# Patient Record
Sex: Female | Born: 1985
Health system: Southern US, Community
[De-identification: ages and names within clinical notes are randomized; demographics above are authoritative.]

## PROBLEM LIST (undated history)

## (undated) ENCOUNTER — Inpatient Hospital Stay (HOSPITAL_COMMUNITY): Payer: Self-pay

## (undated) DIAGNOSIS — Z319 Encounter for procreative management, unspecified: Secondary | ICD-10-CM

## (undated) DIAGNOSIS — T7840XA Allergy, unspecified, initial encounter: Secondary | ICD-10-CM

## (undated) DIAGNOSIS — R51 Headache: Secondary | ICD-10-CM

## (undated) DIAGNOSIS — R519 Headache, unspecified: Secondary | ICD-10-CM

## (undated) DIAGNOSIS — J019 Acute sinusitis, unspecified: Secondary | ICD-10-CM

## (undated) DIAGNOSIS — E282 Polycystic ovarian syndrome: Secondary | ICD-10-CM

## (undated) DIAGNOSIS — E785 Hyperlipidemia, unspecified: Secondary | ICD-10-CM

## (undated) DIAGNOSIS — M722 Plantar fascial fibromatosis: Secondary | ICD-10-CM

## (undated) DIAGNOSIS — J3089 Other allergic rhinitis: Secondary | ICD-10-CM

## (undated) HISTORY — DX: Hyperlipidemia, unspecified: E78.5

## (undated) HISTORY — DX: Allergy, unspecified, initial encounter: T78.40XA

## (undated) HISTORY — PX: WISDOM TOOTH EXTRACTION: SHX21

## (undated) HISTORY — DX: Polycystic ovarian syndrome: E28.2

## (undated) HISTORY — DX: Encounter for procreative management, unspecified: Z31.9

---
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
## 2014-01-28 ENCOUNTER — Ambulatory Visit (INDEPENDENT_AMBULATORY_CARE_PROVIDER_SITE_OTHER): Payer: Self-pay | Admitting: Sports Medicine

## 2014-01-28 ENCOUNTER — Encounter: Payer: Self-pay | Admitting: Sports Medicine

## 2014-01-28 VITALS — BP 127/73 | HR 105 | Ht 62.0 in | Wt 157.0 lb

## 2014-01-28 DIAGNOSIS — M722 Plantar fascial fibromatosis: Secondary | ICD-10-CM

## 2014-01-28 HISTORY — DX: Plantar fascial fibromatosis: M72.2

## 2014-01-28 NOTE — Progress Notes (Signed)
  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 She has had plantar fascia injections in the past that have been minimally effective, she has really never had any rehabilitation, or cost orthotics. Pain is moderate, persistent.  Past medical history, Surgical history, Family history not pertinant except as noted below, Social history, Allergies, and medications have been entered into the medical record, reviewed, and no changes needed.   Review of Systems: No headache, visual changes, nausea, vomiting, diarrhea, constipation, dizziness, abdominal pain, skin rash, fevers, chills, night sweats, swollen lymph nodes, weight loss, chest pain, body aches, joint swelling, muscle aches, shortness of breath, mood changes, visual or auditory hallucinations.  Objective:    General: Well Developed, well nourished, and in no acute distress.  Neuro: Alert and oriented x3, extra-ocular muscles intact, sensation grossly intact.  HEENT: Normocephalic, atraumatic, pupils equal round reactive to light, neck supple, no masses, no lymphadenopathy, thyroid nonpalpable.  Skin: Warm and dry, no rashes noted.  Cardiac: Regular rate and rhythm, no murmurs rubs or gallops.  Respiratory: Clear to auscultation bilaterally. Not using accessory muscles, speaking in full sentences.  Abdominal: Soft, nontender, nondistended, positive bowel sounds, no masses, no organomegaly.  Bilateral Foot: No visible erythema or swelling. Range of motion is full in all directions. Strength is 5/5 in all directions. No hallux valgus. No pes cavus or pes planus. No abnormal callus noted. No pain over the navicular prominence, or base of fifth metatarsal. Tender to palpation of the calcaneal insertion of plantar  fascia. No pain at the Achilles insertion. No pain over the calcaneal bursa. No pain of the retrocalcaneal bursa. No tenderness to palpation over the tarsals, metatarsals, or phalanges. No hallux rigidus or limitus. No tenderness palpation over interphalangeal joints. No pain with compression of the metatarsal heads. Neurovascularly intact distally.  Patient was fitted for a : standard, cushioned, semi-rigid orthotic. The orthotic was heated and afterward the patient stood on the orthotic blank positioned on the orthotic stand. The patient was positioned in subtalar neutral position and 10 degrees of ankle dorsiflexion in a weight bearing stance. After completion of molding, a stable base was applied to the orthotic blank. The blank was ground to a stable position for weight bearing. Size: 6 Base: Blue EVA Additional Posting and Padding: None The patient ambulated these, and they were very comfortable.  Impression and Recommendations:    The patient was counselled, risk factors were discussed, anticipatory guidance given.

## 2014-01-28 NOTE — Assessment & Plan Note (Signed)
Orthotics as above, over-the-counter NSAIDs as needed, or rehabilitation given. Return as needed.

## 2014-04-07 DIAGNOSIS — E669 Obesity, unspecified: Secondary | ICD-10-CM | POA: Insufficient documentation

## 2014-04-07 DIAGNOSIS — L509 Urticaria, unspecified: Secondary | ICD-10-CM | POA: Insufficient documentation

## 2014-04-07 DIAGNOSIS — E66811 Obesity, class 1: Secondary | ICD-10-CM | POA: Insufficient documentation

## 2014-09-08 ENCOUNTER — Ambulatory Visit (INDEPENDENT_AMBULATORY_CARE_PROVIDER_SITE_OTHER): Payer: 59 | Admitting: Podiatry

## 2014-09-08 ENCOUNTER — Ambulatory Visit (INDEPENDENT_AMBULATORY_CARE_PROVIDER_SITE_OTHER): Payer: 59

## 2014-09-08 ENCOUNTER — Encounter: Payer: Self-pay | Admitting: Podiatry

## 2014-09-08 VITALS — BP 124/80 | HR 74 | Resp 12

## 2014-09-08 DIAGNOSIS — M722 Plantar fascial fibromatosis: Secondary | ICD-10-CM

## 2014-09-08 MED ORDER — DICLOFENAC SODIUM 75 MG PO TBEC: 75.0000 mg | DELAYED_RELEASE_TABLET | Freq: Two times a day (BID) | ORAL | Status: DC

## 2014-09-08 MED ORDER — TRIAMCINOLONE ACETONIDE 10 MG/ML IJ SUSP
10.0000 mg | Freq: Once | INTRAMUSCULAR | Status: AC
  Administered 2014-09-08: 10 mg

## 2014-09-08 NOTE — Progress Notes (Signed)
Subjective:     Patient ID: Dawn Wu, female   DOB: 02-Feb-1986, 28 y.o.   MRN: [id number]  HPIpatient states that she's had a lot of pain in both of her heels for several years with the right being worse and now the left one is really starting to bother her. She states she's unable to do activities once she leaves work and that this has been a big impact on her life style   Review of Systems  All other systems reviewed and are negative.      Objective:   Physical Exam  Constitutional: She is oriented to person, place, and time.  Cardiovascular: Intact distal pulses.   Musculoskeletal: Normal range of motion.  Neurological: She is oriented to person, place, and time.  Skin: Skin is warm.  Nursing note and vitals reviewed. neurovascular status found to be intact with muscle strength adequate and range of motion subtalar and midtarsal joint within normal limits. Patient is noted to have significant discomfort in the medial fascial band of both heels at the insertion of the tendon into the calcaneus with fluid buildup noted and moderate depression of the arch upon weightbearing. Digits are well-perfused and no equinus condition is noted     Assessment:     Significant plantar fasciitis-like symptoms in a young girl who has had 1 previous injection which helped for a period of time    Plan:     H&P and x-rays reviewed and today I injected the plantar fascia at each heel 3 mg Kenalog 5 mg Xylocaine and applied fascially brace right and placed on diclofenac 75 mg twice a day. Scanned for custom orthotics to reduce the chronic nature of her condition due to the long-term nature of problem

## 2014-09-08 NOTE — Progress Notes (Signed)
   Subjective:    Patient ID: Dawn Wu, female    DOB: 08/15/1986, 28 y.o.   MRN: [id number]  HPI Comments: Pt states her heels have hurt for 2 years, and 15 months she received an injection in the right heel.  Pt states she now has painfulness in both heels.     Review of Systems  Genitourinary:       Irregular menses.  All other systems reviewed and are negative.      Objective:   Physical Exam        Assessment & Plan:

## 2014-09-08 NOTE — Patient Instructions (Signed)

## 2014-09-12 ENCOUNTER — Ambulatory Visit: Payer: Self-pay | Admitting: Podiatry

## 2014-10-07 ENCOUNTER — Other Ambulatory Visit: Payer: 59

## 2014-10-14 ENCOUNTER — Ambulatory Visit: Payer: 59

## 2014-10-14 DIAGNOSIS — M722 Plantar fascial fibromatosis: Secondary | ICD-10-CM

## 2014-10-14 NOTE — Patient Instructions (Signed)

## 2014-10-14 NOTE — Progress Notes (Signed)
Pt is here to PUO 

## 2015-04-07 ENCOUNTER — Ambulatory Visit (INDEPENDENT_AMBULATORY_CARE_PROVIDER_SITE_OTHER): Payer: 59 | Admitting: Podiatry

## 2015-04-07 ENCOUNTER — Encounter: Payer: Self-pay | Admitting: Podiatry

## 2015-04-07 VITALS — BP 116/69 | HR 76 | Resp 15

## 2015-04-07 DIAGNOSIS — M722 Plantar fascial fibromatosis: Secondary | ICD-10-CM

## 2015-04-07 MED ORDER — TRIAMCINOLONE ACETONIDE 10 MG/ML IJ SUSP
10.0000 mg | Freq: Once | INTRAMUSCULAR | Status: AC
  Administered 2015-04-07: 10 mg

## 2015-04-07 NOTE — Progress Notes (Signed)
Subjective:     Patient ID: Dawn Wu, female   DOB: Jul 01, 1986, 29 y.o.   MRN: [id number]  HPI patient states I was doing well for around 4 months and then started to develop reoccurrence of my pain in my left heel. Patient states that she started running and it started to hurt and she is only been wearing orthotics at times due to making her feet achy at different times   Review of Systems     Objective:   Physical Exam Neurovascular status intact muscle strength is adequate with discomfort in the plantar heel medial band that's present especially after periods of sitting and when getting up in the morning    Assessment:     Reoccurrence plantar fasciitis left with inflammation and fluid buildup around the medial band    Plan:     Instructed on continued physical therapy and the importance of orthotics and reinjected the plantar fascial left 3 mg Kenalog 5 mg Xylocaine and went ahead and applied night splint with all instructions on usage

## 2015-04-19 ENCOUNTER — Encounter: Payer: Self-pay | Admitting: Dietician

## 2015-04-19 ENCOUNTER — Encounter: Payer: 59 | Attending: *Deleted | Admitting: Dietician

## 2015-04-19 VITALS — Ht 62.0 in | Wt 158.9 lb

## 2015-04-19 DIAGNOSIS — Z713 Dietary counseling and surveillance: Secondary | ICD-10-CM | POA: Insufficient documentation

## 2015-04-19 DIAGNOSIS — E663 Overweight: Secondary | ICD-10-CM | POA: Diagnosis present

## 2015-04-19 NOTE — Patient Instructions (Signed)
Aim to eat 3 meals per day and have 1-2 snacks if you are hungry. Have protein with a carbohydrate for each meal/snack. Eat meals at the table. Try to take 20 minutes to eat, chew about 20-30 x per bite, put your fork down, and consider switching hand to slow down.  If you are still hungry after 20 minutes, have seconds. Aim to fill half of your plate with vegetables at lunch and dinner meals. Have protein the size of the palm of your hand and starch on a quarter of your plate or what you can fit in your hand. At restaurants, choose one starch. Box up half of your portion in a to go box.

## 2015-04-19 NOTE — Progress Notes (Signed)
  Medical Nutrition Therapy:  Appt start time: 1655 end time:  1730.   Assessment:  Primary concerns today: Dawn Wu is here today since she has been trying to get healthier and would like to get her [facility] Weight Badge. Has started exercising, cut back on calories and having some supplemental shakes 5 x week. Was eating 3-4 meals per day that were heavy and had a lot of tortillas.   Has lost 11 lbs in the past 3 weeks. Would like to get weight down to 150 lbs. Ob/gyn would like her to lose weight since she had PCOS and would like to have a baby in the next year.  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 Lives with her parents. Her and her mom do the meal preparation at home. Not missing any meals. Eating out 1-2 meals per week.   Preferred Learning Style:   No preference indicated   Learning Readiness:   Ready  MEDICATIONS: see list    DIETARY INTAKE:  Usual eating pattern includes 2-3 meals and 0-1 snacks per day.  Avoided foods include: shellfish, raw onions/tomatoes   24-hr recall:  B ( AM): Shake (Usana) with water   Snk ( AM): might have apple or orange or almonds  L ( PM): grilled chicken or burger or chicken stew with tortillas Snk ( PM): none D ( PM): Shake (Usana) with water or chicken, meat with beans or a salad or skips Snk ( PM): none Beverages: shakes, water, less than 1 x week sweet tea or soda  Usual physical activity: Zumba 3-4 x week  Estimated energy needs: 2000 calories 225 g carbohydrates 150 g protein 56 g fat  Progress Towards Goal(s):  In progress.   Nutritional Diagnosis:  [place]-3.3 Overweight/obesity As related to hx of large portion sizes and excess carbohydrates.  As evidenced by BMI of 29.1.    Intervention:  Nutrition counseling provided. Plan: Aim to eat 3 meals per day and have 1-2 snacks if you are hungry. Have protein with a carbohydrate for each meal/snack. Eat meals at the table. Try to take 20 minutes to eat, chew about 20-30 x per  bite, put your fork down, and consider switching hand to slow down.  If you are still hungry after 20 minutes, have seconds. Aim to fill half of your plate with vegetables at lunch and dinner meals. Have protein the size of the palm of your hand and starch on a quarter of your plate or what you can fit in your hand. At restaurants, choose one starch. Box up half of your portion in a to go box.  Teaching Method Utilized:  Visual Auditory Hands on  Handouts given during visit include:  MyPlate Handout  15 g CHO Snacks  Barriers to learning/adherence to lifestyle change: none  Demonstrated degree of understanding via:  Teach Back   Monitoring/Evaluation:  Dietary intake, exercise, and body weight prn.

## 2015-07-15 DIAGNOSIS — J309 Allergic rhinitis, unspecified: Principal | ICD-10-CM

## 2015-07-15 DIAGNOSIS — H1010 Acute atopic conjunctivitis, unspecified eye: Secondary | ICD-10-CM | POA: Insufficient documentation

## 2015-08-09 ENCOUNTER — Ambulatory Visit (INDEPENDENT_AMBULATORY_CARE_PROVIDER_SITE_OTHER): Payer: 59

## 2015-08-09 DIAGNOSIS — J301 Allergic rhinitis due to pollen: Secondary | ICD-10-CM

## 2015-10-25 ENCOUNTER — Encounter: Payer: Self-pay | Admitting: Podiatry

## 2015-10-25 ENCOUNTER — Encounter (INDEPENDENT_AMBULATORY_CARE_PROVIDER_SITE_OTHER): Payer: Self-pay

## 2015-10-25 ENCOUNTER — Encounter: Payer: Self-pay | Admitting: Allergy and Immunology

## 2015-11-05 NOTE — L&D Delivery Note (Signed)
Delivery Note [age] y.o. G1P0 at 7449w5d who presented in labor. Augmented with AROM and pitocin. No intrapartum complications.  At 4:12 PM a viable female was delivered via Vaginal, Spontaneous Delivery (Presentation:cephalic;ROA  ).  APGAR: 8, 9; weight 7 lb 6.2 oz (3350 g).  Had loose nuchal cord x 1. Placenta status: delivered spontaneously, intact.   Anesthesia:  Epidural Episiotomy:  None Lacerations: 2nd degree perineal and R periurethral repaired in usual fashion with 2-0 Vircyl and 3-0 Vicryl respectively.  Est. Blood Loss (mL): 200  Mom to postpartum.  Baby to Couplet care / Skin to Skin.   Jaynie CollinsUGONNA  Rufino Staup, MD, FACOG Attending Obstetrician & Gynecologist, [facility], [facility]

## 2015-11-13 DIAGNOSIS — N926 Irregular menstruation, unspecified: Secondary | ICD-10-CM | POA: Diagnosis not present

## 2015-11-16 DIAGNOSIS — Z3201 Encounter for pregnancy test, result positive: Secondary | ICD-10-CM | POA: Diagnosis not present

## 2015-12-01 DIAGNOSIS — Z36 Encounter for antenatal screening of mother: Secondary | ICD-10-CM | POA: Diagnosis not present

## 2015-12-13 DIAGNOSIS — Z3401 Encounter for supervision of normal first pregnancy, first trimester: Secondary | ICD-10-CM | POA: Diagnosis not present

## 2015-12-15 LAB — OB RESULTS CONSOLE RUBELLA ANTIBODY, IGM: Rubella: IMMUNE

## 2015-12-15 LAB — OB RESULTS CONSOLE ANTIBODY SCREEN: ANTIBODY SCREEN: NEGATIVE

## 2015-12-15 LAB — OB RESULTS CONSOLE PLATELET COUNT: PLATELETS: 280 10*3/uL

## 2015-12-15 LAB — OB RESULTS CONSOLE ABO/RH: RH Type: POSITIVE

## 2015-12-15 LAB — OB RESULTS CONSOLE HGB/HCT, BLOOD
HCT: 40 %
Hemoglobin: 13.3 g/dL

## 2015-12-15 LAB — OB RESULTS CONSOLE HEPATITIS B SURFACE ANTIGEN: Hepatitis B Surface Ag: NEGATIVE

## 2015-12-15 LAB — OB RESULTS CONSOLE RPR: RPR: NONREACTIVE

## 2015-12-15 LAB — OB RESULTS CONSOLE VARICELLA ZOSTER ANTIBODY, IGG: VARICELLA IGG: IMMUNE

## 2015-12-15 LAB — OB RESULTS CONSOLE HIV ANTIBODY (ROUTINE TESTING): HIV: NONREACTIVE

## 2015-12-27 DIAGNOSIS — Z3490 Encounter for supervision of normal pregnancy, unspecified, unspecified trimester: Secondary | ICD-10-CM | POA: Diagnosis not present

## 2015-12-27 DIAGNOSIS — Z01419 Encounter for gynecological examination (general) (routine) without abnormal findings: Secondary | ICD-10-CM | POA: Diagnosis not present

## 2015-12-28 DIAGNOSIS — R87610 Atypical squamous cells of undetermined significance on cytologic smear of cervix (ASC-US): Secondary | ICD-10-CM | POA: Diagnosis not present

## 2015-12-28 DIAGNOSIS — Z01419 Encounter for gynecological examination (general) (routine) without abnormal findings: Secondary | ICD-10-CM | POA: Diagnosis not present

## 2015-12-28 LAB — IGP,CTNGTV,RFX APTIMA HPV ASCU
Chlamydia, Nuc. Acid Amp: NEGATIVE
GONOCOCCUS, NUC. ACID AMP: NEGATIVE
HPV Aptima: NEGATIVE

## 2016-01-08 DIAGNOSIS — Z36 Encounter for antenatal screening of mother: Secondary | ICD-10-CM | POA: Diagnosis not present

## 2016-01-22 ENCOUNTER — Encounter: Payer: Self-pay | Admitting: Advanced Practice Midwife

## 2016-01-22 ENCOUNTER — Encounter: Payer: Self-pay | Admitting: *Deleted

## 2016-01-22 ENCOUNTER — Ambulatory Visit (INDEPENDENT_AMBULATORY_CARE_PROVIDER_SITE_OTHER): Payer: 59 | Admitting: Advanced Practice Midwife

## 2016-01-22 VITALS — BP 131/71 | HR 97 | Wt 168.0 lb

## 2016-01-22 DIAGNOSIS — R87610 Atypical squamous cells of undetermined significance on cytologic smear of cervix (ASC-US): Secondary | ICD-10-CM | POA: Insufficient documentation

## 2016-01-22 DIAGNOSIS — Z3482 Encounter for supervision of other normal pregnancy, second trimester: Secondary | ICD-10-CM

## 2016-01-22 DIAGNOSIS — Z36 Encounter for antenatal screening of mother: Secondary | ICD-10-CM

## 2016-01-22 DIAGNOSIS — Z363 Encounter for antenatal screening for malformations: Secondary | ICD-10-CM

## 2016-01-22 NOTE — Progress Notes (Signed)
Last pap 2/17 Ascus.

## 2016-01-22 NOTE — Patient Instructions (Signed)

## 2016-01-22 NOTE — Progress Notes (Signed)
Subjective:  Dawn Wu is a 30 y.o. G1P0 at 7140w0d being seen today for NOB transfer from [place].  Pt is a Producer, [employer] employee.  She is currently monitored for the following issues for this low-risk pregnancy and has Plantar fasciitis; Allergic conjunctivitis and rhinitis; Supervision of normal pregnancy, antepartum; Class 1 obesity; and Atypical squamous cells of undetermined significance (ASCUS) on Papanicolaou smear of cervix on her problem list.  Patient reports Daily allergy Sx and associated morning HA's.  Used to use Nasocort w/ good relief, but stopped sue to pregnancy.   . Vag. Bleeding: None.   . Denies leaking of fluid.   The following portions of the patient's history were reviewed and updated as appropriate: allergies, current medications, past family history, past medical history, past social history, past surgical history and problem list. Problem list updated.  FH diabetes  Objective:   Filed Vitals:   01/22/16 0843  BP: 131/71  Pulse: 97  Weight: 168 lb (76.204 kg)    Fetal Status: Fetal Heart Rate (bpm): 145         General:  Alert, oriented and cooperative. Patient is in no acute distress.  Skin: Skin is warm and dry. No rash noted.   Cardiovascular: Normal heart rate noted  Respiratory: Normal respiratory effort, no problems with respiration noted  Abdomen: Soft, gravid, appropriate for gestational age.       Pelvic: Vag. Bleeding: None Vag D/C Character: Thin   Cervical exam deferred        Extremities: Normal range of motion.     Mental Status: Normal mood and affect. Normal behavior. Normal judgment and thought content.   Urinalysis: Urine Protein: Negative Urine Glucose: 1+  Assessment and Plan:  Pregnancy: G1P0 at 6840w0d  1. Supervision of normal pregnancy, antepartum, second trimester  - US MFM OB COMP + 14 WK; Future  2. Atypical squamous cells of undetermined significance (ASCUS) on Papanicolaou smear of cervix - Routine screening  3. Antenatal  screening for malformation using ultrasonics  - US MFM OB COMP + 14 WK; Future  4. Glycosuria -Early 1 hour GTT  Preterm labor symptoms and general obstetric precautions including but not limited to vaginal bleeding, contractions, leaking of fluid and fetal movement were reviewed in detail with the patient. Please refer to After Visit Summary for other counseling recommendations.  Return in about 4 weeks (around 02/19/2016).   Dorathy KinsmanVirginia Welton Bord, CNM

## 2016-02-07 DIAGNOSIS — L723 Sebaceous cyst: Secondary | ICD-10-CM | POA: Diagnosis not present

## 2016-02-07 MED FILL — CLINDAMYCIN-BENZOYL PEROX 1: 1-5 | 30 days supply | Qty: 25 | Fill #0

## 2016-02-23 ENCOUNTER — Encounter: Payer: 59 | Admitting: Family

## 2016-02-23 ENCOUNTER — Ambulatory Visit (INDEPENDENT_AMBULATORY_CARE_PROVIDER_SITE_OTHER): Payer: 59 | Admitting: Family

## 2016-02-23 VITALS — BP 114/74 | HR 88 | Wt 168.0 lb

## 2016-02-23 DIAGNOSIS — Z36 Encounter for antenatal screening of mother: Secondary | ICD-10-CM

## 2016-02-23 DIAGNOSIS — Z3490 Encounter for supervision of normal pregnancy, unspecified, unspecified trimester: Secondary | ICD-10-CM | POA: Diagnosis not present

## 2016-02-23 DIAGNOSIS — Z3480 Encounter for supervision of other normal pregnancy, unspecified trimester: Secondary | ICD-10-CM | POA: Diagnosis not present

## 2016-02-23 DIAGNOSIS — Z3402 Encounter for supervision of normal first pregnancy, second trimester: Secondary | ICD-10-CM

## 2016-02-23 DIAGNOSIS — Z3482 Encounter for supervision of other normal pregnancy, second trimester: Secondary | ICD-10-CM

## 2016-02-23 NOTE — Progress Notes (Signed)
Subjective:  Dawn Wu is a 30 y.o. G1P0 at 465w4d being seen today for ongoing prenatal care.  She is currently monitored for the following issues for this low-risk pregnancy and has Plantar fasciitis; Allergic conjunctivitis and rhinitis; Supervision of normal pregnancy, antepartum; Class 1 obesity; and Atypical squamous cells of undetermined significance (ASCUS) on Papanicolaou smear of cervix on her problem list.  Patient reports intermittent lower back pain while standing at work.  no report of UTI symptoms.  .   Lockie Pares. Vag. Bleeding: None.  Movement: Absent. Denies leaking of fluid.   The following portions of the patient's history were reviewed and updated as appropriate: allergies, current medications, past family history, past medical history, past social history, past surgical history and problem list. Problem list updated.  Objective:   Filed Vitals:   02/23/16 0820  BP: 114/74  Pulse: 88  Weight: 168 lb (76.204 kg)    Fetal Status: Fetal Heart Rate (bpm): 143   Movement: Absent   Fundal height 19  General:  Alert, oriented and cooperative. Patient is in no acute distress.  Skin: Skin is warm and dry. No rash noted.   Cardiovascular: Normal heart rate noted  Respiratory: Normal respiratory effort, no problems with respiration noted  Abdomen: Soft, gravid, appropriate for gestational age. Pain/Pressure: Absent     Pelvic: Vag. Bleeding: None Vag D/C Character: Thin   Cervical exam deferred        Extremities: Normal range of motion.     Mental Status: Normal mood and affect. Normal behavior. Normal judgment and thought content.   Urinalysis: Urine Protein: Negative Urine Glucose: Trace  Assessment and Plan:  Pregnancy: G1P0 at 5565w4d  1. Prenatal care, unspecified trimester - Glucose Tolerance, 1 HR (50g) - Alpha fetoprotein, maternal - Anatomy ultrasound scheduled for Monday  2.  Back Pain in Pregnancy - Discussed use of pregnancy band - OTC Tylenol as  needed  General obstetric precautions including but not limited to vaginal bleeding and pelvic pain reviewed in detail with the patient. Please refer to After Visit Summary for other counseling recommendations.  Return in about 4 weeks (around 03/22/2016).   Eino FarberWalidah Kennith GainN Karim, CNM

## 2016-02-23 NOTE — Progress Notes (Signed)
Patient doing early glucola today. Armandina StammerJennifer Lacreshia Bondarenko RN BSN

## 2016-02-24 LAB — GLUCOSE TOLERANCE, 1 HOUR (50G) W/O FASTING: GLUCOSE, 1 HR, GESTATIONAL: 133 mg/dL (ref <140)

## 2016-02-26 ENCOUNTER — Encounter: Payer: 59 | Admitting: Advanced Practice Midwife

## 2016-02-26 ENCOUNTER — Other Ambulatory Visit: Payer: Self-pay | Admitting: Advanced Practice Midwife

## 2016-02-26 ENCOUNTER — Ambulatory Visit (HOSPITAL_COMMUNITY)
Admission: RE | Admit: 2016-02-26 | Discharge: 2016-02-26 | Disposition: A | Discharge status: Home / Self Care | Payer: 59 | Source: Ambulatory Visit | Attending: Advanced Practice Midwife | Admitting: Advanced Practice Midwife

## 2016-02-26 ENCOUNTER — Telehealth: Payer: Self-pay | Admitting: *Deleted

## 2016-02-26 DIAGNOSIS — Z363 Encounter for antenatal screening for malformations: Secondary | ICD-10-CM

## 2016-02-26 DIAGNOSIS — Z3482 Encounter for supervision of other normal pregnancy, second trimester: Secondary | ICD-10-CM

## 2016-02-26 DIAGNOSIS — Z3A19 19 weeks gestation of pregnancy: Secondary | ICD-10-CM | POA: Insufficient documentation

## 2016-02-26 DIAGNOSIS — Z36 Encounter for antenatal screening of mother: Secondary | ICD-10-CM | POA: Diagnosis not present

## 2016-02-26 NOTE — Telephone Encounter (Signed)
LM on voicemail of normal early 1 hr GTT of 133

## 2016-02-27 ENCOUNTER — Telehealth: Payer: Self-pay | Admitting: *Deleted

## 2016-02-27 DIAGNOSIS — Z3482 Encounter for supervision of other normal pregnancy, second trimester: Secondary | ICD-10-CM

## 2016-02-27 LAB — ALPHA FETOPROTEIN, MATERNAL
AFP: 31.5 ng/mL
Curr Gest Age: 18.6 weeks
MoM for AFP: 0.76
OPEN SPINA BIFIDA: NEGATIVE

## 2016-02-27 NOTE — Telephone Encounter (Signed)
Pt notified of normal AFP. 

## 2016-03-04 MED FILL — EPINEPHRINE 0.3 MG AUTO-INJ: 0.3 | 1 days supply | Qty: 2 | Fill #1

## 2016-03-20 ENCOUNTER — Ambulatory Visit (INDEPENDENT_AMBULATORY_CARE_PROVIDER_SITE_OTHER): Payer: 59 | Admitting: Obstetrics & Gynecology

## 2016-03-20 VITALS — BP 109/72 | HR 94 | Wt 175.0 lb

## 2016-03-20 DIAGNOSIS — R87610 Atypical squamous cells of undetermined significance on cytologic smear of cervix (ASC-US): Secondary | ICD-10-CM

## 2016-03-20 DIAGNOSIS — E669 Obesity, unspecified: Secondary | ICD-10-CM

## 2016-03-20 DIAGNOSIS — Z3482 Encounter for supervision of other normal pregnancy, second trimester: Secondary | ICD-10-CM

## 2016-03-20 NOTE — Progress Notes (Signed)
Patient complaining of back and abdominal pain that started yesterday. Armandina StammerJennifer Vianey Caniglia RN BSN

## 2016-03-20 NOTE — Progress Notes (Signed)
Subjective:  Dawn Wu is a 30 y.o. G1P0 at 3031w2d being seen today for ongoing prenatal care.  She is currently monitored for the following issues for this low-risk pregnancy and has Plantar fasciitis; Allergic conjunctivitis and rhinitis; Supervision of normal pregnancy, antepartum; Class 1 obesity; and Atypical squamous cells of undetermined significance (ASCUS) on Papanicolaou smear of cervix on her problem list.  Patient reports round ligament pain.   . Vag. Bleeding: None.  Movement: Present. Denies leaking of fluid.   The following portions of the patient's history were reviewed and updated as appropriate: allergies, current medications, past family history, past medical history, past social history, past surgical history and problem list. Problem list updated.  Objective:   Filed Vitals:   03/20/16 1043  BP: 109/72  Pulse: 94  Weight: 175 lb (79.379 kg)    Fetal Status: Fetal Heart Rate (bpm): 135   Movement: Present     General:  Alert, oriented and cooperative. Patient is in no acute distress.  Skin: Skin is warm and dry. No rash noted.   Cardiovascular: Normal heart rate noted  Respiratory: Normal respiratory effort, no problems with respiration noted  Abdomen: Soft, gravid, appropriate for gestational age. Pain/Pressure: Present     Pelvic: Vag. Bleeding: None Vag D/C Character: Thin   Cervical exam deferred        Extremities: Normal range of motion.  Edema: None  Mental Status: Normal mood and affect. Normal behavior. Normal judgment and thought content.   Urinalysis: Urine Protein: Trace Urine Glucose: Negative  Assessment and Plan:  Pregnancy: G1P0 at 5931w2d  1. Atypical squamous cells of undetermined significance (ASCUS) on Papanicolaou smear of cervix   2. Supervision of normal pregnancy, antepartum, second trimester  - US MFM OB FOLLOW UP; Future  3. Class 1 obesity - repeat glucola at next visit  Preterm labor symptoms and general obstetric  precautions including but not limited to vaginal bleeding, contractions, leaking of fluid and fetal movement were reviewed in detail with the patient. Please refer to After Visit Summary for other counseling recommendations.   Return in about 4 weeks (around 04/17/2016) for glucola.   Allie BossierMyra C Jozlin Bently, MD

## 2016-03-22 ENCOUNTER — Encounter: Payer: 59 | Admitting: Family

## 2016-04-15 ENCOUNTER — Other Ambulatory Visit: Payer: Self-pay | Admitting: Obstetrics & Gynecology

## 2016-04-15 ENCOUNTER — Ambulatory Visit (HOSPITAL_COMMUNITY)
Admission: RE | Admit: 2016-04-15 | Discharge: 2016-04-15 | Disposition: A | Discharge status: Home / Self Care | Payer: 59 | Source: Ambulatory Visit | Attending: Obstetrics & Gynecology | Admitting: Obstetrics & Gynecology

## 2016-04-15 DIAGNOSIS — Z3A26 26 weeks gestation of pregnancy: Secondary | ICD-10-CM

## 2016-04-15 DIAGNOSIS — Z0489 Encounter for examination and observation for other specified reasons: Secondary | ICD-10-CM

## 2016-04-15 DIAGNOSIS — IMO0002 Reserved for concepts with insufficient information to code with codable children: Secondary | ICD-10-CM

## 2016-04-15 DIAGNOSIS — Z36 Encounter for antenatal screening of mother: Secondary | ICD-10-CM | POA: Insufficient documentation

## 2016-04-15 DIAGNOSIS — Z3482 Encounter for supervision of other normal pregnancy, second trimester: Secondary | ICD-10-CM

## 2016-04-16 ENCOUNTER — Ambulatory Visit (HOSPITAL_COMMUNITY): Payer: 59

## 2016-04-17 ENCOUNTER — Ambulatory Visit (INDEPENDENT_AMBULATORY_CARE_PROVIDER_SITE_OTHER): Payer: 59 | Admitting: Obstetrics and Gynecology

## 2016-04-17 ENCOUNTER — Encounter: Payer: Self-pay | Admitting: Obstetrics and Gynecology

## 2016-04-17 VITALS — BP 124/70 | HR 103 | Wt 178.0 lb

## 2016-04-17 DIAGNOSIS — Z23 Encounter for immunization: Secondary | ICD-10-CM

## 2016-04-17 DIAGNOSIS — Z36 Encounter for antenatal screening of mother: Secondary | ICD-10-CM

## 2016-04-17 DIAGNOSIS — Z3482 Encounter for supervision of other normal pregnancy, second trimester: Secondary | ICD-10-CM

## 2016-04-17 DIAGNOSIS — Z3492 Encounter for supervision of normal pregnancy, unspecified, second trimester: Secondary | ICD-10-CM

## 2016-04-17 DIAGNOSIS — Z3480 Encounter for supervision of other normal pregnancy, unspecified trimester: Secondary | ICD-10-CM | POA: Diagnosis not present

## 2016-04-17 LAB — CBC
HCT: 37.8 % (ref 35.0–45.0)
HEMOGLOBIN: 12.2 g/dL (ref 11.7–15.5)
MCH: 26.6 pg — AB (ref 27.0–33.0)
MCHC: 32.3 g/dL (ref 32.0–36.0)
MCV: 82.5 fL (ref 80.0–100.0)
MPV: 11.1 fL (ref 7.5–12.5)
Platelets: 203 10*3/uL (ref 140–400)
RBC: 4.58 MIL/uL (ref 3.80–5.10)
RDW: 14.2 % (ref 11.0–15.0)
WBC: 10.1 10*3/uL (ref 3.8–10.8)

## 2016-04-17 NOTE — Progress Notes (Signed)
Subjective:  Dawn Wu is a 30 y.o. G1P0 at 2223w2d being seen today for ongoing prenatal care.  She is currently monitored for the following issues for this low-risk pregnancy and has Plantar fasciitis; Allergic conjunctivitis and rhinitis; Supervision of normal pregnancy, antepartum; Class 1 obesity; and Atypical squamous cells of undetermined significance (ASCUS) on Papanicolaou smear of cervix on her problem list.  Patient reports no complaints.  Contractions: Not present. Vag. Bleeding: None.  Movement: Present. Denies leaking of fluid.   The following portions of the patient's history were reviewed and updated as appropriate: allergies, current medications, past family history, past medical history, past social history, past surgical history and problem list. Problem list updated.  Objective:   Filed Vitals:   04/17/16 0811  BP: 124/70  Pulse: 103  Weight: 178 lb (80.74 kg)    Fetal Status: Fetal Heart Rate (bpm): 141 Fundal Height: 27 cm Movement: Present     General:  Alert, oriented and cooperative. Patient is in no acute distress.  Skin: Skin is warm and dry. No rash noted.   Cardiovascular: Normal heart rate noted  Respiratory: Normal respiratory effort, no problems with respiration noted  Abdomen: Soft, gravid, appropriate for gestational age. Pain/Pressure: Absent     Pelvic: Cervical exam deferred        Extremities: Normal range of motion.  Edema: Trace  Mental Status: Normal mood and affect. Normal behavior. Normal judgment and thought content.   Urinalysis: Urine Protein: Trace Urine Glucose: Negative  Assessment and Plan:  Pregnancy: G1P0 at 3823w2d  1. Supervision of normal pregnancy, antepartum, second trimester Patient is doing well without complaints. Reviewed 04/15/16 ultrasound results  Third trimester labs today - Glucose Tolerance, 1 HR (50g) - RPR - HIV antibody (with reflex) - CBC - Tdap vaccine greater than or equal to 7yo IM  2. Normal  pregnancy, second trimester  - Glucose Tolerance, 1 HR (50g) - RPR - HIV antibody (with reflex) - CBC - Tdap vaccine greater than or equal to 7yo IM  Preterm labor symptoms and general obstetric precautions including but not limited to vaginal bleeding, contractions, leaking of fluid and fetal movement were reviewed in detail with the patient. Please refer to After Visit Summary for other counseling recommendations.  Return in about 2 weeks (around 05/01/2016).   Catalina AntiguaPeggy Chazlyn Cude, MD

## 2016-04-18 LAB — RPR

## 2016-04-18 LAB — HIV ANTIBODY (ROUTINE TESTING W REFLEX): HIV: NONREACTIVE

## 2016-04-20 LAB — GLUCOSE TOLERANCE, 1 HOUR (50G) W/O FASTING: Glucose, 1 Hr, gestational: 156 mg/dL — ABNORMAL HIGH (ref <140)

## 2016-04-22 ENCOUNTER — Telehealth: Payer: Self-pay | Admitting: *Deleted

## 2016-04-22 DIAGNOSIS — Z3482 Encounter for supervision of other normal pregnancy, second trimester: Secondary | ICD-10-CM

## 2016-04-22 NOTE — Telephone Encounter (Signed)
Pt notified of abnormal 1 hr GTt and she is going to return this week for a fasting 3 hr GTT

## 2016-04-26 ENCOUNTER — Other Ambulatory Visit: Payer: 59

## 2016-04-26 DIAGNOSIS — R7309 Other abnormal glucose: Secondary | ICD-10-CM

## 2016-04-26 DIAGNOSIS — R7302 Impaired glucose tolerance (oral): Secondary | ICD-10-CM | POA: Diagnosis not present

## 2016-04-27 LAB — GLUCOSE TOLERANCE, 3 HOURS
GLUCOSE, 1 HOUR-GESTATIONAL: 174 mg/dL (ref <190)
GLUCOSE, 2 HOUR-GESTATIONAL: 144 mg/dL (ref <165)
GLUCOSE, FASTING-GESTATIONAL: 82 mg/dL (ref 65–104)
Glucose, GTT - 3 Hour: 100 mg/dL (ref <145)

## 2016-04-29 ENCOUNTER — Telehealth: Payer: Self-pay | Admitting: *Deleted

## 2016-04-29 DIAGNOSIS — Z3482 Encounter for supervision of other normal pregnancy, second trimester: Secondary | ICD-10-CM

## 2016-04-29 NOTE — Telephone Encounter (Signed)
Pt notified of normal 3 hr GTT. 

## 2016-05-06 ENCOUNTER — Ambulatory Visit (INDEPENDENT_AMBULATORY_CARE_PROVIDER_SITE_OTHER): Payer: 59 | Admitting: Obstetrics & Gynecology

## 2016-05-06 ENCOUNTER — Encounter: Payer: Self-pay | Admitting: Obstetrics & Gynecology

## 2016-05-06 VITALS — BP 121/80 | HR 100 | Wt 180.0 lb

## 2016-05-06 DIAGNOSIS — Z3483 Encounter for supervision of other normal pregnancy, third trimester: Secondary | ICD-10-CM

## 2016-05-06 NOTE — Patient Instructions (Signed)
Return to clinic for any scheduled appointments or obstetric concerns, or go to [facility] for evaluation  

## 2016-05-06 NOTE — Progress Notes (Signed)
Subjective:  Dawn Wu is a 30 y.o. G1P0 at 3175w0d being seen today for ongoing prenatal care.  She is currently monitored for the following issues for this low-risk pregnancy and has Plantar fasciitis; Allergic conjunctivitis and rhinitis; Supervision of normal pregnancy, antepartum; Class 1 obesity; and Atypical squamous cells of undetermined significance (ASCUS) on Papanicolaou smear of cervix on her problem list.  Patient reports no complaints.  Contractions: Not present. Vag. Bleeding: None.  Movement: Present. Denies leaking of fluid.  Requests letter for recusal from jury duty for a federal case that can last several weeks.  The following portions of the patient's history were reviewed and updated as appropriate: allergies, current medications, past family history, past medical history, past social history, past surgical history and problem list. Problem list updated.  Objective:   Filed Vitals:   05/06/16 1340  BP: 121/80  Pulse: 100  Weight: 180 lb (81.647 kg)    Fetal Status: Fetal Heart Rate (bpm): 134 Fundal Height: 29 cm Movement: Present     General:  Alert, oriented and cooperative. Patient is in no acute distress.  Skin: Skin is warm and dry. No rash noted.   Cardiovascular: Normal heart rate noted  Respiratory: Normal respiratory effort, no problems with respiration noted  Abdomen: Soft, gravid, appropriate for gestational age. Pain/Pressure: Present     Pelvic: Cervical exam deferred        Extremities: Normal range of motion.  Edema: Trace  Mental Status: Normal mood and affect. Normal behavior. Normal judgment and thought content.   Urinalysis: Urine Protein: Trace Urine Glucose: Negative  Assessment and Plan:  Pregnancy: G1P0 at 4175w0d  1. Supervision of normal pregnancy, antepartum, third trimester Jury excuse letter given. Preterm labor symptoms and general obstetric precautions including but not limited to vaginal bleeding, contractions, leaking of fluid  and fetal movement were reviewed in detail with the patient. Please refer to After Visit Summary for other counseling recommendations.  Return in about 2 weeks (around 05/20/2016) for OB Visit.   Tereso NewcomerUgonna A Anyanwu, MD

## 2016-05-13 ENCOUNTER — Other Ambulatory Visit: Payer: Self-pay

## 2016-05-13 MED ORDER — RANITIDINE HCL 150 MG PO TABS: ORAL_TABLET | ORAL | Status: DC

## 2016-05-13 MED FILL — raNITIdine HCL 150 MG TABS: 150 | 90 days supply | Qty: 90 | Fill #0

## 2016-05-20 ENCOUNTER — Ambulatory Visit (INDEPENDENT_AMBULATORY_CARE_PROVIDER_SITE_OTHER): Payer: 59 | Admitting: Advanced Practice Midwife

## 2016-05-20 VITALS — BP 127/72 | HR 104 | Wt 182.0 lb

## 2016-05-20 DIAGNOSIS — Z3483 Encounter for supervision of other normal pregnancy, third trimester: Secondary | ICD-10-CM

## 2016-05-20 NOTE — Patient Instructions (Signed)

## 2016-05-20 NOTE — Progress Notes (Signed)
Subjective:  Dawn Wu is a 30 y.o. G1P0 at 6670w0d being seen today for ongoing prenatal care.  She is currently monitored for the following issues for this low-risk pregnancy and has Plantar fasciitis; Allergic conjunctivitis and rhinitis; Supervision of normal pregnancy, antepartum; Class 1 obesity; and Atypical squamous cells of undetermined significance (ASCUS) on Papanicolaou smear of cervix on her problem list.  Patient reports occasional contractions.  Contractions: Not present. Vag. Bleeding: None.  Movement: Absent. Denies leaking of fluid.   The following portions of the patient's history were reviewed and updated as appropriate: allergies, current medications, past family history, past medical history, past social history, past surgical history and problem list. Problem list updated.  Objective:   Filed Vitals:   05/20/16 0847  BP: 127/72  Pulse: 104  Weight: 182 lb (82.555 kg)    Fetal Status: Fetal Heart Rate (bpm): 132   Movement: Absent     General:  Alert, oriented and cooperative. Patient is in no acute distress.  Skin: Skin is warm and dry. No rash noted.   Cardiovascular: Normal heart rate noted  Respiratory: Normal respiratory effort, no problems with respiration noted  Abdomen: Soft, gravid, appropriate for gestational age. Pain/Pressure: Present     Pelvic:  Cervical exam deferred        Extremities: Normal range of motion.  Edema: Trace  Mental Status: Normal mood and affect. Normal behavior. Normal judgment and thought content.   Urinalysis: Urine Protein: Trace Urine Glucose: Trace  Assessment and Plan:  Pregnancy: G1P0 at 670w0d  1. Supervision of normal pregnancy, antepartum, third trimester   Preterm labor symptoms and general obstetric precautions including but not limited to vaginal bleeding, contractions, leaking of fluid and fetal movement were reviewed in detail with the patient. Please refer to After Visit Summary for other counseling  recommendations.  Maternity support belt Is signed up for [facility].  Return in about 2 weeks (around 06/03/2016).   Dorathy KinsmanVirginia Caleb Decock, CNM

## 2016-05-21 ENCOUNTER — Encounter: Payer: Self-pay | Admitting: *Deleted

## 2016-05-24 ENCOUNTER — Inpatient Hospital Stay (HOSPITAL_COMMUNITY)
Admission: AD | Admit: 2016-05-24 | Discharge: 2016-05-24 | Disposition: A | Discharge status: Home / Self Care | Payer: 59 | Source: Ambulatory Visit | Attending: Obstetrics & Gynecology | Admitting: Obstetrics & Gynecology

## 2016-05-24 ENCOUNTER — Encounter (HOSPITAL_COMMUNITY): Payer: Self-pay | Admitting: *Deleted

## 2016-05-24 DIAGNOSIS — R51 Headache: Secondary | ICD-10-CM | POA: Insufficient documentation

## 2016-05-24 DIAGNOSIS — R519 Headache, unspecified: Secondary | ICD-10-CM

## 2016-05-24 DIAGNOSIS — O99283 Endocrine, nutritional and metabolic diseases complicating pregnancy, third trimester: Secondary | ICD-10-CM | POA: Diagnosis not present

## 2016-05-24 DIAGNOSIS — R42 Dizziness and giddiness: Secondary | ICD-10-CM | POA: Diagnosis not present

## 2016-05-24 DIAGNOSIS — O3483 Maternal care for other abnormalities of pelvic organs, third trimester: Secondary | ICD-10-CM | POA: Diagnosis not present

## 2016-05-24 DIAGNOSIS — E785 Hyperlipidemia, unspecified: Secondary | ICD-10-CM | POA: Diagnosis not present

## 2016-05-24 DIAGNOSIS — H538 Other visual disturbances: Secondary | ICD-10-CM | POA: Diagnosis not present

## 2016-05-24 DIAGNOSIS — Z3A31 31 weeks gestation of pregnancy: Secondary | ICD-10-CM | POA: Diagnosis not present

## 2016-05-24 DIAGNOSIS — O26893 Other specified pregnancy related conditions, third trimester: Secondary | ICD-10-CM | POA: Insufficient documentation

## 2016-05-24 DIAGNOSIS — E282 Polycystic ovarian syndrome: Secondary | ICD-10-CM | POA: Diagnosis not present

## 2016-05-24 HISTORY — DX: Headache: R51

## 2016-05-24 HISTORY — DX: Headache, unspecified: R51.9

## 2016-05-24 LAB — CBC
HCT: 37.4 % (ref 36.0–46.0)
HEMOGLOBIN: 12.5 g/dL (ref 12.0–15.0)
MCH: 26.5 pg (ref 26.0–34.0)
MCHC: 33.4 g/dL (ref 30.0–36.0)
MCV: 79.4 fL (ref 78.0–100.0)
PLATELETS: 178 10*3/uL (ref 150–400)
RBC: 4.71 MIL/uL (ref 3.87–5.11)
RDW: 14.8 % (ref 11.5–15.5)
WBC: 11.5 10*3/uL — AB (ref 4.0–10.5)

## 2016-05-24 LAB — COMPREHENSIVE METABOLIC PANEL
ALT: 27 U/L (ref 14–54)
AST: 24 U/L (ref 15–41)
Albumin: 3.1 g/dL — ABNORMAL LOW (ref 3.5–5.0)
Alkaline Phosphatase: 102 U/L (ref 38–126)
Anion gap: 9 (ref 5–15)
BUN: 11 mg/dL (ref 6–20)
CHLORIDE: 105 mmol/L (ref 101–111)
CO2: 20 mmol/L — ABNORMAL LOW (ref 22–32)
CREATININE: 0.55 mg/dL (ref 0.44–1.00)
Calcium: 9.1 mg/dL (ref 8.9–10.3)
Glucose, Bld: 95 mg/dL (ref 65–99)
Potassium: 3.7 mmol/L (ref 3.5–5.1)
Sodium: 134 mmol/L — ABNORMAL LOW (ref 135–145)
Total Bilirubin: <0.1 mg/dL — ABNORMAL LOW (ref 0.3–1.2)
Total Protein: 6.6 g/dL (ref 6.5–8.1)

## 2016-05-24 LAB — URINALYSIS, ROUTINE W REFLEX MICROSCOPIC
BILIRUBIN URINE: NEGATIVE
Glucose, UA: 250 mg/dL — AB
Hgb urine dipstick: NEGATIVE
KETONES UR: NEGATIVE mg/dL
LEUKOCYTES UA: NEGATIVE
NITRITE: NEGATIVE
Protein, ur: NEGATIVE mg/dL
Specific Gravity, Urine: 1.01 (ref 1.005–1.030)
pH: 6 (ref 5.0–8.0)

## 2016-05-24 MED ORDER — DEXAMETHASONE SODIUM PHOSPHATE 10 MG/ML IJ SOLN
10.0000 mg | Freq: Once | INTRAMUSCULAR | Status: AC
  Administered 2016-05-24: 10 mg via INTRAVENOUS
  Filled 2016-05-24: qty 1

## 2016-05-24 MED ORDER — DIPHENHYDRAMINE HCL 50 MG/ML IJ SOLN
25.0000 mg | Freq: Once | INTRAMUSCULAR | Status: AC
  Administered 2016-05-24: 25 mg via INTRAVENOUS
  Filled 2016-05-24: qty 1

## 2016-05-24 MED ORDER — LACTATED RINGERS IV BOLUS (SEPSIS)
1000.0000 mL | Freq: Once | INTRAVENOUS | Status: AC
  Administered 2016-05-24: 1000 mL via INTRAVENOUS

## 2016-05-24 MED ORDER — METOCLOPRAMIDE HCL 5 MG/ML IJ SOLN
10.0000 mg | Freq: Once | INTRAMUSCULAR | Status: AC
  Administered 2016-05-24: 10 mg via INTRAVENOUS
  Filled 2016-05-24: qty 2

## 2016-05-24 NOTE — MAU Note (Addendum)
C/o dizziness,blurred vision and feeling shaky since 1500 today; c/o lower back pain since noon; c/o also of headache @1500 ; hx of migraines; air conditioning not working well at work and pt had an extremely busy day nad on her feet all day; back pain is much better when pt is resting;

## 2016-05-24 NOTE — MAU Provider Note (Signed)
Chief Complaint:  Dizziness; Back Pain; Headache; and Blurred Vision   None     HPI: Dawn Wu is a 29 y.o. G1P0 at [redacted]w[redacted]d pt of [facility] who presents to maternity admissions reporting an episode today at work of dizziness, headache, shakiness, and blurred vision that has improved, but not resolved by the time she arrived in MAU.  She reports she was standing at work when the symptoms started and they improved some after sitting down but have not resolved. She drank a little water and Gatorade after the symptoms started and this seemed to help a little also.  She ate and drank normally today, and had a normal lunch 2-3 hours before the symptoms started.  She has a history of migraines but this does not feel like her usual migraine symptoms. The h/a is frontal, throbbing, and unchanged since onset. She reports good fetal movement, denies LOF, vaginal bleeding, vaginal itching/burning, urinary symptoms, n/v, or fever/chills.    HPI  Past Medical History: Past Medical History  Diagnosis Date  . Hyperlipidemia   . PCOS (polycystic ovarian syndrome)   . Infertility management   . Headache     Past obstetric history: OB History  Gravida Para Term Preterm AB SAB TAB Ectopic Multiple Living  1             # Outcome Date GA Lbr Len/2nd Weight Sex Delivery Anes PTL Lv  1 Current               Past Surgical History: Past Surgical History  Procedure Laterality Date  . Wisdom tooth extraction      Family History: Family History  Problem Relation Age of Onset  . Diabetes Mother   . Diabetes Father   . Hyperlipidemia Mother   . Hyperlipidemia Father   . Cancer Maternal Aunt     uterine/Ovarian    Social History: Social History  Substance Use Topics  . Smoking status: Never Smoker   . Smokeless tobacco: Never Used  . Alcohol Use: 0.0 oz/week    0 Standard drinks or equivalent per week     Comment: occassional    Allergies:  Allergies  Allergen Reactions  . Shellfish Allergy      Meds:  Prescriptions prior to admission  Medication Sig Dispense Refill Last Dose  . cetirizine (ZYRTEC) 10 MG tablet Take 10 mg by mouth daily as needed for allergies.    05/23/2016 at Unknown time  . Prenatal Vit-Fe Fumarate-FA (PRENATAL VITAMIN PO) Take 1 tablet by mouth daily.    05/24/2016 at Unknown time  . ranitidine (ZANTAC) 150 MG tablet Take one each evening to prevent reflux. (Patient taking differently: Take 150 mg by mouth 2 (two) times daily. Take one each evening to prevent reflux.) 90 tablet 1 05/24/2016 at Unknown time  . EPINEPHrine 0.3 mg/0.3 mL IJ SOAJ injection   1 rescue    ROS:  Review of Systems  Constitutional: Negative for fever, chills and fatigue.  Respiratory: Negative for shortness of breath.   Cardiovascular: Negative for chest pain.  Gastrointestinal: Negative for nausea and vomiting.  Genitourinary: Negative for dysuria, flank pain, vaginal bleeding, vaginal discharge, difficulty urinating, vaginal pain and pelvic pain.  Neurological: Positive for dizziness and headaches.  Psychiatric/Behavioral: Negative.      I have reviewed patient's Past Medical Hx, Surgical Hx, Family Hx, Social Hx, medications and allergies.   Physical Exam   Patient Vitals for the past 24 hrs:  BP Pulse  05/24/16 1616 125/74  mmHg 96  05/24/16 1613 134/72 mmHg 98   Constitutional: Well-developed, well-nourished female in no acute distress.  Cardiovascular: normal rate Respiratory: normal effort GI: Abd soft, non-tender, gravid appropriate for gestational age.  MS: Extremities nontender, no edema, normal ROM Neurologic: Alert and oriented x 4.  GU: Neg CVAT.  PELVIC EXAM: Deferred     FHT:  Baseline 135, moderate variability, accelerations present, no decelerations Contractions: None on toco or to palpation   Labs: Results for orders placed or performed during the hospital encounter of 05/24/16 (from the past 24 hour(s))  Urinalysis, Routine w reflex microscopic  (not at [facility])     Status: Abnormal   Collection Time: 05/24/16  3:55 PM  Result Value Ref Range   Color, Urine YELLOW YELLOW   APPearance CLEAR CLEAR   Specific Gravity, Urine 1.010 1.005 - 1.030   pH 6.0 5.0 - 8.0   Glucose, UA 250 (A) NEGATIVE mg/dL   Hgb urine dipstick NEGATIVE NEGATIVE   Bilirubin Urine NEGATIVE NEGATIVE   Ketones, ur NEGATIVE NEGATIVE mg/dL   Protein, ur NEGATIVE NEGATIVE mg/dL   Nitrite NEGATIVE NEGATIVE   Leukocytes, UA NEGATIVE NEGATIVE  CBC     Status: Abnormal   Collection Time: 05/24/16  5:10 PM  Result Value Ref Range   WBC 11.5 (H) 4.0 - 10.5 K/uL   RBC 4.71 3.87 - 5.11 MIL/uL   Hemoglobin 12.5 12.0 - 15.0 g/dL   HCT 13.237.4 44.036.0 - 10.246.0 %   MCV 79.4 78.0 - 100.0 fL   MCH 26.5 26.0 - 34.0 pg   MCHC 33.4 30.0 - 36.0 g/dL   RDW 72.514.8 36.611.5 - 44.015.5 %   Platelets 178 150 - 400 K/uL  Comprehensive metabolic panel     Status: Abnormal   Collection Time: 05/24/16  5:10 PM  Result Value Ref Range   Sodium 134 (L) 135 - 145 mmol/L   Potassium 3.7 3.5 - 5.1 mmol/L   Chloride 105 101 - 111 mmol/L   CO2 20 (L) 22 - 32 mmol/L   Glucose, Bld 95 65 - 99 mg/dL   BUN 11 6 - 20 mg/dL   Creatinine, Ser 3.470.55 0.44 - 1.00 mg/dL   Calcium 9.1 8.9 - 42.510.3 mg/dL   Total Protein 6.6 6.5 - 8.1 g/dL   Albumin 3.1 (L) 3.5 - 5.0 g/dL   AST 24 15 - 41 U/L   ALT 27 14 - 54 U/L   Alkaline Phosphatase 102 38 - 126 U/L   Total Bilirubin <0.1 (L) 0.3 - 1.2 mg/dL   GFR calc non Af Amer >60 >60 mL/min   GFR calc Af Amer >60 >60 mL/min   Anion gap 9 5 - 15   O/Positive/-- (02/10 0000)  Imaging:  No results found.  MAU Course/MDM: CBC, CMP, U/A, headache cocktail with LR x 1000 ml, Reglan 10 mg IV, Benadryl 25 mg IV, and Decadron 10 mg IV.  Pt reports complete resolution of h/a and dizziness after IV fluids and medications.  Labwork wnl.  Reviewed results with pt.  D/C home. F/u in office as scheduled, or call if symptoms persist.  Return to MAU for emergencies.  Pt stable at time  of discharge.  Assessment: 1. Dizziness   2. Headache in pregnancy, antepartum, third trimester     Plan: Discharge home Labor precautions and fetal kick counts     Follow-up Information    [facility] up with [facility] [place].   Specialty:  Obstetrics and  Gynecology   Why:  As scheduled, Return to MAU as needed for emergencies   Contact information:   [address], [address] [place] [address] [phone]       Medication List    TAKE these medications        cetirizine 10 MG tablet  Commonly known as:  ZYRTEC  Take 10 mg by mouth daily as needed for allergies.     EPINEPHrine 0.3 mg/0.3 mL Soaj injection  Commonly known as:  EPI-PEN     ranitidine 150 MG tablet  Commonly known as:  ZANTAC  Take one each evening to prevent reflux.      ASK your doctor about these medications        PRENATAL VITAMIN PO  Take 1 tablet by mouth daily.        Sharen Counter Certified Nurse-Midwife 05/24/2016 6:42 PM

## 2016-05-24 NOTE — Discharge Instructions (Signed)
General Headache Without Cause A headache is pain or discomfort felt around the head or neck area. The specific cause of a headache may not be found. There are many causes and types of headaches. A few common ones are:  Tension headaches.  Migraine headaches.  Cluster headaches.  Chronic daily headaches. HOME CARE INSTRUCTIONS  Watch your condition for any changes. Take these steps to help with your condition: Managing Pain  Take over-the-counter and prescription medicines only as told by your health care provider.  Lie down in a dark, quiet room when you have a headache.  If directed, apply ice to the head and neck area:  Put ice in a plastic bag.  Place a towel between your skin and the bag.  Leave the ice on for 20 minutes, 2-3 times per day.  Use a heating pad or hot shower to apply heat to the head and neck area as told by your health care provider.  Keep lights dim if bright lights bother you or make your headaches worse. Eating and Drinking  Eat meals on a regular schedule.  Limit alcohol use.  Decrease the amount of caffeine you drink, or stop drinking caffeine. General Instructions  Keep all follow-up visits as told by your health care provider. This is important.  Keep a headache journal to help find out what may trigger your headaches. For example, write down:  What you eat and drink.  How much sleep you get.  Any change to your diet or medicines.  Try massage or other relaxation techniques.  Limit stress.  Sit up straight, and do not tense your muscles.  Do not use tobacco products, including cigarettes, chewing tobacco, or e-cigarettes. If you need help quitting, ask your health care provider.  Exercise regularly as told by your health care provider.  Sleep on a regular schedule. Get 7-9 hours of sleep, or the amount recommended by your health care provider. SEEK MEDICAL CARE IF:   Your symptoms are not helped by medicine.  You have a  headache that is different from the usual headache.  You have nausea or you vomit.  You have a fever. SEEK IMMEDIATE MEDICAL CARE IF:   Your headache becomes severe.  You have repeated vomiting.  You have a stiff neck.  You have a loss of vision.  You have problems with speech.  You have pain in the eye or ear.  You have muscular weakness or loss of muscle control.  You lose your balance or have trouble walking.  You feel faint or pass out.  You have confusion.   This information is not intended to replace advice given to you by your health care provider. Make sure you discuss any questions you have with your health care provider.   Document Released: 10/21/2005 Document Revised: 07/12/2015 Document Reviewed: 02/13/2015 Elsevier Interactive Patient Education 2016 [employer]  Dizziness Dizziness is a common problem. It is a feeling of unsteadiness or light-headedness. You may feel like you are about to faint. Dizziness can lead to injury if you stumble or fall. Anyone can become dizzy, but dizziness is more common in older adults. This condition can be caused by a number of things, including medicines, dehydration, or illness. HOME CARE INSTRUCTIONS Taking these steps may help with your condition: Eating and Drinking  Drink enough fluid to keep your urine clear or pale yellow. This helps to keep you from becoming dehydrated. Try to drink more clear fluids, such as water.  Do not drink  alcohol.  Limit your caffeine intake if directed by your health care provider.  Limit your salt intake if directed by your health care provider. Activity  Avoid making quick movements.  Rise slowly from chairs and steady yourself until you feel okay.  In the morning, first sit up on the side of the bed. When you feel okay, stand slowly while you hold onto something until you know that your balance is fine.  Move your legs often if you need to stand in one place for a long time.  Tighten and relax your muscles in your legs while you are standing.  Do not drive or operate heavy machinery if you feel dizzy.  Avoid bending down if you feel dizzy. Place items in your home so that they are easy for you to reach without leaning over. Lifestyle  Do not use any tobacco products, including cigarettes, chewing tobacco, or electronic cigarettes. If you need help quitting, ask your health care provider.  Try to reduce your stress level, such as with yoga or meditation. Talk with your health care provider if you need help. General Instructions  Watch your dizziness for any changes.  Take medicines only as directed by your health care provider. Talk with your health care provider if you think that your dizziness is caused by a medicine that you are taking.  Tell a friend or a family member that you are feeling dizzy. If he or she notices any changes in your behavior, have this person call your health care provider.  Keep all follow-up visits as directed by your health care provider. This is important. SEEK MEDICAL CARE IF:  Your dizziness does not go away.  Your dizziness or light-headedness gets worse.  You feel nauseous.  You have reduced hearing.  You have new symptoms.  You are unsteady on your feet or you feel like the room is spinning. SEEK IMMEDIATE MEDICAL CARE IF:  You vomit or have diarrhea and are unable to eat or drink anything.  You have problems talking, walking, swallowing, or using your arms, hands, or legs.  You feel generally weak.  You are not thinking clearly or you have trouble forming sentences. It may take a friend or family member to notice this.  You have chest pain, abdominal pain, shortness of breath, or sweating.  Your vision changes.  You notice any bleeding.  You have a headache.  You have neck pain or a stiff neck.  You have a fever.   This information is not intended to replace advice given to you by your health care  provider. Make sure you discuss any questions you have with your health care provider.   Document Released: 04/16/2001 Document Revised: 03/07/2015 Document Reviewed: 10/17/2014 Elsevier Interactive Patient Education [employer]

## 2016-06-03 ENCOUNTER — Ambulatory Visit (INDEPENDENT_AMBULATORY_CARE_PROVIDER_SITE_OTHER): Payer: 59 | Admitting: Advanced Practice Midwife

## 2016-06-03 VITALS — BP 129/72 | HR 97 | Wt 184.0 lb

## 2016-06-03 DIAGNOSIS — Z3403 Encounter for supervision of normal first pregnancy, third trimester: Secondary | ICD-10-CM

## 2016-06-03 DIAGNOSIS — Z3493 Encounter for supervision of normal pregnancy, unspecified, third trimester: Secondary | ICD-10-CM

## 2016-06-03 NOTE — Progress Notes (Signed)
Subjective:  Dawn Wu is a 30 y.o. G1P0 at [redacted]w[redacted]d being seen today for ongoing prenatal care.  She is currently monitored for the following issues for this low-risk pregnancy and has Plantar fasciitis; Allergic conjunctivitis and rhinitis; Supervision of normal pregnancy, antepartum; Class 1 obesity; and Atypical squamous cells of undetermined significance (ASCUS) on Papanicolaou smear of cervix on her problem list.  Patient reports no complaints. Except some swelling in feet at times  Contractions: Not present. Vag. Bleeding: None.  Movement: Present. Denies leaking of fluid.   Was seen 05/24/16 in MAU for dizziness and dehydration. Required IV hydration. Better now. Watching water intake  The following portions of the patient's history were reviewed and updated as appropriate: allergies, current medications, past family history, past medical history, past social history, past surgical history and problem list. Problem list updated.  Objective:   Vitals:   06/03/16 1047  BP: 129/72  Pulse: 97  Weight: 184 lb (83.5 kg)    Fetal Status: Fetal Heart Rate (bpm): 139   Movement: Present     General:  Alert, oriented and cooperative. Patient is in no acute distress.  Skin: Skin is warm and dry. No rash noted.   Cardiovascular: Normal heart rate noted  Respiratory: Normal respiratory effort, no problems with respiration noted  Abdomen: Soft, gravid, appropriate for gestational age. Pain/Pressure: Absent     Pelvic:  Cervical exam deferred        Extremities: Normal range of motion.  Edema: Trace  Mental Status: Normal mood and affect. Normal behavior. Normal judgment and thought content.   Urinalysis: Urine Protein: Trace Urine Glucose: 1+  Assessment and Plan:  Pregnancy: G1P0 at [redacted]w[redacted]d  There are no diagnoses linked to this encounter. Preterm labor symptoms and general obstetric precautions including but not limited to vaginal bleeding, contractions, leaking of fluid and fetal  movement were reviewed in detail with the patient. Please refer to After Visit Summary for other counseling recommendations.  Return in about 2 weeks (around 06/17/2016) for [employer].   Aviva Signs, CNM

## 2016-06-03 NOTE — Progress Notes (Signed)
Patient ID: Dawn Wu, female   DOB: 07-Jun-1986, 30 y.o.   MRN: [id number]

## 2016-06-03 NOTE — Patient Instructions (Addendum)

## 2016-06-17 ENCOUNTER — Ambulatory Visit (INDEPENDENT_AMBULATORY_CARE_PROVIDER_SITE_OTHER): Payer: 59 | Admitting: Certified Nurse Midwife

## 2016-06-17 VITALS — BP 119/74 | HR 107 | Wt 182.0 lb

## 2016-06-17 DIAGNOSIS — N898 Other specified noninflammatory disorders of vagina: Secondary | ICD-10-CM

## 2016-06-17 DIAGNOSIS — O26853 Spotting complicating pregnancy, third trimester: Secondary | ICD-10-CM

## 2016-06-17 DIAGNOSIS — O26893 Other specified pregnancy related conditions, third trimester: Secondary | ICD-10-CM | POA: Diagnosis not present

## 2016-06-17 DIAGNOSIS — Z3483 Encounter for supervision of other normal pregnancy, third trimester: Secondary | ICD-10-CM

## 2016-06-17 NOTE — Progress Notes (Signed)
Subjective:  Dawn Wu is a 30 y.o. G1P0 at 2360w0d being seen today for ongoing prenatal care.  She is currently monitored for the following issues for this low-risk pregnancy and has Plantar fasciitis; Allergic conjunctivitis and rhinitis; Supervision of normal pregnancy, antepartum; Class 1 obesity; and Atypical squamous cells of undetermined significance (ASCUS) on Papanicolaou smear of cervix on her problem list.  Patient reports pink discharge last week and intermittent scant brown discharge since then, no odor, last IC 2 days ago but none for 2 weeks before that.  Contractions: Irregular. Vag. Bleeding: None.  Movement: Present. Denies leaking of fluid.   The following portions of the patient's history were reviewed and updated as appropriate: allergies, current medications, past family history, past medical history, past social history, past surgical history and problem list. Problem list updated.  Objective:   Vitals:   06/17/16 0833  BP: 119/74  Pulse: (!) 107  Weight: 182 lb (82.6 kg)    Fetal Status: Fetal Heart Rate (bpm): 122   Movement: Present     General:  Alert, oriented and cooperative. Patient is in no acute distress.  Skin: Skin is warm and dry. No rash noted.   Cardiovascular: Normal heart rate noted  Respiratory: Normal respiratory effort, no problems with respiration noted  Abdomen: Soft, gravid, appropriate for gestational age. Pain/Pressure: Present     Pelvic: Vag. Bleeding: None Vag D/C Character: Thin, white, ectropian present Cervical exam performed        Extremities: Normal range of motion.  Edema: Trace  Mental Status: Normal mood and affect. Normal behavior. Normal judgment and thought content.   Urinalysis: Urine Protein: Trace Urine Glucose: Negative  Assessment and Plan:  Pregnancy: G1P0 at 7560w0d  1. Supervision of normal pregnancy, antepartum, third trimester - Third trimester anticipatory guidance - GBS next visit  2. Vaginal discharge  during pregnancy, third trimester  - Wet prep, genital  3. Spotting in pregnancy  - likely physiologic with cervical ripening -bleeding precautions  Preterm labor symptoms and general obstetric precautions including but not limited to vaginal bleeding, contractions, leaking of fluid and fetal movement were reviewed in detail with the patient. Please refer to After Visit Summary for other counseling recommendations.  Return in about 1 week (around 06/24/2016).   Donette LarryMelanie Jonatan Wilsey, CNM

## 2016-06-18 ENCOUNTER — Telehealth: Payer: Self-pay

## 2016-06-18 ENCOUNTER — Other Ambulatory Visit: Payer: Self-pay

## 2016-06-18 DIAGNOSIS — N760 Acute vaginitis: Principal | ICD-10-CM

## 2016-06-18 DIAGNOSIS — B9689 Other specified bacterial agents as the cause of diseases classified elsewhere: Secondary | ICD-10-CM

## 2016-06-18 LAB — WET PREP, GENITAL
Trich, Wet Prep: NONE SEEN
Yeast Wet Prep HPF POC: NONE SEEN

## 2016-06-18 MED ORDER — METRONIDAZOLE 500 MG PO TABS: 500.0000 mg | ORAL_TABLET | Freq: Two times a day (BID) | ORAL | 0 refills | Status: DC

## 2016-06-18 MED FILL — metroNIDAZOLE 500 MG TABS: 500 | 7 days supply | Qty: 14 | Fill #0

## 2016-06-18 NOTE — Telephone Encounter (Signed)
Left message on pt's voicemail to have her call the office about lab results

## 2016-06-18 NOTE — Progress Notes (Unsigned)
Flagyl   Pt needs Flagyl for BV

## 2016-06-24 ENCOUNTER — Ambulatory Visit (INDEPENDENT_AMBULATORY_CARE_PROVIDER_SITE_OTHER): Payer: 59 | Admitting: Advanced Practice Midwife

## 2016-06-24 ENCOUNTER — Encounter: Payer: Self-pay | Admitting: Advanced Practice Midwife

## 2016-06-24 ENCOUNTER — Encounter: Payer: Self-pay | Admitting: *Deleted

## 2016-06-24 VITALS — BP 116/72 | HR 100 | Wt 184.0 lb

## 2016-06-24 DIAGNOSIS — Z3493 Encounter for supervision of normal pregnancy, unspecified, third trimester: Secondary | ICD-10-CM

## 2016-06-24 DIAGNOSIS — Z113 Encounter for screening for infections with a predominantly sexual mode of transmission: Secondary | ICD-10-CM

## 2016-06-24 NOTE — Patient Instructions (Signed)
Vaginal Delivery °During delivery, your health care provider will help you give birth to your baby. During a vaginal delivery, you will work to push the baby out of your vagina. However, before you can push your baby out, a few things need to happen. The opening of your uterus (cervix) has to soften, thin out, and open up (dilate) all the way to 10 cm. Also, your baby has to move down from the uterus into your vagina.  °SIGNS OF LABOR  °Your health care provider will first need to make sure you are in labor. Signs of labor include:  °· Passing what is called the mucous plug before labor begins. This is a small amount of blood-stained mucus. °· Having regular, painful uterine contractions.   °· The time between contractions gets shorter.   °· The discomfort and pain gradually get more intense. °· Contraction pains get worse when walking and do not go away when resting.   °· Your cervix becomes thinner (effacement) and dilates. °BEFORE THE DELIVERY °Once you are in labor and admitted into the hospital or care center, your health care provider may do the following:  °· Perform a complete physical exam. °· Review any complications related to pregnancy or labor.  °· Check your blood pressure, pulse, temperature, and heart rate (vital signs).   °· Determine if, and when, the rupture of amniotic membranes occurred. °· Do a vaginal exam (using a sterile glove and lubricant) to determine:   °¨ The position (presentation) of the baby. Is the baby's head presenting first (vertex) in the birth canal (vagina), or are the feet or buttocks first (breech)?   °¨ The level (station) of the baby's head within the birth canal.   °¨ The effacement and dilatation of the cervix.   °· An electronic fetal monitor is usually placed on your abdomen when you first arrive. This is used to monitor your contractions and the baby's heart rate. °¨ When the monitor is on your abdomen (external fetal monitor), it can only pick up the frequency and  length of your contractions. It cannot tell the strength of your contractions. °¨ If it becomes necessary for your health care provider to know exactly how strong your contractions are or to see exactly what the baby's heart rate is doing, an internal monitor may be inserted into your vagina and uterus. Your health care provider will discuss the benefits and risks of using an internal monitor and obtain your permission before inserting the device. °¨ Continuous fetal monitoring may be needed if you have an epidural, are receiving certain medicines (such as oxytocin), or have pregnancy or labor complications. °· An IV access tube may be placed into a vein in your arm to deliver fluids and medicines if necessary. °THREE STAGES OF LABOR AND DELIVERY °Normal labor and delivery is divided into three stages. °First Stage °This stage starts when you begin to contract regularly and your cervix begins to efface and dilate. It ends when your cervix is completely open (fully dilated). The first stage is the longest stage of labor and can last from 3 hours to 15 hours.  °Several methods are available to help with labor pain. You and your health care provider will decide which option is best for you. Options include:  °· Opioid medicines. These are strong pain medicines that you can get through your IV tube or as a shot into your muscle. These medicines lessen pain but do not make it go away completely.  °· Epidural. A medicine is given through a thin tube that   is inserted in your back. The medicine numbs the lower part of your body and prevents any pain in that area. °· Paracervical pain medicine. This is an injection of an anesthetic on each side of your cervix.   °· You may request natural childbirth, which does not involve the use of pain medicines or an epidural during labor and delivery. Instead, you will use other things, such as breathing exercises, to help cope with the pain. °Second Stage °The second stage of labor  begins when your cervix is fully dilated at 10 cm. It continues until you push your baby down through the birth canal and the baby is born. This stage can take only minutes or several hours. °· The location of your baby's head as it moves through the birth canal is reported as a number called a station. If the baby's head has not started its descent, the station is described as being at minus 3 (-3). When your baby's head is at the zero station, it is at the middle of the birth canal and is engaged in the pelvis. The station of your baby helps indicate the progress of the second stage of labor. °· When your baby is born, your health care provider may hold the baby with his or her head lowered to prevent amniotic fluid, mucus, and blood from getting into the baby's lungs. The baby's mouth and nose may be suctioned with a small bulb syringe to remove any additional fluid. °· Your health care provider may then place the baby on your stomach. It is important to keep the baby from getting cold. To do this, the health care provider will dry the baby off, place the baby directly on your skin (with no blankets between you and the baby), and cover the baby with warm, dry blankets.   °· The umbilical cord is cut. °Third Stage °During the third stage of labor, your health care provider will deliver the placenta (afterbirth) and make sure your bleeding is under control. The delivery of the placenta usually takes about 5 minutes but can take up to 30 minutes. After the placenta is delivered, a medicine may be given either by IV or injection to help contract the uterus and control bleeding. If you are planning to breastfeed, you can try to do so now. °After you deliver the placenta, your uterus should contract and get very firm. If your uterus does not remain firm, your health care provider will massage it. This is important because the contraction of the uterus helps cut off bleeding at the site where the placenta was attached  to your uterus. If your uterus does not contract properly and stay firm, you may continue to bleed heavily. If there is a lot of bleeding, medicines may be given to contract the uterus and stop the bleeding.  °  °This information is not intended to replace advice given to you by your health care provider. Make sure you discuss any questions you have with your health care provider. °  °Document Released: 07/30/2008 Document Revised: 11/11/2014 Document Reviewed: 06/17/2012 °Elsevier Interactive Patient Education ©2016 [employer] ° °

## 2016-06-24 NOTE — Progress Notes (Signed)
Subjective:  Dawn Wu is a 30 y.o. G1P0 at 6838w0d being seen today for ongoing prenatal care.  She is currently monitored for the following issues for this low-risk pregnancy and has Plantar fasciitis; Allergic conjunctivitis and rhinitis; Supervision of normal pregnancy, antepartum; Class 1 obesity; and Atypical squamous cells of undetermined significance (ASCUS) on Papanicolaou smear of cervix on her problem list.  Patient reports backache.  Contractions: Not present. Vag. Bleeding: None.  Movement: Present. Denies leaking of fluid.   The following portions of the patient's history were reviewed and updated as appropriate: allergies, current medications, past family history, past medical history, past social history, past surgical history and problem list. Problem list updated.  Objective:   Vitals:   06/24/16 0834  BP: 116/72  Pulse: 100  Weight: 83.5 kg (184 lb)    Fetal Status: Fetal Heart Rate (bpm): 140   Movement: Present     General:  Alert, oriented and cooperative. Patient is in no acute distress.  Skin: Skin is warm and dry. No rash noted.   Cardiovascular: Normal heart rate noted  Respiratory: Normal respiratory effort, no problems with respiration noted  Abdomen: Soft, gravid, appropriate for gestational age. Pain/Pressure: Present     Pelvic:  Cervical exam performed       FT-1/long/-3/vertex  Extremities: Normal range of motion.  Edema: Trace  Mental Status: Normal mood and affect. Normal behavior. Normal judgment and thought content.   Urinalysis: Urine Protein: Trace Urine Glucose: Negative  Assessment and Plan:  Pregnancy: G1P0 at 5138w0d  1. Supervision of normal pregnancy, third trimester       Vertex per exam - Culture, beta strep (group b only) - Urine cytology ancillary only  Term labor symptoms and general obstetric precautions including but not limited to vaginal bleeding, contractions, leaking of fluid and fetal movement were reviewed in detail with  the patient. Please refer to After Visit Summary for other counseling recommendations.  RTO 1 week  Aviva SignsMarie L Ewin Rehberg, CNM

## 2016-06-25 LAB — URINE CYTOLOGY ANCILLARY ONLY
CHLAMYDIA, DNA PROBE: NEGATIVE
Neisseria Gonorrhea: NEGATIVE

## 2016-06-26 LAB — CULTURE, BETA STREP (GROUP B ONLY)

## 2016-06-28 ENCOUNTER — Inpatient Hospital Stay (HOSPITAL_COMMUNITY)
Admission: AD | Admit: 2016-06-28 | Discharge: 2016-06-28 | Disposition: A | Discharge status: Home / Self Care | Payer: 59 | Source: Ambulatory Visit | Attending: Obstetrics & Gynecology | Admitting: Obstetrics & Gynecology

## 2016-06-28 DIAGNOSIS — Z3493 Encounter for supervision of normal pregnancy, unspecified, third trimester: Secondary | ICD-10-CM | POA: Diagnosis not present

## 2016-06-28 DIAGNOSIS — Z3A37 37 weeks gestation of pregnancy: Secondary | ICD-10-CM | POA: Insufficient documentation

## 2016-06-28 MED ORDER — LACTATED RINGERS IV BOLUS (SEPSIS): 1000.0000 mL | Freq: Once | INTRAVENOUS | Status: DC

## 2016-06-28 NOTE — Discharge Instructions (Signed)
Vaginal Delivery °During delivery, your health care provider will help you give birth to your baby. During a vaginal delivery, you will work to push the baby out of your vagina. However, before you can push your baby out, a few things need to happen. The opening of your uterus (cervix) has to soften, thin out, and open up (dilate) all the way to 10 cm. Also, your baby has to move down from the uterus into your vagina.  °SIGNS OF LABOR  °Your health care provider will first need to make sure you are in labor. Signs of labor include:  °· Passing what is called the mucous plug before labor begins. This is a small amount of blood-stained mucus. °· Having regular, painful uterine contractions.   °· The time between contractions gets shorter.   °· The discomfort and pain gradually get more intense. °· Contraction pains get worse when walking and do not go away when resting.   °· Your cervix becomes thinner (effacement) and dilates. °BEFORE THE DELIVERY °Once you are in labor and admitted into the hospital or care center, your health care provider may do the following:  °· Perform a complete physical exam. °· Review any complications related to pregnancy or labor.  °· Check your blood pressure, pulse, temperature, and heart rate (vital signs).   °· Determine if, and when, the rupture of amniotic membranes occurred. °· Do a vaginal exam (using a sterile glove and lubricant) to determine:   °¨ The position (presentation) of the baby. Is the baby's head presenting first (vertex) in the birth canal (vagina), or are the feet or buttocks first (breech)?   °¨ The level (station) of the baby's head within the birth canal.   °¨ The effacement and dilatation of the cervix.   °· An electronic fetal monitor is usually placed on your abdomen when you first arrive. This is used to monitor your contractions and the baby's heart rate. °¨ When the monitor is on your abdomen (external fetal monitor), it can only pick up the frequency and  length of your contractions. It cannot tell the strength of your contractions. °¨ If it becomes necessary for your health care provider to know exactly how strong your contractions are or to see exactly what the baby's heart rate is doing, an internal monitor may be inserted into your vagina and uterus. Your health care provider will discuss the benefits and risks of using an internal monitor and obtain your permission before inserting the device. °¨ Continuous fetal monitoring may be needed if you have an epidural, are receiving certain medicines (such as oxytocin), or have pregnancy or labor complications. °· An IV access tube may be placed into a vein in your arm to deliver fluids and medicines if necessary. °THREE STAGES OF LABOR AND DELIVERY °Normal labor and delivery is divided into three stages. °First Stage °This stage starts when you begin to contract regularly and your cervix begins to efface and dilate. It ends when your cervix is completely open (fully dilated). The first stage is the longest stage of labor and can last from 3 hours to 15 hours.  °Several methods are available to help with labor pain. You and your health care provider will decide which option is best for you. Options include:  °· Opioid medicines. These are strong pain medicines that you can get through your IV tube or as a shot into your muscle. These medicines lessen pain but do not make it go away completely.  °· Epidural. A medicine is given through a thin tube that   is inserted in your back. The medicine numbs the lower part of your body and prevents any pain in that area. °· Paracervical pain medicine. This is an injection of an anesthetic on each side of your cervix.   °· You may request natural childbirth, which does not involve the use of pain medicines or an epidural during labor and delivery. Instead, you will use other things, such as breathing exercises, to help cope with the pain. °Second Stage °The second stage of labor  begins when your cervix is fully dilated at 10 cm. It continues until you push your baby down through the birth canal and the baby is born. This stage can take only minutes or several hours. °· The location of your baby's head as it moves through the birth canal is reported as a number called a station. If the baby's head has not started its descent, the station is described as being at minus 3 (-3). When your baby's head is at the zero station, it is at the middle of the birth canal and is engaged in the pelvis. The station of your baby helps indicate the progress of the second stage of labor. °· When your baby is born, your health care provider may hold the baby with his or her head lowered to prevent amniotic fluid, mucus, and blood from getting into the baby's lungs. The baby's mouth and nose may be suctioned with a small bulb syringe to remove any additional fluid. °· Your health care provider may then place the baby on your stomach. It is important to keep the baby from getting cold. To do this, the health care provider will dry the baby off, place the baby directly on your skin (with no blankets between you and the baby), and cover the baby with warm, dry blankets.   °· The umbilical cord is cut. °Third Stage °During the third stage of labor, your health care provider will deliver the placenta (afterbirth) and make sure your bleeding is under control. The delivery of the placenta usually takes about 5 minutes but can take up to 30 minutes. After the placenta is delivered, a medicine may be given either by IV or injection to help contract the uterus and control bleeding. If you are planning to breastfeed, you can try to do so now. °After you deliver the placenta, your uterus should contract and get very firm. If your uterus does not remain firm, your health care provider will massage it. This is important because the contraction of the uterus helps cut off bleeding at the site where the placenta was attached  to your uterus. If your uterus does not contract properly and stay firm, you may continue to bleed heavily. If there is a lot of bleeding, medicines may be given to contract the uterus and stop the bleeding.  °  °This information is not intended to replace advice given to you by your health care provider. Make sure you discuss any questions you have with your health care provider. °  °Document Released: 07/30/2008 Document Revised: 11/11/2014 Document Reviewed: 06/17/2012 °Elsevier Interactive Patient Education ©2016 [employer] Inc[employer] ° °

## 2016-06-28 NOTE — MAU Note (Signed)
Pt discharged home with instructions. Verbalizes and understanding. No concerns noted at this time.

## 2016-06-28 NOTE — MAU Note (Signed)
Is contracting, stronger than they have been, still about 15 min apart. Was 1 cm on Mon.? Leaking one or 2 days ago.  Feels like she has been peeing on herself today., has had to change her pad.

## 2016-06-28 NOTE — MAU Note (Signed)
Pt received to MAU c/o contractions which started around 0900 this morning. Rates contractions 7/10 on pain scale. Denies vaginal bleeding. Pt states that she has an increase of clear discharge. Positive fetal movement. EFM applied. FHR 130. Selah Klang L Ranvir Renovato

## 2016-07-01 ENCOUNTER — Encounter: Payer: Self-pay | Admitting: Advanced Practice Midwife

## 2016-07-01 ENCOUNTER — Ambulatory Visit (INDEPENDENT_AMBULATORY_CARE_PROVIDER_SITE_OTHER): Payer: 59 | Admitting: Advanced Practice Midwife

## 2016-07-01 VITALS — BP 125/71 | HR 98 | Wt 186.0 lb

## 2016-07-01 DIAGNOSIS — Z3493 Encounter for supervision of normal pregnancy, unspecified, third trimester: Secondary | ICD-10-CM

## 2016-07-01 DIAGNOSIS — Z3483 Encounter for supervision of other normal pregnancy, third trimester: Secondary | ICD-10-CM

## 2016-07-01 NOTE — Progress Notes (Signed)
Subjective:  Dawn Wu is a 30 y.o. G1P0 at 5580w0d being seen today for ongoing prenatal care.  She is currently monitored for the following issues for this low-risk pregnancy and has Plantar fasciitis; Allergic conjunctivitis and rhinitis; Supervision of normal pregnancy, antepartum; Class 1 obesity; and Atypical squamous cells of undetermined significance (ASCUS) on Papanicolaou smear of cervix on her problem list.  Patient reports scant bleeding with wiping since cervical exam Friday. .  Contractions: Irregular. Vag. Bleeding: None.  Movement: Present. Denies leaking of fluid.   The following portions of the patient's history were reviewed and updated as appropriate: allergies, current medications, past family history, past medical history, past social history, past surgical history and problem list. Problem list updated.  Objective:   Vitals:   07/01/16 0836  BP: 125/71  Pulse: 98  Weight: 186 lb (84.4 kg)    Fetal Status: Fetal Heart Rate (bpm): 141 Fundal Height: 37 cm Movement: Present  Presentation: Vertex  General:  Alert, oriented and cooperative. Patient is in no acute distress.  Skin: Skin is warm and dry. No rash noted.   Cardiovascular: Normal heart rate noted  Respiratory: Normal respiratory effort, no problems with respiration noted  Abdomen: Soft, gravid, appropriate for gestational age. Pain/Pressure: Present     Pelvic:  Cervical exam deferred        Extremities: Normal range of motion.  Edema: Trace  Mental Status: Normal mood and affect. Normal behavior. Normal judgment and thought content.   Urinalysis: Urine Protein: Trace Urine Glucose: Negative  GBS neg  Assessment and Plan:  Pregnancy: G1P0 at 2780w0d  1. Supervision of normal pregnancy, third trimester   2. Supervision of normal pregnancy, antepartum, third trimester     Term labor symptoms and general obstetric precautions including but not limited to vaginal bleeding, contractions, leaking of  fluid and fetal movement were reviewed in detail with the patient. Please refer to After Visit Summary for other counseling recommendations.  Return in about 1 week (around 07/08/2016).   Dorathy KinsmanVirginia Daanish Copes, CNM

## 2016-07-01 NOTE — Patient Instructions (Signed)
Braxton Hicks Contractions °Contractions of the uterus can occur throughout pregnancy. Contractions are not always a sign that you are in labor.  °WHAT ARE BRAXTON HICKS CONTRACTIONS?  °Contractions that occur before labor are called Braxton Hicks contractions, or false labor. Toward the end of pregnancy (32-34 weeks), these contractions can develop more often and may become more forceful. This is not true labor because these contractions do not result in opening (dilatation) and thinning of the cervix. They are sometimes difficult to tell apart from true labor because these contractions can be forceful and people have different pain tolerances. You should not feel embarrassed if you go to the hospital with false labor. Sometimes, the only way to tell if you are in true labor is for your health care provider to look for changes in the cervix. °If there are no prenatal problems or other health problems associated with the pregnancy, it is completely safe to be sent home with false labor and await the onset of true labor. °HOW CAN YOU TELL THE DIFFERENCE BETWEEN TRUE AND FALSE LABOR? °False Labor °· The contractions of false labor are usually shorter and not as hard as those of true labor.   °· The contractions are usually irregular.   °· The contractions are often felt in the front of the lower abdomen and in the groin.   °· The contractions may go away when you walk around or change positions while lying down.   °· The contractions get weaker and are shorter lasting as time goes on.   °· The contractions do not usually become progressively stronger, regular, and closer together as with true labor.   °True Labor °· Contractions in true labor last 30-70 seconds, become very regular, usually become more intense, and increase in frequency.   °· The contractions do not go away with walking.   °· The discomfort is usually felt in the top of the uterus and spreads to the lower abdomen and low back.   °· True labor can be  determined by your health care provider with an exam. This will show that the cervix is dilating and getting thinner.   °WHAT TO REMEMBER °· Keep up with your usual exercises and follow other instructions given by your health care provider.   °· Take medicines as directed by your health care provider.   °· Keep your regular prenatal appointments.   °· Eat and drink lightly if you think you are going into labor.   °· If Braxton Hicks contractions are making you uncomfortable:   °¨ Change your position from lying down or resting to walking, or from walking to resting.   °¨ Sit and rest in a tub of warm water.   °¨ Drink 2-3 glasses of water. Dehydration may cause these contractions.   °¨ Do slow and deep breathing several times an hour.   °WHEN SHOULD I SEEK IMMEDIATE MEDICAL CARE? °Seek immediate medical care if: °· Your contractions become stronger, more regular, and closer together.   °· You have fluid leaking or gushing from your vagina.   °· You have a fever.   °· You pass blood-tinged mucus.   °· You have vaginal bleeding.   °· You have continuous abdominal pain.   °· You have low back pain that you never had before.   °· You feel your baby's head pushing down and causing pelvic pressure.   °· Your baby is not moving as much as it used to.   °  °This information is not intended to replace advice given to you by your health care provider. Make sure you discuss any questions you have with your health care   provider. °  °Document Released: 10/21/2005 Document Revised: 10/26/2013 Document Reviewed: 08/02/2013 °Elsevier Interactive Patient Education ©2016 [employer] ° °

## 2016-07-02 ENCOUNTER — Encounter: Payer: Self-pay | Admitting: *Deleted

## 2016-07-02 ENCOUNTER — Telehealth: Payer: Self-pay | Admitting: *Deleted

## 2016-07-02 NOTE — Telephone Encounter (Signed)
See previous note

## 2016-07-02 NOTE — Telephone Encounter (Signed)
Pt sent a request through my-chart requesting that she be allowed to do a sit down job and have frequent breaks at work due to feet and ankle swelling the remainder of this pregnancy.

## 2016-07-09 ENCOUNTER — Ambulatory Visit (INDEPENDENT_AMBULATORY_CARE_PROVIDER_SITE_OTHER): Payer: 59 | Admitting: Obstetrics & Gynecology

## 2016-07-09 VITALS — Wt 188.0 lb

## 2016-07-09 DIAGNOSIS — E669 Obesity, unspecified: Secondary | ICD-10-CM

## 2016-07-09 DIAGNOSIS — Z3483 Encounter for supervision of other normal pregnancy, third trimester: Secondary | ICD-10-CM

## 2016-07-09 NOTE — Progress Notes (Signed)
   PRENATAL VISIT NOTE  Subjective:  Dawn Wu is a 30 y.o. G1P0 at 248w1d being seen today for ongoing prenatal care.  She is currently monitored for the following issues for this low-risk pregnancy and has Plantar fasciitis; Allergic conjunctivitis and rhinitis; Supervision of normal pregnancy, antepartum; Class 1 obesity; and Atypical squamous cells of undetermined significance (ASCUS) on Papanicolaou smear of cervix on her problem list.  Patient reports some pain over coccyx which was feractured prev..  Contractions: Irregular. Vag. Bleeding: None.  Movement: Present. Denies leaking of fluid.   The following portions of the patient's history were reviewed and updated as appropriate: allergies, current medications, past family history, past medical history, past social history, past surgical history and problem list. Problem list updated.  Objective:   Vitals:   07/09/16 1528  Weight: 188 lb (85.3 kg)    Fetal Status: Fetal Heart Rate (bpm): 139   Movement: Present     General:  Alert, oriented and cooperative. Patient is in no acute distress.  Skin: Skin is warm and dry. No rash noted.   Cardiovascular: Normal heart rate noted  Respiratory: Normal respiratory effort, no problems with respiration noted  Abdomen: Soft, gravid, appropriate for gestational age. Pain/Pressure: Present     Pelvic:  Cervical exam performed        Extremities: Normal range of motion.  Edema: Trace  Mental Status: Normal mood and affect. Normal behavior. Normal judgment and thought content.   Urinalysis: Urine Protein: Negative Urine Glucose: Negative  Assessment and Plan:  Pregnancy: G1P0 at 3348w1d  1. Supervision of normal pregnancy, antepartum, third trimester  2. Class 1 obesity  Term labor symptoms and general obstetric precautions including but not limited to vaginal bleeding, contractions, leaking of fluid and fetal movement were reviewed in detail with the patient. Please refer to After  Visit Summary for other counseling recommendations.  Return in about 1 week (around 07/16/2016).  Willodean Rosenthalarolyn Harraway-Smith, MD

## 2016-07-15 ENCOUNTER — Ambulatory Visit (INDEPENDENT_AMBULATORY_CARE_PROVIDER_SITE_OTHER): Payer: 59 | Admitting: Advanced Practice Midwife

## 2016-07-15 VITALS — BP 127/87 | HR 91 | Wt 186.0 lb

## 2016-07-15 DIAGNOSIS — R350 Frequency of micturition: Secondary | ICD-10-CM | POA: Diagnosis not present

## 2016-07-15 DIAGNOSIS — Z3483 Encounter for supervision of other normal pregnancy, third trimester: Secondary | ICD-10-CM

## 2016-07-15 NOTE — Progress Notes (Signed)
   PRENATAL VISIT NOTE  Subjective:  Dawn Wu is a 30 y.o. G1P0 at 4576w0d being seen today for ongoing prenatal care.  She is currently monitored for the following issues for this low-risk pregnancy and has Plantar fasciitis; Allergic conjunctivitis and rhinitis; Supervision of normal pregnancy, antepartum; Class 1 obesity; and Atypical squamous cells of undetermined significance (ASCUS) on Papanicolaou smear of cervix on her problem list.  Patient reports occasional contractions and urinary frequency. Denies hematuria, dysuria. .  Contractions: Irregular. Vag. Bleeding: None.  Movement: Present. Denies leaking of fluid.   The following portions of the patient's history were reviewed and updated as appropriate: allergies, current medications, past family history, past medical history, past social history, past surgical history and problem list. Problem list updated.  Objective:   Vitals:   07/15/16 1141  BP: 127/87  Pulse: 91  Weight: 186 lb (84.4 kg)    Fetal Status: Fetal Heart Rate (bpm): 136 Fundal Height: 39 cm Movement: Present  Presentation: Vertex  General:  Alert, oriented and cooperative. Patient is in no acute distress.  Skin: Skin is warm and dry. No rash noted.   Cardiovascular: Normal heart rate noted  Respiratory: Normal respiratory effort, no problems with respiration noted  Abdomen: Soft, gravid, appropriate for gestational age. Pain/Pressure: Present     Pelvic:  Cervical exam performed Dilation: 1 Effacement (%): 50 Station: -3  Extremities: Normal range of motion.  Edema: Trace  Mental Status: Normal mood and affect. Normal behavior. Normal judgment and thought content.   Urinalysis: Urine Protein: Negative Urine Glucose: Negative  Assessment and Plan:  Pregnancy: G1P0 at 6276w0d  1. Urinary frequency  - Culture, OB Urine  Term labor symptoms and general obstetric precautions including but not limited to vaginal bleeding, contractions, leaking of fluid  and fetal movement were reviewed in detail with the patient. Please refer to After Visit Summary for other counseling recommendations.  Return in about 1 week (around 07/22/2016).  Dorathy KinsmanVirginia Glorie Dowlen, CNM

## 2016-07-16 LAB — CULTURE, OB URINE

## 2016-07-19 ENCOUNTER — Ambulatory Visit (INDEPENDENT_AMBULATORY_CARE_PROVIDER_SITE_OTHER): Payer: 59 | Admitting: Obstetrics and Gynecology

## 2016-07-19 VITALS — BP 118/71 | HR 85 | Wt 187.0 lb

## 2016-07-19 DIAGNOSIS — Z3483 Encounter for supervision of other normal pregnancy, third trimester: Secondary | ICD-10-CM | POA: Diagnosis not present

## 2016-07-19 NOTE — Progress Notes (Signed)
   PRENATAL VISIT NOTE  Subjective:  Dawn Wu is a 30 y.o. G1P0 at 2732w4d being seen today for ongoing prenatal care.  She is currently monitored for the following issues for this low-risk pregnancy and has Plantar fasciitis; Allergic conjunctivitis and rhinitis; Supervision of normal pregnancy, antepartum; Class 1 obesity; and Atypical squamous cells of undetermined significance (ASCUS) on Papanicolaou smear of cervix on her problem list.  Patient reports occasional contractions.  Contractions: Regular. Vag. Bleeding: None.  Movement: Present. Denies leaking of fluid.   The following portions of the patient's history were reviewed and updated as appropriate: allergies, current medications, past family history, past medical history, past social history, past surgical history and problem list. Problem list updated.  Objective:   Vitals:   07/19/16 0855  BP: 118/71  Pulse: 85  Weight: 187 lb (84.8 kg)    Fetal Status:     Movement: Present     General:  Alert, oriented and cooperative. Patient is in no acute distress.  Skin: Skin is warm and dry. No rash noted.   Cardiovascular: Normal heart rate noted  Respiratory: Normal respiratory effort, no problems with respiration noted  Abdomen: Soft, gravid, appropriate for gestational age. Pain/Pressure: Present     Pelvic:  Cervical exam performed        Extremities: Normal range of motion.  Edema: Trace  Mental Status: Normal mood and affect. Normal behavior. Normal judgment and thought content.   Urinalysis: Urine Protein: Negative Urine Glucose: Negative  Assessment and Plan:  Pregnancy: G1P0 at 4232w4d  There are no diagnoses linked to this encounter. Term labor symptoms and general obstetric precautions including but not limited to vaginal bleeding, contractions, leaking of fluid and fetal movement were reviewed in detail with the patient. Please refer to After Visit Summary for other counseling recommendations.  Return in 1  week (on 07/26/2016). NST  Sipriano Fendley Colin Mulders Elyon Zoll, CNM

## 2016-07-20 ENCOUNTER — Inpatient Hospital Stay (HOSPITAL_COMMUNITY): Payer: 59 | Admitting: Anesthesiology

## 2016-07-20 ENCOUNTER — Inpatient Hospital Stay (HOSPITAL_COMMUNITY)
Admission: AD | Admit: 2016-07-20 | Discharge: 2016-07-22 | DRG: 775 | Disposition: A | Discharge status: Home / Self Care | Payer: 59 | Source: Ambulatory Visit | Attending: Obstetrics & Gynecology | Admitting: Obstetrics & Gynecology

## 2016-07-20 ENCOUNTER — Encounter (HOSPITAL_COMMUNITY): Payer: Self-pay | Admitting: *Deleted

## 2016-07-20 DIAGNOSIS — Z3403 Encounter for supervision of normal first pregnancy, third trimester: Secondary | ICD-10-CM | POA: Diagnosis present

## 2016-07-20 DIAGNOSIS — Z833 Family history of diabetes mellitus: Secondary | ICD-10-CM | POA: Diagnosis not present

## 2016-07-20 DIAGNOSIS — Z3A39 39 weeks gestation of pregnancy: Secondary | ICD-10-CM | POA: Diagnosis not present

## 2016-07-20 DIAGNOSIS — Z880 Allergy status to penicillin: Secondary | ICD-10-CM

## 2016-07-20 DIAGNOSIS — IMO0001 Reserved for inherently not codable concepts without codable children: Secondary | ICD-10-CM

## 2016-07-20 HISTORY — DX: Plantar fascial fibromatosis: M72.2

## 2016-07-20 LAB — CBC
HCT: 39.2 % (ref 36.0–46.0)
Hemoglobin: 13.7 g/dL (ref 12.0–15.0)
MCH: 26.9 pg (ref 26.0–34.0)
MCHC: 34.9 g/dL (ref 30.0–36.0)
MCV: 77 fL — ABNORMAL LOW (ref 78.0–100.0)
Platelets: 144 10*3/uL — ABNORMAL LOW (ref 150–400)
RBC: 5.09 MIL/uL (ref 3.87–5.11)
RDW: 14.7 % (ref 11.5–15.5)
WBC: 12 10*3/uL — ABNORMAL HIGH (ref 4.0–10.5)

## 2016-07-20 LAB — TYPE AND SCREEN
ABO/RH(D): O POS
ANTIBODY SCREEN: NEGATIVE

## 2016-07-20 LAB — RPR: RPR Ser Ql: NONREACTIVE

## 2016-07-20 LAB — ABO/RH: ABO/RH(D): O POS

## 2016-07-20 MED ORDER — OXYTOCIN 40 UNITS IN LACTATED RINGERS INFUSION - SIMPLE MED
1.0000 m[IU]/min | INTRAVENOUS | Status: DC
  Administered 2016-07-20: 2 m[IU]/min via INTRAVENOUS
  Filled 2016-07-20: qty 1000

## 2016-07-20 MED ORDER — LACTATED RINGERS IV SOLN: 500.0000 mL | INTRAVENOUS | Status: DC | PRN

## 2016-07-20 MED ORDER — ONDANSETRON HCL 4 MG PO TABS: 4.0000 mg | ORAL_TABLET | ORAL | Status: DC | PRN

## 2016-07-20 MED ORDER — LACTATED RINGERS IV SOLN
500.0000 mL | Freq: Once | INTRAVENOUS | Status: AC
  Administered 2016-07-20: 500 mL via INTRAVENOUS

## 2016-07-20 MED ORDER — WITCH HAZEL-GLYCERIN EX PADS
1.0000 "application " | MEDICATED_PAD | CUTANEOUS | Status: DC | PRN
  Administered 2016-07-20: 1 via TOPICAL

## 2016-07-20 MED ORDER — LACTATED RINGERS IV SOLN: 500.0000 mL | Freq: Once | INTRAVENOUS | Status: DC

## 2016-07-20 MED ORDER — IBUPROFEN 600 MG PO TABS
600.0000 mg | ORAL_TABLET | Freq: Four times a day (QID) | ORAL | Status: DC
  Administered 2016-07-20 – 2016-07-22 (×8): 600 mg via ORAL
  Filled 2016-07-20 (×8): qty 1

## 2016-07-20 MED ORDER — ZOLPIDEM TARTRATE 5 MG PO TABS: 5.0000 mg | ORAL_TABLET | Freq: Every evening | ORAL | Status: DC | PRN

## 2016-07-20 MED ORDER — SENNOSIDES-DOCUSATE SODIUM 8.6-50 MG PO TABS
2.0000 | ORAL_TABLET | ORAL | Status: DC
  Administered 2016-07-20 – 2016-07-21 (×2): 2 via ORAL
  Filled 2016-07-20 (×2): qty 2

## 2016-07-20 MED ORDER — PHENYLEPHRINE 40 MCG/ML (10ML) SYRINGE FOR IV PUSH (FOR BLOOD PRESSURE SUPPORT): 80.0000 ug | PREFILLED_SYRINGE | INTRAVENOUS | Status: DC | PRN

## 2016-07-20 MED ORDER — DOCUSATE SODIUM 100 MG PO CAPS
100.0000 mg | ORAL_CAPSULE | Freq: Two times a day (BID) | ORAL | Status: DC
  Administered 2016-07-20 – 2016-07-22 (×4): 100 mg via ORAL
  Filled 2016-07-20 (×4): qty 1

## 2016-07-20 MED ORDER — LIDOCAINE HCL (PF) 1 % IJ SOLN
INTRAMUSCULAR | Status: DC | PRN
  Administered 2016-07-20 (×2): 5 mL

## 2016-07-20 MED ORDER — TETANUS-DIPHTH-ACELL PERTUSSIS 5-2.5-18.5 LF-MCG/0.5 IM SUSP: 0.5000 mL | Freq: Once | INTRAMUSCULAR | Status: DC

## 2016-07-20 MED ORDER — DIPHENHYDRAMINE HCL 50 MG/ML IJ SOLN: 12.5000 mg | INTRAMUSCULAR | Status: DC | PRN

## 2016-07-20 MED ORDER — OXYTOCIN BOLUS FROM INFUSION
500.0000 mL | Freq: Once | INTRAVENOUS | Status: AC
  Administered 2016-07-20: 500 mL via INTRAVENOUS

## 2016-07-20 MED ORDER — EPHEDRINE 5 MG/ML INJ: 10.0000 mg | INTRAVENOUS | Status: DC | PRN

## 2016-07-20 MED ORDER — EPHEDRINE 5 MG/ML INJ
10.0000 mg | INTRAVENOUS | Status: DC | PRN
  Filled 2016-07-20: qty 4

## 2016-07-20 MED ORDER — COCONUT OIL OIL
1.0000 "application " | TOPICAL_OIL | Status: DC | PRN
  Administered 2016-07-21: 1 via TOPICAL
  Filled 2016-07-20: qty 120

## 2016-07-20 MED ORDER — LACTATED RINGERS IV SOLN
INTRAVENOUS | Status: DC
  Administered 2016-07-20: 09:00:00 via INTRAVENOUS

## 2016-07-20 MED ORDER — LACTATED RINGERS IV SOLN: INTRAVENOUS | Status: DC

## 2016-07-20 MED ORDER — OXYCODONE-ACETAMINOPHEN 5-325 MG PO TABS: 1.0000 | ORAL_TABLET | ORAL | Status: DC | PRN

## 2016-07-20 MED ORDER — ONDANSETRON HCL 4 MG/2ML IJ SOLN: 4.0000 mg | Freq: Four times a day (QID) | INTRAMUSCULAR | Status: DC | PRN

## 2016-07-20 MED ORDER — DIPHENHYDRAMINE HCL 25 MG PO CAPS: 25.0000 mg | ORAL_CAPSULE | Freq: Four times a day (QID) | ORAL | Status: DC | PRN

## 2016-07-20 MED ORDER — BENZOCAINE-MENTHOL 20-0.5 % EX AERO
1.0000 "application " | INHALATION_SPRAY | CUTANEOUS | Status: DC | PRN
  Administered 2016-07-20: 1 via TOPICAL
  Filled 2016-07-20: qty 56

## 2016-07-20 MED ORDER — ACETAMINOPHEN 325 MG PO TABS
650.0000 mg | ORAL_TABLET | ORAL | Status: DC | PRN
  Administered 2016-07-21 (×2): 650 mg via ORAL
  Filled 2016-07-20 (×2): qty 2

## 2016-07-20 MED ORDER — OXYCODONE-ACETAMINOPHEN 5-325 MG PO TABS: 2.0000 | ORAL_TABLET | ORAL | Status: DC | PRN

## 2016-07-20 MED ORDER — FENTANYL CITRATE (PF) 100 MCG/2ML IJ SOLN
100.0000 ug | INTRAMUSCULAR | Status: DC | PRN
  Administered 2016-07-20: 100 ug via INTRAVENOUS
  Filled 2016-07-20: qty 2

## 2016-07-20 MED ORDER — SIMETHICONE 80 MG PO CHEW: 80.0000 mg | CHEWABLE_TABLET | ORAL | Status: DC | PRN

## 2016-07-20 MED ORDER — TERBUTALINE SULFATE 1 MG/ML IJ SOLN
0.2500 mg | Freq: Once | INTRAMUSCULAR | Status: DC | PRN
  Filled 2016-07-20: qty 1

## 2016-07-20 MED ORDER — OXYCODONE HCL 5 MG PO TABS
5.0000 mg | ORAL_TABLET | ORAL | Status: DC | PRN
  Administered 2016-07-21: 5 mg via ORAL
  Filled 2016-07-20: qty 1

## 2016-07-20 MED ORDER — PHENYLEPHRINE 40 MCG/ML (10ML) SYRINGE FOR IV PUSH (FOR BLOOD PRESSURE SUPPORT)
80.0000 ug | PREFILLED_SYRINGE | INTRAVENOUS | Status: DC | PRN
  Filled 2016-07-20: qty 10
  Filled 2016-07-20: qty 5

## 2016-07-20 MED ORDER — SOD CITRATE-CITRIC ACID 500-334 MG/5ML PO SOLN
30.0000 mL | ORAL | Status: DC | PRN
  Administered 2016-07-20: 30 mL via ORAL
  Filled 2016-07-20: qty 15

## 2016-07-20 MED ORDER — DIBUCAINE 1 % RE OINT
1.0000 "application " | TOPICAL_OINTMENT | RECTAL | Status: DC | PRN
  Administered 2016-07-20: 1 via RECTAL
  Filled 2016-07-20: qty 28

## 2016-07-20 MED ORDER — ACETAMINOPHEN 325 MG PO TABS: 650.0000 mg | ORAL_TABLET | ORAL | Status: DC | PRN

## 2016-07-20 MED ORDER — PRENATAL MULTIVITAMIN CH
1.0000 | ORAL_TABLET | Freq: Every day | ORAL | Status: DC
  Administered 2016-07-21 – 2016-07-22 (×2): 1 via ORAL
  Filled 2016-07-20: qty 1

## 2016-07-20 MED ORDER — OXYTOCIN 40 UNITS IN LACTATED RINGERS INFUSION - SIMPLE MED: 2.5000 [IU]/h | INTRAVENOUS | Status: DC

## 2016-07-20 MED ORDER — FENTANYL 2.5 MCG/ML BUPIVACAINE 1/10 % EPIDURAL INFUSION (WH - ANES): 14.0000 mL/h | INTRAMUSCULAR | Status: DC | PRN

## 2016-07-20 MED ORDER — FENTANYL 2.5 MCG/ML BUPIVACAINE 1/10 % EPIDURAL INFUSION (WH - ANES)
14.0000 mL/h | INTRAMUSCULAR | Status: DC | PRN
  Administered 2016-07-20 (×3): 14 mL/h via EPIDURAL
  Filled 2016-07-20 (×2): qty 125

## 2016-07-20 MED ORDER — OXYCODONE HCL 5 MG PO TABS: 10.0000 mg | ORAL_TABLET | ORAL | Status: DC | PRN

## 2016-07-20 MED ORDER — MEASLES, MUMPS & RUBELLA VAC ~~LOC~~ INJ
0.5000 mL | INJECTION | Freq: Once | SUBCUTANEOUS | Status: DC
  Filled 2016-07-20: qty 0.5

## 2016-07-20 MED ORDER — ONDANSETRON HCL 4 MG/2ML IJ SOLN: 4.0000 mg | INTRAMUSCULAR | Status: DC | PRN

## 2016-07-20 MED ORDER — PHENYLEPHRINE 40 MCG/ML (10ML) SYRINGE FOR IV PUSH (FOR BLOOD PRESSURE SUPPORT)
80.0000 ug | PREFILLED_SYRINGE | INTRAVENOUS | Status: DC | PRN
  Filled 2016-07-20: qty 5

## 2016-07-20 MED ORDER — LIDOCAINE HCL (PF) 1 % IJ SOLN
30.0000 mL | INTRAMUSCULAR | Status: DC | PRN
Start: 2016-07-20 — End: 2016-07-20
  Filled 2016-07-20: qty 30

## 2016-07-20 NOTE — MAU Note (Signed)
PT  SAYS SHE WAS IN OFFICE  TODAY    1-2  CM.     DENIES HSV AND MRSA.  GBS- NEG

## 2016-07-20 NOTE — Anesthesia Postprocedure Evaluation (Signed)
Anesthesia Post Note  Patient: Dawn Wu  Procedure(s) Performed: * No procedures listed *  Patient location during evaluation: Mother Baby Anesthesia Type: Epidural Level of consciousness: awake and alert Pain management: satisfactory to patient Vital Signs Assessment: post-procedure vital signs reviewed and stable Respiratory status: respiratory function stable Cardiovascular status: stable Postop Assessment: no headache, no backache, epidural receding, patient able to bend at knees, no signs of nausea or vomiting and adequate PO intake Anesthetic complications: no     Last Vitals:  Vitals:   07/20/16 1803 07/20/16 1945  BP: (!) 98/57 108/62  Pulse: 100 97  Resp:  18  Temp: 37.3 C 36.9 C    Last Pain:  Vitals:   07/20/16 1945  TempSrc: Oral  PainSc: 0-No pain   Pain Goal:                 Dawn Wu

## 2016-07-20 NOTE — H&P (Signed)
LABOR AND DELIVERY ADMISSION HISTORY AND PHYSICAL NOTE  Dawn Wu is a 30 y.o. female G1P0 with IUP at [redacted]w[redacted]d by 12-week U/S presenting for SOL. She reports contractions since yesterday morning, initially able to sleep through them, but they have been increasing in frequency, and have been stronger since around 8pm this evening.  She reports positive fetal movement. She denies leakage of fluid or vaginal bleeding.  Prenatal History/Complications: Clinic Transfer to [facility] Prenatal Labs  Dating 12 week [name] Blood type: O/Positive/-- (02/10 0000)   Genetic Screen 1 Screen:    AFP:NML     Quad:     NIPS: Antibody:Negative (02/10 0000)  Anatomic [name] Nml female Rubella: Immune (02/10 0000)  GTT Early:  133 Third trimester:156 3 hr GTT-F-82 1hr 174 2hr 144 3 hr100 (nml) RPR: Nonreactive (02/10 0000)   Flu vaccine  October 2016 HBsAg: Negative (02/10 0000)   TDaP vaccine  04/17/16                                            HIV: Non-reactive (02/10 0000)   Baby Food  Breast                                            GBS:   Neg (For PCN allergy, check sensitivities)  Neg  Contraception POPs Pap: ASCUS, neg HRHPV 12/28/15  Circumcision patient circ   Pediatrician  Marcy Panning   Support Person Donald Pore (partner)    - Obesity - ASCUS pap  Past Medical History: Past Medical History:  Diagnosis Date  . Headache   . Hyperlipidemia   . Infertility management   . PCOS (polycystic ovarian syndrome)     Past Surgical History: Past Surgical History:  Procedure Laterality Date  . WISDOM TOOTH EXTRACTION      Obstetrical History: OB History    Gravida Para Term Preterm AB Living   1             SAB TAB Ectopic Multiple Live Births                  Social History: Social History   Social History  . Marital status: Single    Spouse name: N/A  . Number of children: N/A  . Years of education: N/A   Occupational History  . RN    Social History Main Topics  . Smoking status: Never Smoker  .  Smokeless tobacco: Never Used  . Alcohol use 0.0 oz/week     Comment: occassional  . Drug use: No  . Sexual activity: Yes    Partners: Male   Other Topics Concern  . None   Social History Narrative  . None    Family History: Family History  Problem Relation Age of Onset  . Diabetes Mother   . Hyperlipidemia Mother   . Diabetes Father   . Hyperlipidemia Father   . Cancer Maternal Aunt     uterine/Ovarian    Allergies: Allergies  Allergen Reactions  . Nuvaring [Etonogestrel-Ethinyl Estradiol] Hives  . Shellfish Allergy     Prescriptions Prior to Admission  Medication Sig Dispense Refill Last Dose  . cetirizine (ZYRTEC) 10 MG tablet Take 10 mg by mouth daily as needed for allergies.    Taking  .  EPINEPHrine 0.3 mg/0.3 mL IJ SOAJ injection   1 Not Taking  . Prenatal Vit-Fe Fumarate-FA (PRENATAL VITAMIN PO) Take 1 tablet by mouth daily.    Taking  . ranitidine (ZANTAC) 150 MG tablet Take one each evening to prevent reflux. (Patient taking differently: Take 150 mg by mouth 2 (two) times daily. Take one each evening to prevent reflux.) 90 tablet 1 Taking     Review of Systems  All systems reviewed and negative except as stated in HPI  Blood pressure 122/84, pulse 72, temperature 98.1 F (36.7 C), temperature source Oral, resp. rate 18, height 5\' 2"  (1.575 m), weight 190 lb 4 oz (86.3 kg). General appearance: alert, cooperative and no distress Lungs: normal WOB without any respiratory distress Heart: regular rate and rhythm Abdomen: soft, non-tender Extremities: No calf swelling or tenderness Presentation: cephalic by RN exam Fetal monitoring: baseline rate 130, moderate variability, +acel, no decel Uterine activity: ctx every 4 minutes Dilation: 3.5 Effacement (%): 80 Station: -2 Exam by:: Remigio EisenmengerBENJI STANLEY RN   Prenatal labs: ABO, Rh: O/Positive/-- (02/10 0000) Antibody: Negative (02/10 0000) Rubella: !Error! RPR: NON REAC (06/14 0849)  HBsAg: Negative (02/10  0000)  HIV: NONREACTIVE (06/14 0849)  GBS:   negative 1 hr Glucola: early 133; 3rd trimester: 156 3hr GTT: Fasting 82, 1hr 174, 2hr 144, 3 hr 100 (normal) Genetic screening:  normal Anatomy US: normal female  Prenatal Transfer Tool  Maternal Diabetes: No Genetic Screening: Normal Maternal Ultrasounds/Referrals: Normal Fetal Ultrasounds or other Referrals:  None Maternal Substance Abuse:  No Significant Maternal Medications:  None Significant Maternal Lab Results: Lab values include: Other: Abnormal 1-hr glucola with normal GTT  Results for orders placed or performed during the hospital encounter of 07/20/16 (from the past 24 hour(s))  CBC   Collection Time: 07/20/16  1:30 AM  Result Value Ref Range   WBC 12.0 (H) 4.0 - 10.5 K/uL   RBC 5.09 3.87 - 5.11 MIL/uL   Hemoglobin 13.7 12.0 - 15.0 g/dL   HCT 60.439.2 54.036.0 - 98.146.0 %   MCV 77.0 (L) 78.0 - 100.0 fL   MCH 26.9 26.0 - 34.0 pg   MCHC 34.9 30.0 - 36.0 g/dL   RDW 19.114.7 47.811.5 - 29.515.5 %   Platelets 144 (L) 150 - 400 K/uL    Patient Active Problem List   Diagnosis Date Noted  . Active labor at term 07/20/2016  . Supervision of normal pregnancy, antepartum 01/22/2016  . Atypical squamous cells of undetermined significance (ASCUS) on Papanicolaou smear of cervix 01/22/2016  . Allergic conjunctivitis and rhinitis 07/15/2015  . Class 1 obesity 04/07/2014  . Plantar fasciitis 01/28/2014    Assessment: Dawn Wu is a 30 y.o. G1P0 at 2644w5d here for SOL.   #Labor: Expectant management. May augment if needed #Pain: IV pain meds prn. May have epidural when in active labor  #FWB: Category I #ID:  GBS negative #MOF: breast #MOC: POPs #Circ:  Yes, inpatient   Kandra NicolasJulie P Degele 07/20/2016, 2:48 AM   CNM attestation:   Dawn Wu is a 30 y.o. G1P0 here for latent labor  PE: BP 114/74   Pulse 84   Temp 98.2 F (36.8 C) (Oral)   Resp 18   Ht 5\' 2"  (1.575 m)   Wt 86.3 kg (190 lb 4 oz)   BMI 34.80 kg/m   Resp: normal  effort, no distress Abd: gravid  ROS, labs, PMH reviewed  [facility] Expectant management- may need to augment Anticipate SVD  Cam Hai CNM 07/20/2016, 9:14 AM

## 2016-07-20 NOTE — Anesthesia Procedure Notes (Signed)
Epidural Patient location during procedure: OB Start time: 07/20/2016 4:50 AM End time: 07/20/2016 4:53 AM  Staffing Anesthesiologist: Bonita QuinGUIDETTI, Dawn Graca S Performed: anesthesiologist   Preanesthetic Checklist Completed: patient identified, site marked, surgical consent, pre-op evaluation, timeout performed, IV checked, risks and benefits discussed and monitors and equipment checked  Epidural Patient position: sitting Prep: site prepped and draped and DuraPrep Patient monitoring: continuous pulse ox and blood pressure Approach: midline Location: L4-L5 Injection technique: LOR air  Needle:  Needle type: Tuohy  Needle gauge: 17 G Needle length: 9 cm and 9 Needle insertion depth: 5 cm cm Catheter type: closed end flexible Catheter size: 19 Gauge Catheter at skin depth: 10 cm Test dose: negative  Assessment Events: blood not aspirated, injection not painful, no injection resistance, negative IV test and no paresthesia

## 2016-07-20 NOTE — Anesthesia Pain Management Evaluation Note (Signed)
  CRNA Pain Management Visit Note  Patient: Dawn Wu, 30 y.o., female  "Hello I am a [facility] the [facility]. We have an anesthesia team available at all times to provide care throughout the hospital, including epidural management and anesthesia for C-section. I don't know your plan for the delivery whether it a natural birth, water birth, IV sedation, nitrous supplementation, doula or epidural, but we want to meet your pain goals."   1.Was your pain managed to your expectations on prior hospitalizations?   No prior hospitalizations  2.What is your expectation for pain management during this hospitalization?     Epidural  3.How can we help you reach that goal? Epidural in situ.  Record the patient's initial score and the patient's pain goal.   Pain: 0  Pain Goal: 5 [facility] wants you to be able to say your pain was always managed very well.  Dayyan Krist L 07/20/2016

## 2016-07-20 NOTE — Anesthesia Preprocedure Evaluation (Signed)
Anesthesia Evaluation  Patient identified by MRN, date of birth, ID band Patient awake    Reviewed: Allergy & Precautions, NPO status , Patient's Chart, lab work & pertinent test results  Airway Mallampati: II  TM Distance: >3 FB Neck ROM: Full    Dental no notable dental hx.    Pulmonary neg pulmonary ROS,    Pulmonary exam normal        Cardiovascular negative cardio ROS Normal cardiovascular exam     Neuro/Psych negative neurological ROS  negative psych ROS   GI/Hepatic negative GI ROS, Neg liver ROS,   Endo/Other  negative endocrine ROS  Renal/GU negative Renal ROS  negative genitourinary   Musculoskeletal negative musculoskeletal ROS (+)   Abdominal   Peds negative pediatric ROS (+)  Hematology negative hematology ROS (+)   Anesthesia Other Findings   Reproductive/Obstetrics (+) Pregnancy                             Anesthesia Physical Anesthesia Plan  ASA: II  Anesthesia Plan: Epidural   Post-op Pain Management:    Induction:   Airway Management Planned:   Additional Equipment:   Intra-op Plan:   Post-operative Plan:   Informed Consent:   Plan Discussed with:   Anesthesia Plan Comments:         Anesthesia Quick Evaluation  

## 2016-07-20 NOTE — Progress Notes (Signed)
Patient ID: Dawn Wu, female   DOB: November 19, 1985, 30 y.o.   MRN: [id number] Dawn Wu is a 30 y.o. G1P0 at 3734w5d admitted for active labor  Subjective: Doing well, comfortable w/ epidural  Objective: BP 114/74   Pulse 84   Temp 98.2 F (36.8 C) (Oral)   Resp 18   Ht 5\' 2"  (1.575 m)   Wt 86.3 kg (190 lb 4 oz)   BMI 34.80 kg/m  No intake/output data recorded.  FHT:  FHR: 125 bpm, variability: moderate,  accelerations:  Present,  decelerations:  Absent UC:   q 2-5 minutes  SVE:   6/90/0 AROM small amt clear fluid  Labs: Lab Results  Component Value Date   WBC 12.0 (H) 07/20/2016   HGB 13.7 07/20/2016   HCT 39.2 07/20/2016   MCV 77.0 (L) 07/20/2016   PLT 144 (L) 07/20/2016    Assessment / Plan: Spontaneous labor, progressing normally , now arom'd  Labor: Progressing normally Fetal Wellbeing:  Category I Pain Control:  Epidural Pre-eclampsia: n/a I/D:  neg Anticipated MOD:  NSVD  Marge DuncansBooker, Kimberly Randall CNM, WHNP-BC 07/20/2016, 9:16 AM

## 2016-07-20 NOTE — Progress Notes (Signed)
Patient ID: Dawn AlmMildred Seal, female   DOB: Mar 06, 1986, 30 y.o.   MRN: [id number]  Dawn Wu is a 30 y.o. G1P0 at [redacted]w[redacted]d admitted for active labor  Subjective: Comfortable w/ epidural, no urge to push  Objective: BP 105/63   Pulse (!) 101   Temp 97.8 F (36.6 C) (Oral)   Resp 18   Ht 5\' 2"  (1.575 m)   Wt 86.3 kg (190 lb 4 oz)   BMI 34.80 kg/m  Total I/O In: -  Out: 925 [Urine:925]  FHT:  FHR: 125 bpm, variability: moderate,  accelerations:  Present,  decelerations:  Absent UC:   q 2-523mins  SVE:   Dilation: 10 Effacement (%): 100 Station: 0, +1 Exam by:: Lajuana Matteina Jacobs, RN  Pitocin @ 4 mu/min  Labs: Lab Results  Component Value Date   WBC 12.0 (H) 07/20/2016   HGB 13.7 07/20/2016   HCT 39.2 07/20/2016   MCV 77.0 (L) 07/20/2016   PLT 144 (L) 07/20/2016    Assessment / Plan: Complete w/o urge to push, had recently started pitocin augmentation d/t uc's spacing out. Will labor down  Labor: Progressing normally Fetal Wellbeing:  Category I Pain Control:  Epidural Pre-eclampsia: n/a I/D:  gbs neg Anticipated MOD:  NSVD  Marge DuncansBooker, Dawn Wu CNM, WHNP-BC 07/20/2016, 1:22 PM

## 2016-07-21 NOTE — Lactation Note (Signed)
This note was copied from a baby's chart. Lactation Consultation Note  Patient Name: Dawn Wu Reason for consult: Initial assessment   Initial consult with first time mom of 24 hour old infant. Infant with 4 BF for 20-30 minutes, 4 attempts, 4 voids and 2 stools since birth. Mom reports she is using a # 20 NS with each feeding as infant unable to latch without it. Infant weight 7 lb 5.8 oz. LATCH Scores 6. Infant being held by grandmother and just finished feeding. Maternal history of PCOS and Infertility. Mom is a Producer, [employer] Employee with Asthma and Allergy and plans to pick up her pump at d/c.   Discussed with mom risks of lower milk supply with use of NS and would recommended starting pumping 4 x a day post BF to help establish a supply. Mom agreeable. DEBP set up, mom was shown assembling, disassembling, and cleaning of pump parts. Showed mom how to double pump on Initiate setting. Mom began pumping and she had a good amount of colostrum to the left breast. She just fed him on the right breast prior to pumping. Advised mom that all EBM should be fed back to baby. Discussed spoon feeding and priming NS with EBM with feedings, spoons and curved tip syringe left in room.   Discussed with mom that the goal is to wean off the NS as able, enc mom to try once a day to see if he can latch without it and that we generally recommend an OP appt for reevalutation after d/c.   Mom reports she feels feedings have improved, enc her to call for feeding assessment. Mom voiced understanding. Follow up tomorrow and prn.   Maternal Data Formula Feeding for Exclusion: No Has patient been taught Hand Expression?: Yes Does the patient have breastfeeding experience prior to this delivery?: No  Feeding Feeding Type: Breast Fed Length of feed: 20 min  LATCH Score/Interventions Latch: Repeated attempts needed to sustain latch, nipple held in mouth throughout feeding, stimulation  needed to elicit sucking reflex.  Audible Swallowing: A few with stimulation  Type of Nipple: Flat Intervention(s): Hand pump (nipple shield)  Comfort (Breast/Nipple): Soft / non-tender     Hold (Positioning): Assistance needed to correctly position infant at breast and maintain latch.  LATCH Score: 6  Lactation Tools Discussed/Used WIC Program: No Pump Review: Setup, frequency, and cleaning;Milk Storage Initiated by:: Noralee StainSharon Hice, RN, IBCLC Date initiated:: 07/21/16   Consult Status Consult Status: Follow-up Date: 07/22/16 Follow-up type: In-patient    Dawn Wu Wu, 5:19 PM

## 2016-07-21 NOTE — Progress Notes (Signed)
Post Partum Day 1 Subjective: Eating, drinking, voiding, ambulating well.  +flatus.  Lochia and pain wnl.  Denies dizziness, lightheadedness, or sob. No complaints.   Objective: Blood pressure (!) 103/50, pulse 91, temperature 98 F (36.7 C), temperature source Oral, resp. rate 18, height 5\' 2"  (1.575 m), weight 86.3 kg (190 lb 4 oz), unknown if currently breastfeeding.  Physical Exam:  General: alert, cooperative and no distress Lochia: appropriate Uterine Fundus: firm Incision: n/a DVT Evaluation: No evidence of DVT seen on physical exam. Negative Homan's sign. No cords or calf tenderness. No significant calf/ankle edema.   Recent Labs  07/20/16 0130  HGB 13.7  HCT 39.2    Assessment/Plan: Plan for discharge tomorrow and Breastfeeding  Plans for POPs, still deciding about circumcision- will let us know   LOS: 1 day   Marge DuncansBooker, Randell Detter Randall 07/21/2016, 8:22 AM

## 2016-07-22 ENCOUNTER — Encounter: Payer: 59 | Admitting: Obstetrics & Gynecology

## 2016-07-22 MED ORDER — IBUPROFEN 600 MG PO TABS: 600.0000 mg | ORAL_TABLET | Freq: Four times a day (QID) | ORAL | 0 refills | Status: DC

## 2016-07-22 NOTE — Progress Notes (Deleted)
OB Discharge Summary     Patient Name: Dawn Wu DOB: 1986-07-21 MRN: [id number]  Date of admission: 07/20/2016 Delivering MD: Jaynie Collins A   Date of discharge: 07/22/2016  Admitting diagnosis: 39 plus 5 contractions 4 to 5 minutes apart Intrauterine pregnancy: [redacted]w[redacted]d     Secondary diagnosis:  Principal Problem:   s/p vaginal delivery  Additional problems: n/a     Discharge diagnosis: Term Pregnancy Delivered                                                                                                Post partum procedures:n/a  Augmentation: Pitocin  Complications: None  Hospital course:  Onset of Labor With Vaginal Delivery     30 y.o. yo G1P1001 at [redacted]w[redacted]d was admitted in Active Labor on 07/20/2016. Patient had an uncomplicated labor course as follows:  Membrane Rupture Time/Date: 9:13 AM ,07/20/2016   Intrapartum Procedures: Episiotomy:                                           Lacerations:  2nd degree [3];Perineal [11]  Patient had a delivery of a Viable infant. 07/20/2016  Information for the patient's newborn:  Gael, Delude [[id number]]  Delivery Method: Vaginal, Spontaneous Delivery (Filed from Delivery Summary)    Pateint had an uncomplicated postpartum course.  She is ambulating, tolerating a regular diet, passing flatus, and urinating well. Patient is discharged home in stable condition on 07/22/16.    Physical exam Vitals:   07/20/16 2351 07/21/16 0855 07/21/16 1844 07/22/16 0500  BP: (!) 103/50 105/60 (!) 106/57 109/64  Pulse: 91 92 78 89  Resp: 18 18 18 18   Temp: 98 F (36.7 C) 98 F (36.7 C) 98.3 F (36.8 C) 97.1 F (36.2 C)  TempSrc: Oral Oral  Oral  Weight:      Height:       General: alert, cooperative and no distress Lochia: appropriate Uterine Fundus: firm Incision: N/A DVT Evaluation: No evidence of DVT seen on physical exam. Negative Homan's sign. No cords or calf tenderness. No significant calf/ankle edema. Labs: Lab  Results  Component Value Date   WBC 12.0 (H) 07/20/2016   HGB 13.7 07/20/2016   HCT 39.2 07/20/2016   MCV 77.0 (L) 07/20/2016   PLT 144 (L) 07/20/2016   CMP Latest Ref Rng & Units 05/24/2016  Glucose 65 - 99 mg/dL 95  BUN 6 - 20 mg/dL 11  Creatinine 1.02 - 7.25 mg/dL 3.66  Sodium 440 - 347 mmol/L 134(L)  Potassium 3.5 - 5.1 mmol/L 3.7  Chloride 101 - 111 mmol/L 105  CO2 22 - 32 mmol/L 20(L)  Calcium 8.9 - 10.3 mg/dL 9.1  Total Protein 6.5 - 8.1 g/dL 6.6  Total Bilirubin 0.3 - 1.2 mg/dL <4.2(V)  Alkaline Phos 38 - 126 U/L 102  AST 15 - 41 U/L 24  ALT 14 - 54 U/L 27    Discharge instruction: per After Visit Summary and "Baby and Me Booklet".  Diet: routine diet  Activity: Advance as tolerated. Pelvic rest for 6 weeks.   Outpatient follow up:6 weeks Follow up Appt:Future Appointments Date Time Provider Department Center  07/22/2016 3:15 PM Lesly DukesKelly H Leggett, MD [facility]   Follow up Visit:No Follow-up on file.  Postpartum contraception: Progesterone only pills  Newborn Data: Live born female  Birth Weight: 7 lb 6.2 oz (3350 g) APGAR: 8, 9  Baby Feeding: Breast Disposition:home with mother   07/22/2016 Renetta ChalkAshley Felecia Stanfill, Student-MidWife

## 2016-07-22 NOTE — Progress Notes (Signed)
POSTPARTUM PROGRESS NOTE  Post Partum Day 2 Subjective:  Dawn Wu is a 30 y.o. G1P1001 5826w5d s/p SVD.  No acute events overnight.  Pt denies problems with ambulating, voiding or po intake.  She denies nausea or vomiting.  Pain is well controlled.  She has had flatus. She has had a bowel movement.  Lochia minimal.  Objective: Blood pressure 109/64, pulse 89, temperature 97.1 F (36.2 C), temperature source Oral, resp. rate 18, height 5\' 2"  (1.575 m), weight 86.3 kg (190 lb 4 oz), unknown if currently breastfeeding.  Physical Exam:  General: alert, cooperative and no distress Lochia:normal flow Chest: CTAB Heart: RRR no m/r/g Abdomen: +BS, soft, nontender Uterine Fundus: firm DVT Evaluation: No calf swelling or tenderness Extremities: Minimal edema   Recent Labs  07/20/16 0130  HGB 13.7  HCT 39.2    Assessment/Plan:  ASSESSMENT: Dawn AlmMildred Calia is a 30 y.o. G1P1001 8626w5d s/p SVD. Doing well. Breastfeeding.  Plan for discharge today.   LOS: 2 days   Franki CabotZachary L Annaleise Burger, Medical Student 07/22/2016, 7:28 AM

## 2016-07-22 NOTE — Discharge Summary (Signed)
OB Discharge Summary                           Patient Name: Dawn Wu DOB: 13-Apr-1986 MRN: [id number]  Date of admission: 07/20/2016 Delivering MD: Jaynie Collins A   Date of discharge: 07/22/2016  Admitting diagnosis: 39 plus 5 contractions 4 to 5 minutes apart Intrauterine pregnancy: [redacted]w[redacted]d     Secondary diagnosis:  Principal Problem:   s/p vaginal delivery  Additional problems: n/a                                      Discharge diagnosis: Term Pregnancy Delivered                                                                                                Post partum procedures:n/a  Augmentation: Pitocin  Complications: None  Hospital course:  Onset of Labor With Vaginal Delivery     30 y.o. yo G1P1001 at [redacted]w[redacted]d was admitted in Active Labor on 07/20/2016. Patient had an uncomplicated labor course as follows:  Membrane Rupture Time/Date: 9:13 AM ,07/20/2016   Intrapartum Procedures: Episiotomy:                                           Lacerations:  2nd degree [3];Perineal [11]  Patient had a delivery of a Viable infant. 07/20/2016  Information for the patient's newborn:  Minaal, Struckman [[id number]]  Delivery Method: Vaginal, Spontaneous Delivery (Filed from Delivery Summary)    Pateint had an uncomplicated postpartum course.  She is ambulating, tolerating a regular diet, passing flatus, and urinating well. Patient is discharged home in stable condition on 07/22/16.          Physical exam Vitals:   07/20/16 2351 07/21/16 0855 07/21/16 1844 07/22/16 0500  BP: (!) 103/50 105/60 (!) 106/57 109/64  Pulse: 91 92 78 89  Resp: 18 18 18 18   Temp: 98 F (36.7 C) 98 F (36.7 C) 98.3 F (36.8 C) 97.1 F (36.2 C)  TempSrc: Oral Oral  Oral  Weight:      Height:       General: alert, cooperative and no distress Lochia: appropriate Uterine Fundus: firm Incision: N/A DVT Evaluation: No evidence of DVT seen on physical exam. Negative Homan's  sign. No cords or calf tenderness. No significant calf/ankle edema. Labs: Recent Labs       Lab Results  Component Value Date   WBC 12.0 (H) 07/20/2016   HGB 13.7 07/20/2016   HCT 39.2 07/20/2016   MCV 77.0 (L) 07/20/2016   PLT 144 (L) 07/20/2016     CMP Latest Ref Rng & Units 05/24/2016  Glucose 65 - 99 mg/dL 95  BUN 6 - 20 mg/dL 11  Creatinine 3.01 - 6.01 mg/dL 0.93  Sodium 235 - 573 mmol/L 134(L)  Potassium 3.5 - 5.1 mmol/L 3.7  Chloride 101 - 111 mmol/L  105  CO2 22 - 32 mmol/L 20(L)  Calcium 8.9 - 10.3 mg/dL 9.1  Total Protein 6.5 - 8.1 g/dL 6.6  Total Bilirubin 0.3 - 1.2 mg/dL <1.6(X<0.1(L)  Alkaline Phos 38 - 126 U/L 102  AST 15 - 41 U/L 24  ALT 14 - 54 U/L 27    Discharge instruction: per After Visit Summary and "Baby and Me Booklet".   Diet: routine diet  Activity: Advance as tolerated. Pelvic rest for 6 weeks.   Outpatient follow up:6 weeks Follow up Appt:Future Appointments Date Time Provider Department Center  07/22/2016 3:15 PM Lesly DukesKelly H Leggett, MD [facility]   Follow up Visit:No Follow-up on file.  Postpartum contraception: Progesterone only pills  Newborn Data: Live born female  Birth Weight: 7 lb 6.2 oz (3350 g) APGAR: 8, 9  Baby Feeding: Breast Disposition:home with mother   07/22/2016 Renetta ChalkAshley Ellis, Student-MidWife

## 2016-07-22 NOTE — Lactation Note (Signed)
This note was copied from a baby's chart. Lactation Consultation Note  Mother recently pumped and gave baby 5 ml of colostrum in addition to breastfeeding for 20 min. Encouraged breastfeeding before offering formula. Mother is using #16NS.  Provided her with an additional #16& #20NS. Suggest she breasteed and post pump 4-5 times a day with DEBP for 10-15 in and give volume back to baby at next feeding. Reviewed engorgement care and monitoring voids/stools. Mom encouraged to feed baby 8-12 times/24 hours and with feeding cues.    Patient Name: Boy Bennye AlmMildred Denne ZOXWR'UToday's Date: 07/22/2016 Reason for consult: Follow-up assessment   Maternal Data    Feeding Feeding Type: Breast Fed Nipple Type: Slow - flow Length of feed: 20 min  LATCH Score/Interventions Latch: Grasps breast easily, tongue down, lips flanged, rhythmical sucking.  Audible Swallowing: Spontaneous and intermittent  Type of Nipple: Everted at rest and after stimulation Intervention(s): Shells;Double electric pump  Comfort (Breast/Nipple): Filling, red/small blisters or bruises, mild/mod discomfort  Problem noted: Mild/Moderate discomfort  Hold (Positioning): No assistance needed to correctly position infant at breast.  LATCH Score: 9  Lactation Tools Discussed/Used Nipple shield size: 16   Consult Status Consult Status: Complete    Hardie PulleyBerkelhammer, Ruth Boschen 07/22/2016, 10:52 AM

## 2016-07-22 NOTE — Lactation Note (Signed)
This note was copied from a baby's chart. Lactation Consultation Note Mom upset feeling like baby isn't getting enough. Baby had 7% weight loss. Mom has flat nipples and has been trying to latch baby at times w/o NS. Mom had #20 NS, notes colostrum in NS after BF. Mom states she can BF w/o NS at times. Has shells to wear in bra during the day. Has DEBP, and hand pump.  Fitted mom #16 NS. Inserted colostrum that was hand expressed. Baby latched in football position and BF great. Encouraged mom to feel breast before and after BF. Noted much softening. Baby sleeping soundly after BF. Discussed cluster feeding, supply and demand. Encouraged mom to not to BF w/o NS until nipple everts more. Patient Name: Dawn Bennye AlmMildred Laforte ZOXWR'UToday's Date: 07/22/2016 Reason for consult: Follow-up assessment;Infant weight loss   Maternal Data    Feeding Feeding Type: Breast Fed Length of feed: 20 min  LATCH Score/Interventions Latch: Grasps breast easily, tongue down, lips flanged, rhythmical sucking. Intervention(s): Adjust position;Assist with latch;Breast massage;Breast compression  Audible Swallowing: Spontaneous and intermittent Intervention(s): Skin to skin;Hand expression  Type of Nipple: Flat Intervention(s): Shells;Double electric pump;Hand pump  Comfort (Breast/Nipple): Filling, red/small blisters or bruises, mild/mod discomfort  Problem noted: Filling Interventions (Filling): Massage;Frequent nursing;Hand pump;Double electric pump  Hold (Positioning): Assistance needed to correctly position infant at breast and maintain latch. Intervention(s): Breastfeeding basics reviewed;Support Pillows;Position options;Skin to skin  LATCH Score: 7  Lactation Tools Discussed/Used Tools: Shells;Nipple Dorris CarnesShields;Pump Nipple shield size: 16 Shell Type: Inverted Breast pump type: Double-Electric Breast Pump   Consult Status Consult Status: Follow-up Date: 07/22/16 (in pm) Follow-up type:  In-patient    Charyl DancerCARVER, Mehar Kirkwood G 07/22/2016, 4:24 AM

## 2016-07-24 ENCOUNTER — Encounter: Payer: Self-pay | Admitting: *Deleted

## 2016-07-26 ENCOUNTER — Ambulatory Visit (HOSPITAL_COMMUNITY): Admission: RE | Admit: 2016-07-26 | Payer: 59 | Source: Ambulatory Visit

## 2016-07-27 NOTE — Discharge Summary (Signed)
OB Discharge Summary                           Patient Name: Dawn Wu DOB: 10/27/1986 MRN: [id number]  Date of admission: 07/20/2016 Delivering MD: ANYANWU, UGONNA A   Date of discharge: 07/22/2016  Admitting diagnosis: 39 plus 5 contractions 4 to 5 minutes apart Intrauterine pregnancy: [redacted]w[redacted]d     Secondary diagnosis:  Principal Problem:   s/p vaginal delivery  Additional problems: n/a                                      Discharge diagnosis: Term Pregnancy Delivered                                                                                                Post partum procedures:n/a  Augmentation: Pitocin  Complications: None  Hospital course:  Onset of Labor With Vaginal Delivery     30 y.o. yo G1P1001 at [redacted]w[redacted]d was admitted in Active Labor on 07/20/2016. Patient had an uncomplicated labor course as follows:  Membrane Rupture Time/Date: 9:13 AM ,07/20/2016   Intrapartum Procedures: Episiotomy:                                           Lacerations:  2nd degree [3];Perineal [11]  Patient had a delivery of a Viable infant. 07/20/2016  Information for the patient's newborn:  Gohr, Boy Anisah [[id number]]  Delivery Method: Vaginal, Spontaneous Delivery (Filed from Delivery Summary)    Pateint had an uncomplicated postpartum course.  She is ambulating, tolerating a regular diet, passing flatus, and urinating well. Patient is discharged home in stable condition on 07/22/16.          Physical exam Vitals:   07/20/16 2351 07/21/16 0855 07/21/16 1844 07/22/16 0500  BP: (!) 103/50 105/60 (!) 106/57 109/64  Pulse: 91 92 78 89  Resp: 18 18 18 18  Temp: 98 F (36.7 C) 98 F (36.7 C) 98.3 F (36.8 C) 97.1 F (36.2 C)  TempSrc: Oral Oral  Oral  Weight:      Height:       General: alert, cooperative and no distress Lochia: appropriate Uterine Fundus: firm Incision: N/A DVT Evaluation: No evidence of DVT seen on physical exam. Negative Homan's  sign. No cords or calf tenderness. No significant calf/ankle edema. Labs: Recent Labs       Lab Results  Component Value Date   WBC 12.0 (H) 07/20/2016   HGB 13.7 07/20/2016   HCT 39.2 07/20/2016   MCV 77.0 (L) 07/20/2016   PLT 144 (L) 07/20/2016     CMP Latest Ref Rng & Units 05/24/2016  Glucose 65 - 99 mg/dL 95  BUN 6 - 20 mg/dL 11  Creatinine 0.44 - 1.00 mg/dL 0.55  Sodium 135 - 145 mmol/L 134(L)  Potassium 3.5 - 5.1 mmol/L 3.7  Chloride 101 - 111 mmol/L 

## 2016-08-19 ENCOUNTER — Ambulatory Visit: Payer: 59 | Admitting: Obstetrics & Gynecology

## 2016-09-02 ENCOUNTER — Ambulatory Visit (INDEPENDENT_AMBULATORY_CARE_PROVIDER_SITE_OTHER): Payer: 59 | Admitting: Obstetrics & Gynecology

## 2016-09-02 ENCOUNTER — Encounter: Payer: Self-pay | Admitting: Obstetrics & Gynecology

## 2016-09-02 NOTE — Progress Notes (Signed)
Subjective:     Dawn Wu is a 30 y.o.MH 13P1 (son)  female who presents for a postpartum visit. She is 6 weeks postpartum following a spontaneous vaginal delivery. I have fully reviewed the prenatal and intrapartum course. The delivery was at 39+ gestational weeks. Outcome: spontaneous vaginal delivery. Anesthesia: epidural. Postpartum course has been normal. Baby's course has been Normal . Baby is feeding by breast. She is pumping exclusively.  Bleeding staining only. Bowel function is normal. Bladder function is normal. Patient is sexually active. Contraception method is condoms. Postpartum depression screening: negative.  The following portions of the patient's history were reviewed and updated as appropriate: allergies, current medications, past family history, past medical history, past social history, past surgical history and problem list.  Review of Systems Pertinent items are noted in HPI.   Objective:    BP 106/65   Pulse 84   Ht 5\' 2"  (1.575 m)   Wt 164 lb (74.4 kg)   Breastfeeding? Yes   BMI 30.00 kg/m   General:  alert   Breasts:  inspection negative, no nipple discharge or bleeding, no masses or nodularity palpable  Lungs: clear to auscultation bilaterally  Heart:  regular rate and rhythm, S1, S2 normal, no murmur, click, rub or gallop  Abdomen: soft, non-tender; bowel sounds normal; no masses,  no organomegaly   Vulva:  normal, she has a small tear (after sex 3 days ago), no sign of infection  Vagina: normal vagina  Cervix:  anteverted  Corpus: normal size, contour, position, consistency, mobility, non-tender  Adnexa:  no mass, fullness, tenderness  Rectal Exam: Not performed.        Assessment:     Normal  postpartum exam. Pap smear not done at today's visit.   Plan:    1. Contraception: condoms  Follow up in: 6 months or as needed.

## 2016-09-12 ENCOUNTER — Telehealth: Payer: Self-pay | Admitting: *Deleted

## 2016-09-12 ENCOUNTER — Ambulatory Visit: Payer: 59 | Admitting: Obstetrics & Gynecology

## 2016-09-12 ENCOUNTER — Other Ambulatory Visit: Payer: Self-pay | Admitting: *Deleted

## 2016-09-12 ENCOUNTER — Telehealth: Payer: Self-pay

## 2016-09-12 DIAGNOSIS — N61 Mastitis without abscess: Secondary | ICD-10-CM

## 2016-09-12 MED ORDER — DICLOXACILLIN SODIUM 250 MG PO CAPS: 250.0000 mg | ORAL_CAPSULE | Freq: Four times a day (QID) | ORAL | 0 refills | Status: DC

## 2016-09-12 MED ORDER — DICLOXACILLIN SODIUM 250 MG PO CAPS
250.0000 mg | ORAL_CAPSULE | Freq: Four times a day (QID) | ORAL | 0 refills | Status: DC
Start: 2016-09-12 — End: 2016-09-12

## 2016-09-12 NOTE — Telephone Encounter (Signed)
Pt's pharmacy did not carry Dicloxicillin and therefore [facility]

## 2016-09-12 NOTE — Telephone Encounter (Signed)
Pt called with c/o's very sore left breast, fever of 101 and body aches.  She is breast feeding.  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  RX for Dicloxicillin 250 mg sent to her pharmacy and encouraged pt to continue to either breast feed or pump during this even though uncomfortable.  Encouraged to alternate cool and warm compressess to breast. Pt will f/u if she does not feel better in 48 hrs.

## 2016-09-12 NOTE — Telephone Encounter (Signed)
Pt stated that she was experiencing body aches, chills and severe pain in her left breast. I made her an appt for today. She later called and asked if she was allowed to breast feed. I spoke with Mariel AloeLora Clark, RN and Janit BernMandy Hutchinson, CMA and they said it was fine for her to continue to breastfeed so I relayed that message to the pt

## 2016-09-23 ENCOUNTER — Encounter: Payer: Self-pay | Admitting: Advanced Practice Midwife

## 2016-09-23 ENCOUNTER — Telehealth: Payer: Self-pay | Admitting: *Deleted

## 2016-09-23 DIAGNOSIS — B372 Candidiasis of skin and nail: Secondary | ICD-10-CM

## 2016-09-23 MED ORDER — FLUCONAZOLE 150 MG PO TABS: ORAL_TABLET | ORAL | 0 refills | Status: DC

## 2016-09-23 MED FILL — FLUCONAZOLE 150 MG TABLET: 150 | 3 days supply | Qty: 2 | Fill #0

## 2016-09-23 NOTE — Telephone Encounter (Signed)
Pt called stating that she had masititis last week and was treated with antibiotic and now has a yeast infection.  RX for Diflucan 150 mg [facility].

## 2016-10-14 ENCOUNTER — Other Ambulatory Visit: Payer: Self-pay | Admitting: Allergy and Immunology

## 2016-10-14 DIAGNOSIS — J309 Allergic rhinitis, unspecified: Secondary | ICD-10-CM

## 2016-10-15 DIAGNOSIS — J301 Allergic rhinitis due to pollen: Secondary | ICD-10-CM | POA: Diagnosis not present

## 2016-10-16 DIAGNOSIS — J3089 Other allergic rhinitis: Secondary | ICD-10-CM | POA: Diagnosis not present

## 2016-10-16 NOTE — Progress Notes (Signed)
Vials to be made 10-16-16  jm 

## 2016-10-17 ENCOUNTER — Ambulatory Visit (INDEPENDENT_AMBULATORY_CARE_PROVIDER_SITE_OTHER): Payer: 59 | Admitting: *Deleted

## 2016-10-17 DIAGNOSIS — J301 Allergic rhinitis due to pollen: Secondary | ICD-10-CM

## 2016-10-21 ENCOUNTER — Ambulatory Visit (INDEPENDENT_AMBULATORY_CARE_PROVIDER_SITE_OTHER): Payer: 59 | Admitting: *Deleted

## 2016-10-21 DIAGNOSIS — J301 Allergic rhinitis due to pollen: Secondary | ICD-10-CM | POA: Diagnosis not present

## 2016-10-22 ENCOUNTER — Ambulatory Visit: Payer: 59

## 2016-10-31 ENCOUNTER — Ambulatory Visit (INDEPENDENT_AMBULATORY_CARE_PROVIDER_SITE_OTHER): Payer: 59

## 2016-10-31 DIAGNOSIS — J301 Allergic rhinitis due to pollen: Secondary | ICD-10-CM

## 2016-11-05 ENCOUNTER — Ambulatory Visit (INDEPENDENT_AMBULATORY_CARE_PROVIDER_SITE_OTHER): Payer: 59 | Admitting: *Deleted

## 2016-11-05 DIAGNOSIS — J301 Allergic rhinitis due to pollen: Secondary | ICD-10-CM | POA: Diagnosis not present

## 2016-11-07 ENCOUNTER — Other Ambulatory Visit: Payer: Self-pay | Admitting: *Deleted

## 2016-11-07 MED ORDER — LEVOCETIRIZINE DIHYDROCHLORIDE 5 MG PO TABS: 5.0000 mg | ORAL_TABLET | Freq: Every evening | ORAL | 3 refills | Status: DC

## 2016-11-07 MED ORDER — BECLOMETHASONE DIPROPIONATE 80 MCG/ACT NA AERS: 2.0000 | INHALATION_SPRAY | Freq: Every day | NASAL | 3 refills | Status: DC

## 2016-11-07 MED ORDER — OLOPATADINE HCL 0.7 % OP SOLN: 1.0000 [drp] | Freq: Every day | OPHTHALMIC | 3 refills | Status: DC | PRN

## 2016-11-07 MED ORDER — EPINEPHRINE 0.3 MG/0.3ML IJ SOAJ: 0.3000 mg | Freq: Once | INTRAMUSCULAR | 1 refills | Status: AC

## 2016-11-12 ENCOUNTER — Ambulatory Visit (INDEPENDENT_AMBULATORY_CARE_PROVIDER_SITE_OTHER): Payer: 59 | Admitting: *Deleted

## 2016-11-12 DIAGNOSIS — J301 Allergic rhinitis due to pollen: Secondary | ICD-10-CM

## 2016-11-19 ENCOUNTER — Ambulatory Visit (INDEPENDENT_AMBULATORY_CARE_PROVIDER_SITE_OTHER): Payer: 59 | Admitting: *Deleted

## 2016-11-19 DIAGNOSIS — J309 Allergic rhinitis, unspecified: Secondary | ICD-10-CM | POA: Diagnosis not present

## 2016-11-25 ENCOUNTER — Ambulatory Visit (INDEPENDENT_AMBULATORY_CARE_PROVIDER_SITE_OTHER): Payer: 59 | Admitting: Allergy and Immunology

## 2016-11-25 ENCOUNTER — Encounter: Payer: Self-pay | Admitting: Allergy and Immunology

## 2016-11-25 VITALS — BP 108/72 | HR 80 | Temp 98.0°F | Resp 14 | Ht 61.5 in | Wt 161.0 lb

## 2016-11-25 DIAGNOSIS — R062 Wheezing: Secondary | ICD-10-CM

## 2016-11-25 DIAGNOSIS — J3089 Other allergic rhinitis: Secondary | ICD-10-CM

## 2016-11-25 DIAGNOSIS — T7800XA Anaphylactic reaction due to unspecified food, initial encounter: Secondary | ICD-10-CM | POA: Insufficient documentation

## 2016-11-25 DIAGNOSIS — T7800XD Anaphylactic reaction due to unspecified food, subsequent encounter: Secondary | ICD-10-CM | POA: Diagnosis not present

## 2016-11-25 DIAGNOSIS — J302 Other seasonal allergic rhinitis: Secondary | ICD-10-CM | POA: Insufficient documentation

## 2016-11-25 HISTORY — DX: Other allergic rhinitis: J30.89

## 2016-11-25 MED ORDER — PROAIR RESPICLICK 108 (90 BASE) MCG/ACT IN AEPB: 1.0000 | INHALATION_SPRAY | Freq: Four times a day (QID) | RESPIRATORY_TRACT | 3 refills | Status: DC | PRN

## 2016-11-25 MED ORDER — BECLOMETHASONE DIPROPIONATE 80 MCG/ACT IN AERS: 2.0000 | INHALATION_SPRAY | Freq: Two times a day (BID) | RESPIRATORY_TRACT | 5 refills | Status: DC

## 2016-11-25 NOTE — Assessment & Plan Note (Signed)
Stable.  Continue appropriate allergen avoidance measures, Qnasl as needed, and levocetirizine as needed.

## 2016-11-25 NOTE — Assessment & Plan Note (Addendum)
   A prescription has been provided for ProAir Respiclick, 1-2 inhalations every 4-6 hours as needed.  During respiratory tract infections or lower respiratory symptom flares, add Qvar 80g 2 inhalations 2 times per day via spacer device until symptoms have returned to baseline.  If symptoms persist or progress, we will add montelukast or start ICS on a daily basis.  Subjective and objective measures of pulmonary function will be followed and the treatment plan will be adjusted accordingly.

## 2016-11-25 NOTE — Progress Notes (Signed)
Follow-up Note  RE: Dawn AlmMildred Hnat MRN: [id number] DOB: May 04, 1986 Date of Office Visit: 11/25/2016  Primary care provider: [place], Rennis HardingPHEDRA ELIZABETH, [place] Referring provider: Murlean HarkHennessee, Phedra Eliza*  History of present illness: Dawn Wu is a 31 y.o. female with allergic rhinitis and food allergies presenting today for sick visit.  She reports that over the weekend she has been experiencing chest tightness and coughing.  She has no nasal or sinus symptom complaints today.  She is not experiencing fevers, chills, or discolored mucus production.   Assessment and plan: Chest tightness/dyspnea  A prescription has been provided for ProAir Respiclick, 1-2 inhalations every 4-6 hours as needed.  During respiratory tract infections or lower respiratory symptom flares, add Qvar 80g 2 inhalations 2 times per day via spacer device until symptoms have returned to baseline.  If symptoms persist or progress, we will add montelukast or start ICS on a daily basis.  Subjective and objective measures of pulmonary function will be followed and the treatment plan will be adjusted accordingly.  Allergic rhinitis Stable.  Continue appropriate allergen avoidance measures, Qnasl as needed, and levocetirizine as needed.  Food allergy  Continue meticulous avoidance of shellfish and have access to epinephrine autoinjector 2 pack in case of accidental ingestion.  Food allergy action plan is in place.   Diagnostics: Spirometry reveals an FVC of 2.59 L and an FEV1 of 2.26 L (78% predicted) with 100 mL post bronchodilator improvement.  Mild restrictive pattern with partial reversibility.  Please see scanned spirometry results for details.    Physical examination: Blood pressure 108/72, pulse 80, temperature 98 F (36.7 C), temperature source Oral, resp. rate 14, height 5' 1.5" (1.562 m), weight 161 lb (73 kg), SpO2 99 %, currently breastfeeding.  General: Alert, interactive, in no acute  distress. HEENT: TMs pearly gray, turbinates moderately edematous without discharge, post-pharynx mildly erythematous. Neck: Supple without lymphadenopathy. Lungs: Clear to auscultation without wheezing, rhonchi or rales. CV: Normal S1, S2 without murmurs. Skin: Warm and dry, without lesions or rashes.  The following portions of the patient's history were reviewed and updated as appropriate: allergies, current medications, past family history, past medical history, past social history, past surgical history and problem list.  Allergies as of 11/25/2016      Reactions   Shellfish Allergy Anaphylaxis   Nuvaring [etonogestrel-ethinyl Estradiol] Hives      Medication List       Accurate as of 11/25/16 11:41 AM. Always use your most recent med list.          beclomethasone 80 MCG/ACT inhaler Commonly known as:  QVAR Inhale 2 puffs into the lungs 2 (two) times daily.   Beclomethasone Dipropionate 80 MCG/ACT Aers Commonly known as:  QNASL Place 2 sprays into both nostrils daily.   cetirizine 10 MG tablet Commonly known as:  ZYRTEC Take 10 mg by mouth daily as needed for allergies.   dicloxacillin 250 MG capsule Commonly known as:  DYNAPEN Take 1 capsule (250 mg total) by mouth 4 (four) times daily.   fluconazole 150 MG tablet Commonly known as:  DIFLUCAN Take 1 PO now and may repeat in 3 days if needed   ibuprofen 600 MG tablet Commonly known as:  ADVIL,MOTRIN Take 1 tablet (600 mg total) by mouth every 6 (six) hours.   levocetirizine 5 MG tablet Commonly known as:  XYZAL Take 1 tablet (5 mg total) by mouth every evening.   Olopatadine HCl 0.7 % Soln Commonly known as:  PAZEO Place 1 drop into  both eyes daily as needed.   PRENATAL VITAMIN PO Take 1 tablet by mouth daily.   PROAIR RESPICLICK 108 (90 Base) MCG/ACT Aepb Generic drug:  Albuterol Sulfate Inhale 1-2 puffs into the lungs 4 (four) times daily as needed (and 15-20 minutes prior to exercise).   ranitidine  150 MG tablet Commonly known as:  ZANTAC Take one each evening to prevent reflux.       Allergies  Allergen Reactions  . Shellfish Allergy Anaphylaxis  . Nuvaring [Etonogestrel-Ethinyl Estradiol] Hives    I appreciate the opportunity to take part in Sadia's care. Please do not hesitate to contact me with questions.  Sincerely,   R. Jorene Guest, MD

## 2016-11-25 NOTE — Patient Instructions (Signed)
Chest tightness/dyspnea  A prescription has been provided for ProAir Respiclick, 1-2 inhalations every 4-6 hours as needed.  During respiratory tract infections or asthma flares, add Qvar 80g 2 inhalations 2 times per day via spacer device until symptoms have returned to baseline.  If symptoms persist or progress, we will add montelukast or start ICS on a daily basis.  Subjective and objective measures of pulmonary function will be followed and the treatment plan will be adjusted accordingly.  Allergic rhinitis Stable.  Continue appropriate allergen avoidance measures, Qnasl as needed, and levocetirizine as needed.   Return in about 4 months (around 03/25/2017), or if symptoms worsen or fail to improve.

## 2016-11-25 NOTE — Assessment & Plan Note (Signed)
   Continue meticulous avoidance of shellfish and have access to epinephrine autoinjector 2 pack in case of accidental ingestion.  Food allergy action plan is in place. 

## 2016-11-27 MED FILL — EPINEPHRINE 0.3 MG AUTO-INJ: 0.3 | 30 days supply | Qty: 2 | Fill #0

## 2016-11-27 MED FILL — raNITIdine HCL 150 MG TABS: 150 | 90 days supply | Qty: 90 | Fill #1

## 2016-11-28 ENCOUNTER — Ambulatory Visit (INDEPENDENT_AMBULATORY_CARE_PROVIDER_SITE_OTHER): Payer: 59 | Admitting: *Deleted

## 2016-11-28 DIAGNOSIS — J309 Allergic rhinitis, unspecified: Secondary | ICD-10-CM

## 2016-12-04 ENCOUNTER — Ambulatory Visit (INDEPENDENT_AMBULATORY_CARE_PROVIDER_SITE_OTHER): Payer: 59 | Admitting: *Deleted

## 2016-12-04 DIAGNOSIS — J309 Allergic rhinitis, unspecified: Secondary | ICD-10-CM

## 2016-12-12 ENCOUNTER — Ambulatory Visit (INDEPENDENT_AMBULATORY_CARE_PROVIDER_SITE_OTHER): Payer: 59 | Admitting: *Deleted

## 2016-12-12 DIAGNOSIS — J309 Allergic rhinitis, unspecified: Secondary | ICD-10-CM

## 2016-12-18 ENCOUNTER — Ambulatory Visit (INDEPENDENT_AMBULATORY_CARE_PROVIDER_SITE_OTHER): Payer: 59 | Admitting: *Deleted

## 2016-12-18 DIAGNOSIS — J309 Allergic rhinitis, unspecified: Secondary | ICD-10-CM

## 2016-12-20 ENCOUNTER — Ambulatory Visit (INDEPENDENT_AMBULATORY_CARE_PROVIDER_SITE_OTHER): Payer: 59

## 2016-12-20 DIAGNOSIS — J309 Allergic rhinitis, unspecified: Secondary | ICD-10-CM | POA: Diagnosis not present

## 2016-12-23 ENCOUNTER — Ambulatory Visit (INDEPENDENT_AMBULATORY_CARE_PROVIDER_SITE_OTHER): Payer: 59 | Admitting: *Deleted

## 2016-12-23 DIAGNOSIS — J309 Allergic rhinitis, unspecified: Secondary | ICD-10-CM

## 2016-12-26 ENCOUNTER — Ambulatory Visit (INDEPENDENT_AMBULATORY_CARE_PROVIDER_SITE_OTHER): Payer: 59 | Admitting: *Deleted

## 2016-12-26 DIAGNOSIS — J309 Allergic rhinitis, unspecified: Secondary | ICD-10-CM

## 2016-12-30 ENCOUNTER — Ambulatory Visit (INDEPENDENT_AMBULATORY_CARE_PROVIDER_SITE_OTHER): Payer: 59 | Admitting: *Deleted

## 2016-12-30 DIAGNOSIS — J309 Allergic rhinitis, unspecified: Secondary | ICD-10-CM

## 2017-01-02 ENCOUNTER — Ambulatory Visit (INDEPENDENT_AMBULATORY_CARE_PROVIDER_SITE_OTHER): Payer: 59 | Admitting: *Deleted

## 2017-01-02 DIAGNOSIS — J309 Allergic rhinitis, unspecified: Secondary | ICD-10-CM

## 2017-01-07 ENCOUNTER — Ambulatory Visit (INDEPENDENT_AMBULATORY_CARE_PROVIDER_SITE_OTHER): Payer: 59 | Admitting: *Deleted

## 2017-01-07 DIAGNOSIS — J309 Allergic rhinitis, unspecified: Secondary | ICD-10-CM | POA: Diagnosis not present

## 2017-01-09 ENCOUNTER — Ambulatory Visit (INDEPENDENT_AMBULATORY_CARE_PROVIDER_SITE_OTHER): Payer: 59 | Admitting: *Deleted

## 2017-01-09 DIAGNOSIS — J309 Allergic rhinitis, unspecified: Secondary | ICD-10-CM

## 2017-01-20 DIAGNOSIS — N912 Amenorrhea, unspecified: Secondary | ICD-10-CM | POA: Diagnosis not present

## 2017-01-20 DIAGNOSIS — E282 Polycystic ovarian syndrome: Secondary | ICD-10-CM | POA: Diagnosis not present

## 2017-01-21 MED FILL — metFORMIN HCL 500 MG TABS: 500 | 90 days supply | Qty: 180 | Fill #0

## 2017-01-21 MED FILL — DROSPIR-ETH ESTRA 3/.02 MG: 3-0.02 | 84 days supply | Qty: 84 | Fill #0

## 2017-01-22 ENCOUNTER — Ambulatory Visit (INDEPENDENT_AMBULATORY_CARE_PROVIDER_SITE_OTHER): Payer: 59 | Admitting: *Deleted

## 2017-01-22 DIAGNOSIS — J309 Allergic rhinitis, unspecified: Secondary | ICD-10-CM | POA: Diagnosis not present

## 2017-01-28 ENCOUNTER — Ambulatory Visit (INDEPENDENT_AMBULATORY_CARE_PROVIDER_SITE_OTHER): Payer: 59 | Admitting: *Deleted

## 2017-01-28 DIAGNOSIS — J309 Allergic rhinitis, unspecified: Secondary | ICD-10-CM | POA: Diagnosis not present

## 2017-02-06 ENCOUNTER — Ambulatory Visit (INDEPENDENT_AMBULATORY_CARE_PROVIDER_SITE_OTHER): Payer: 59 | Admitting: *Deleted

## 2017-02-06 DIAGNOSIS — J309 Allergic rhinitis, unspecified: Secondary | ICD-10-CM | POA: Diagnosis not present

## 2017-02-11 ENCOUNTER — Ambulatory Visit (INDEPENDENT_AMBULATORY_CARE_PROVIDER_SITE_OTHER): Payer: 59 | Admitting: *Deleted

## 2017-02-11 DIAGNOSIS — J309 Allergic rhinitis, unspecified: Secondary | ICD-10-CM | POA: Diagnosis not present

## 2017-02-14 ENCOUNTER — Ambulatory Visit (INDEPENDENT_AMBULATORY_CARE_PROVIDER_SITE_OTHER): Payer: 59

## 2017-02-14 DIAGNOSIS — J309 Allergic rhinitis, unspecified: Secondary | ICD-10-CM | POA: Diagnosis not present

## 2017-02-24 ENCOUNTER — Ambulatory Visit (INDEPENDENT_AMBULATORY_CARE_PROVIDER_SITE_OTHER): Payer: 59 | Admitting: *Deleted

## 2017-02-24 DIAGNOSIS — J3089 Other allergic rhinitis: Secondary | ICD-10-CM

## 2017-02-26 ENCOUNTER — Ambulatory Visit (INDEPENDENT_AMBULATORY_CARE_PROVIDER_SITE_OTHER): Payer: 59 | Admitting: *Deleted

## 2017-02-26 DIAGNOSIS — J3089 Other allergic rhinitis: Secondary | ICD-10-CM

## 2017-03-03 ENCOUNTER — Ambulatory Visit (INDEPENDENT_AMBULATORY_CARE_PROVIDER_SITE_OTHER): Payer: 59 | Admitting: *Deleted

## 2017-03-03 DIAGNOSIS — J3089 Other allergic rhinitis: Secondary | ICD-10-CM

## 2017-03-04 MED FILL — LEVOCETIRIZINE 5 MG TABLET: 5 | 90 days supply | Qty: 90 | Fill #0

## 2017-03-13 ENCOUNTER — Ambulatory Visit (INDEPENDENT_AMBULATORY_CARE_PROVIDER_SITE_OTHER): Payer: 59 | Admitting: *Deleted

## 2017-03-13 DIAGNOSIS — J309 Allergic rhinitis, unspecified: Secondary | ICD-10-CM | POA: Diagnosis not present

## 2017-03-18 ENCOUNTER — Ambulatory Visit (INDEPENDENT_AMBULATORY_CARE_PROVIDER_SITE_OTHER): Payer: 59 | Admitting: *Deleted

## 2017-03-18 DIAGNOSIS — J309 Allergic rhinitis, unspecified: Secondary | ICD-10-CM

## 2017-03-25 ENCOUNTER — Ambulatory Visit (INDEPENDENT_AMBULATORY_CARE_PROVIDER_SITE_OTHER): Payer: 59 | Admitting: *Deleted

## 2017-03-25 DIAGNOSIS — J309 Allergic rhinitis, unspecified: Secondary | ICD-10-CM

## 2017-04-02 ENCOUNTER — Ambulatory Visit (INDEPENDENT_AMBULATORY_CARE_PROVIDER_SITE_OTHER): Payer: 59 | Admitting: *Deleted

## 2017-04-02 DIAGNOSIS — J309 Allergic rhinitis, unspecified: Secondary | ICD-10-CM | POA: Diagnosis not present

## 2017-04-08 ENCOUNTER — Telehealth: Payer: 59 | Admitting: Family

## 2017-04-08 DIAGNOSIS — N390 Urinary tract infection, site not specified: Secondary | ICD-10-CM | POA: Diagnosis not present

## 2017-04-08 DIAGNOSIS — A499 Bacterial infection, unspecified: Secondary | ICD-10-CM

## 2017-04-08 MED ORDER — CIPROFLOXACIN HCL 500 MG PO TABS: 500.0000 mg | ORAL_TABLET | Freq: Two times a day (BID) | ORAL | 0 refills | Status: DC

## 2017-04-08 MED FILL — CIPROFLOXACIN HCL 500 MG TA: 500 | 5 days supply | Qty: 10 | Fill #0

## 2017-04-08 NOTE — Progress Notes (Signed)

## 2017-04-09 ENCOUNTER — Ambulatory Visit (INDEPENDENT_AMBULATORY_CARE_PROVIDER_SITE_OTHER): Payer: 59 | Admitting: *Deleted

## 2017-04-09 DIAGNOSIS — J309 Allergic rhinitis, unspecified: Secondary | ICD-10-CM

## 2017-04-14 MED FILL — ZOVIA 1-35E TABLET: 1-35 | 84 days supply | Qty: 84 | Fill #0

## 2017-04-16 ENCOUNTER — Ambulatory Visit (INDEPENDENT_AMBULATORY_CARE_PROVIDER_SITE_OTHER): Payer: 59 | Admitting: *Deleted

## 2017-04-16 DIAGNOSIS — J309 Allergic rhinitis, unspecified: Secondary | ICD-10-CM | POA: Diagnosis not present

## 2017-04-24 ENCOUNTER — Ambulatory Visit: Payer: 59 | Admitting: Allergy

## 2017-04-24 ENCOUNTER — Ambulatory Visit (INDEPENDENT_AMBULATORY_CARE_PROVIDER_SITE_OTHER): Payer: 59 | Admitting: *Deleted

## 2017-04-24 DIAGNOSIS — J309 Allergic rhinitis, unspecified: Secondary | ICD-10-CM | POA: Diagnosis not present

## 2017-04-29 ENCOUNTER — Ambulatory Visit (INDEPENDENT_AMBULATORY_CARE_PROVIDER_SITE_OTHER): Payer: 59 | Admitting: *Deleted

## 2017-04-29 DIAGNOSIS — J309 Allergic rhinitis, unspecified: Secondary | ICD-10-CM | POA: Diagnosis not present

## 2017-04-29 NOTE — Addendum Note (Signed)
Addended by: Berna BueWHITAKER, CARRIE L on: 04/29/2017 03:17 PM   Modules accepted: Orders

## 2017-05-13 ENCOUNTER — Ambulatory Visit (INDEPENDENT_AMBULATORY_CARE_PROVIDER_SITE_OTHER): Payer: 59 | Admitting: *Deleted

## 2017-05-13 DIAGNOSIS — J309 Allergic rhinitis, unspecified: Secondary | ICD-10-CM | POA: Diagnosis not present

## 2017-05-19 ENCOUNTER — Ambulatory Visit (INDEPENDENT_AMBULATORY_CARE_PROVIDER_SITE_OTHER): Payer: 59 | Admitting: *Deleted

## 2017-05-19 DIAGNOSIS — J309 Allergic rhinitis, unspecified: Secondary | ICD-10-CM

## 2017-05-21 ENCOUNTER — Telehealth: Payer: Self-pay | Admitting: Allergy & Immunology

## 2017-05-21 DIAGNOSIS — T7800XD Anaphylactic reaction due to unspecified food, subsequent encounter: Secondary | ICD-10-CM

## 2017-05-21 NOTE — Telephone Encounter (Signed)
Discussed her food allergies with the patient and she would like to pursue allergy testing to individual shellfish. She would like to introduce individual shellfish, if she is safely able to try this.   Malachi BondsJoel Clements Toro, MD FAAAAI [facility]

## 2017-05-26 ENCOUNTER — Emergency Department (INDEPENDENT_AMBULATORY_CARE_PROVIDER_SITE_OTHER)
Admission: EM | Admit: 2017-05-26 | Discharge: 2017-05-26 | Disposition: A | Discharge status: Home / Self Care | Payer: 59 | Source: Home / Self Care | Attending: Family Medicine | Admitting: Family Medicine

## 2017-05-26 ENCOUNTER — Encounter: Payer: Self-pay | Admitting: Emergency Medicine

## 2017-05-26 DIAGNOSIS — R110 Nausea: Secondary | ICD-10-CM

## 2017-05-26 DIAGNOSIS — R1011 Right upper quadrant pain: Secondary | ICD-10-CM | POA: Diagnosis not present

## 2017-05-26 DIAGNOSIS — R42 Dizziness and giddiness: Secondary | ICD-10-CM | POA: Diagnosis not present

## 2017-05-26 DIAGNOSIS — D649 Anemia, unspecified: Secondary | ICD-10-CM

## 2017-05-26 LAB — POCT CBC W AUTO DIFF (K'VILLE URGENT CARE)

## 2017-05-26 LAB — POCT URINALYSIS DIP (MANUAL ENTRY)
Bilirubin, UA: NEGATIVE
Blood, UA: NEGATIVE
Glucose, UA: NEGATIVE mg/dL
Ketones, POC UA: NEGATIVE mg/dL
Nitrite, UA: NEGATIVE
Protein Ur, POC: NEGATIVE mg/dL
Spec Grav, UA: <=1.005 — AB (ref 1.010–1.025)
Urobilinogen, UA: 0.2 E.U./dL
pH, UA: 6 (ref 5.0–8.0)

## 2017-05-26 LAB — POCT URINE PREGNANCY: Preg Test, Ur: NEGATIVE

## 2017-05-26 MED ORDER — ONDANSETRON HCL 4 MG PO TABS: 4.0000 mg | ORAL_TABLET | Freq: Four times a day (QID) | ORAL | 0 refills | Status: DC

## 2017-05-26 MED ORDER — MECLIZINE HCL 25 MG PO TABS: 25.0000 mg | ORAL_TABLET | Freq: Three times a day (TID) | ORAL | 0 refills | Status: DC | PRN

## 2017-05-26 NOTE — Discharge Instructions (Signed)
° °  You may take the zofran during the day as prescribed for nausea.  Antivert (meclizine) is a medication to help with dizziness and nausea related to vertigo.  This medication can cause drowsiness. Do not operate heavy machinery or drive while taking.

## 2017-05-26 NOTE — ED Provider Notes (Signed)
CSN: [id number]     Arrival date & time 05/26/17  1648 History   First MD Initiated Contact with Patient 05/26/17 1705     Chief Complaint  Patient presents with  . Dizziness   (Consider location/radiation/quality/duration/timing/severity/associated sxs/prior Treatment) HPI Dawn Wu is a 31 y.o. female presenting to UC with c/o intermittent dizziness and nausea that started yesterday and continued throughout the day.  She did feel better this morning but had to leave work early after lunch as symptoms came back.  Her LMP was 05/10/17 but she would like to have a urine pregnancy test done just in case. No hx of vertigo or anemia in the past. No recent head injuries or illness. No congestion. Mild generalized headache. Denies one sided weakness or numbness.    Past Medical History:  Diagnosis Date  . Headache   . Hyperlipidemia   . Infertility management   . PCOS (polycystic ovarian syndrome)   . Plantar fasciitis 01/28/2014   Past Surgical History:  Procedure Laterality Date  . WISDOM TOOTH EXTRACTION     Family History  Problem Relation Age of Onset  . Diabetes Mother   . Hyperlipidemia Mother   . Diabetes Father   . Hyperlipidemia Father   . Cancer Maternal Aunt        uterine/Ovarian   Social History  Substance Use Topics  . Smoking status: Never Smoker  . Smokeless tobacco: Never Used  . Alcohol use No     Comment: occassional   OB History    Gravida Para Term Preterm AB Living   1 1 1     1    SAB TAB Ectopic Multiple Live Births         0 1     Review of Systems  Constitutional: Negative for chills and fever.  HENT: Negative for congestion, ear pain, sore throat, trouble swallowing and voice change.   Respiratory: Negative for cough and shortness of breath.   Cardiovascular: Negative for chest pain and palpitations.  Gastrointestinal: Positive for nausea. Negative for abdominal pain, diarrhea and vomiting.  Genitourinary: Negative for dysuria, frequency  and hematuria.  Musculoskeletal: Negative for arthralgias, back pain and myalgias.  Skin: Negative for rash.  Neurological: Positive for dizziness and headaches. Negative for syncope, facial asymmetry, weakness and light-headedness.    Allergies  Shellfish allergy and Nuvaring [etonogestrel-ethinyl estradiol]  Home Medications   Prior to Admission medications   Medication Sig Start Date End Date Taking? Authorizing Provider  beclomethasone (QVAR) 80 MCG/ACT inhaler Inhale 2 puffs into the lungs 2 (two) times daily. 11/25/16   Bobbitt, Heywood Ilesalph Carter, MD  Beclomethasone Dipropionate (QNASL) 80 MCG/ACT AERS Place 2 sprays into both nostrils daily. 11/07/16   Alfonse SpruceGallagher, Joel Louis, MD  cetirizine (ZYRTEC) 10 MG tablet Take 10 mg by mouth daily as needed for allergies.     [provider]  ibuprofen (ADVIL,MOTRIN) 600 MG tablet Take 1 tablet (600 mg total) by mouth every 6 (six) hours. Patient not taking: Reported on 09/02/2016 07/22/16   Anyanwu, Jethro BastosUgonna A, MD  meclizine (ANTIVERT) 25 MG tablet Take 1 tablet (25 mg total) by mouth 3 (three) times daily as needed for dizziness. 05/26/17   Lurene ShadowPhelps, Jakita Dutkiewicz O, PA-C  Olopatadine HCl (PAZEO) 0.7 % SOLN Place 1 drop into both eyes daily as needed. 11/07/16   Alfonse SpruceGallagher, Joel Louis, MD  ondansetron (ZOFRAN) 4 MG tablet Take 1 tablet (4 mg total) by mouth every 6 (six) hours. 05/26/17   Lurene ShadowPhelps, Alexandre Lightsey O, PA-C  PROAIR RESPICLICK 108 (90 Base) MCG/ACT AEPB Inhale 1-2 puffs into the lungs 4 (four) times daily as needed (and 15-20 minutes prior to exercise). 11/25/16   Bobbitt, Heywood Iles, MD   Meds Ordered and Administered this Visit  Medications - No data to display  BP 120/75 (BP Location: Left Arm)   Pulse 78   Temp 98.3 F (36.8 C) (Oral)   Ht 5\' 2"  (1.575 m)   Wt 170 lb (77.1 kg)   LMP 05/10/2017 (Exact Date)   SpO2 98%   BMI 31.09 kg/m  Orthostatic VS for the past 24 hrs:  BP- Lying Pulse- Lying BP- Sitting Pulse- Sitting BP- Standing at 0  minutes Pulse- Standing at 0 minutes  05/26/17 1707 129/83 76 120/75 78 123/83 78    Physical Exam  Constitutional: She is oriented to person, place, and time. She appears well-developed and well-nourished. No distress.  HENT:  Head: Normocephalic and atraumatic.  Right Ear: Tympanic membrane normal.  Left Ear: Tympanic membrane normal.  Nose: Nose normal.  Mouth/Throat: Uvula is midline, oropharynx is clear and moist and mucous membranes are normal.  Eyes: Pupils are equal, round, and reactive to light. EOM are normal.  Neck: Normal range of motion.  Cardiovascular: Normal rate and regular rhythm.   Pulmonary/Chest: Effort normal and breath sounds normal. No respiratory distress. She has no wheezes. She has no rales.  Musculoskeletal: Normal range of motion.  Neurological: She is alert and oriented to person, place, and time. No cranial nerve deficit.  Skin: Skin is warm and dry. She is not diaphoretic.  Psychiatric: She has a normal mood and affect. Her behavior is normal.  Nursing note and vitals reviewed.   Urgent Care Course     Procedures (including critical care time)  Labs Review Labs Reviewed  POCT URINALYSIS DIP (MANUAL ENTRY) - Abnormal; Notable for the following:       Result Value   Color, UA colorless (*)    Spec Grav, UA <=1.005 (*)    Leukocytes, UA Trace (*)    All other components within normal limits  URINE CULTURE  POCT URINE PREGNANCY  POCT CBC W AUTO DIFF ([facility])    Imaging Review No results found.    MDM   1. Dizziness and giddiness   2. Nausea without vomiting   3. Mild anemia    Pt c/o intermittent dizziness and nausea since yesteday.  UA: trace leukocytes. Culture sent  CBC: mild anemia  No evidence of emergent process taking place at this time. Pt will be notified of results of urine culture.  Encouraged f/u with PCP later this week if not improving. Rx: Meclizine and zofran for during the day    Rolla Plate 05/26/17 1740

## 2017-05-26 NOTE — ED Triage Notes (Signed)
Dizziness and nausea yesterday morning which continued throughout the day. Felt better this morning but got worse after lunch today dizzy and nausea returned.

## 2017-05-27 ENCOUNTER — Emergency Department (HOSPITAL_BASED_OUTPATIENT_CLINIC_OR_DEPARTMENT_OTHER)
Admission: EM | Admit: 2017-05-27 | Discharge: 2017-05-28 | Disposition: A | Discharge status: Home / Self Care | Payer: 59 | Attending: Emergency Medicine | Admitting: Emergency Medicine

## 2017-05-27 ENCOUNTER — Encounter (HOSPITAL_BASED_OUTPATIENT_CLINIC_OR_DEPARTMENT_OTHER): Payer: Self-pay

## 2017-05-27 DIAGNOSIS — R160 Hepatomegaly, not elsewhere classified: Secondary | ICD-10-CM | POA: Diagnosis not present

## 2017-05-27 DIAGNOSIS — R1011 Right upper quadrant pain: Secondary | ICD-10-CM | POA: Diagnosis not present

## 2017-05-27 DIAGNOSIS — Z79899 Other long term (current) drug therapy: Secondary | ICD-10-CM | POA: Insufficient documentation

## 2017-05-27 MED FILL — ONDANSETRON HCL 4 MG TABLET: 4 | 4 days supply | Qty: 12 | Fill #0

## 2017-05-27 MED FILL — MECLIZINE 25 MG TABLET: 25 | 10 days supply | Qty: 30 | Fill #0

## 2017-05-27 NOTE — ED Notes (Signed)
Family at bedside. 

## 2017-05-27 NOTE — ED Triage Notes (Addendum)
C/o dizziness started 7/22-n/v x today-abd pain x 1 week-NAD-steady gait-pt states she was seen for dizziness yesterday-main concern is the cont'd abd and that she started vomiting today

## 2017-05-27 NOTE — ED Notes (Signed)
[facility] and had UA, UP, CBC with dif, and urine culture done.

## 2017-05-28 ENCOUNTER — Emergency Department (HOSPITAL_BASED_OUTPATIENT_CLINIC_OR_DEPARTMENT_OTHER): Payer: 59

## 2017-05-28 DIAGNOSIS — R160 Hepatomegaly, not elsewhere classified: Secondary | ICD-10-CM | POA: Diagnosis not present

## 2017-05-28 DIAGNOSIS — R1011 Right upper quadrant pain: Secondary | ICD-10-CM | POA: Diagnosis not present

## 2017-05-28 DIAGNOSIS — R112 Nausea with vomiting, unspecified: Secondary | ICD-10-CM | POA: Diagnosis not present

## 2017-05-28 DIAGNOSIS — Z79899 Other long term (current) drug therapy: Secondary | ICD-10-CM | POA: Diagnosis not present

## 2017-05-28 DIAGNOSIS — R1110 Vomiting, unspecified: Secondary | ICD-10-CM | POA: Diagnosis not present

## 2017-05-28 LAB — COMPREHENSIVE METABOLIC PANEL
ALBUMIN: 3.8 g/dL (ref 3.5–5.0)
ALT: 45 U/L (ref 14–54)
ANION GAP: 11 (ref 5–15)
AST: 36 U/L (ref 15–41)
Alkaline Phosphatase: 65 U/L (ref 38–126)
BUN: 13 mg/dL (ref 6–20)
CO2: 22 mmol/L (ref 22–32)
Calcium: 8 mg/dL — ABNORMAL LOW (ref 8.9–10.3)
Chloride: 100 mmol/L — ABNORMAL LOW (ref 101–111)
Creatinine, Ser: 0.69 mg/dL (ref 0.44–1.00)
GFR calc Af Amer: >60 mL/min (ref >60)
GFR calc non Af Amer: >60 mL/min (ref >60)
GLUCOSE: 126 mg/dL — AB (ref 65–99)
POTASSIUM: 3.3 mmol/L — AB (ref 3.5–5.1)
SODIUM: 133 mmol/L — AB (ref 135–145)
Total Bilirubin: 0.7 mg/dL (ref 0.3–1.2)
Total Protein: 7.2 g/dL (ref 6.5–8.1)

## 2017-05-28 LAB — CBC WITH DIFFERENTIAL/PLATELET
BASOS ABS: 0 10*3/uL (ref 0.0–0.1)
Basophils Relative: 0 %
Eosinophils Absolute: 0 10*3/uL (ref 0.0–0.7)
Eosinophils Relative: 0 %
HEMATOCRIT: 38.6 % (ref 36.0–46.0)
HEMOGLOBIN: 13.3 g/dL (ref 12.0–15.0)
LYMPHS PCT: 10 %
Lymphs Abs: 1 10*3/uL (ref 0.7–4.0)
MCH: 27.8 pg (ref 26.0–34.0)
MCHC: 34.5 g/dL (ref 30.0–36.0)
MCV: 80.6 fL (ref 78.0–100.0)
MONO ABS: 0.4 10*3/uL (ref 0.1–1.0)
MONOS PCT: 5 %
NEUTROS ABS: 8 10*3/uL — AB (ref 1.7–7.7)
Neutrophils Relative %: 85 %
Platelets: 202 10*3/uL (ref 150–400)
RBC: 4.79 MIL/uL (ref 3.87–5.11)
RDW: 13.1 % (ref 11.5–15.5)
WBC: 9.4 10*3/uL (ref 4.0–10.5)

## 2017-05-28 LAB — LIPASE, BLOOD: Lipase: 32 U/L (ref 11–51)

## 2017-05-28 LAB — URINE CULTURE

## 2017-05-28 MED ORDER — PROMETHAZINE HCL 25 MG PO TABS: 25.0000 mg | ORAL_TABLET | Freq: Four times a day (QID) | ORAL | 0 refills | Status: DC | PRN

## 2017-05-28 MED ORDER — POTASSIUM CHLORIDE CRYS ER 20 MEQ PO TBCR
40.0000 meq | EXTENDED_RELEASE_TABLET | Freq: Once | ORAL | Status: AC
  Administered 2017-05-28: 40 meq via ORAL
  Filled 2017-05-28: qty 2

## 2017-05-28 MED ORDER — FENTANYL CITRATE (PF) 100 MCG/2ML IJ SOLN
100.0000 ug | Freq: Once | INTRAMUSCULAR | Status: AC
  Administered 2017-05-28: 100 ug via INTRAVENOUS
  Filled 2017-05-28: qty 2

## 2017-05-28 MED ORDER — SODIUM CHLORIDE 0.9 % IV BOLUS (SEPSIS)
1000.0000 mL | Freq: Once | INTRAVENOUS | Status: AC
  Administered 2017-05-28: 1000 mL via INTRAVENOUS

## 2017-05-28 MED ORDER — IOPAMIDOL (ISOVUE-300) INJECTION 61%
100.0000 mL | Freq: Once | INTRAVENOUS | Status: AC | PRN
  Administered 2017-05-28: 100 mL via INTRAVENOUS

## 2017-05-28 MED ORDER — PROMETHAZINE HCL 25 MG/ML IJ SOLN
25.0000 mg | Freq: Once | INTRAMUSCULAR | Status: AC
  Administered 2017-05-28: 25 mg via INTRAVENOUS
  Filled 2017-05-28: qty 1

## 2017-05-28 MED ORDER — OXYCODONE HCL 5 MG PO TABS
5.0000 mg | ORAL_TABLET | ORAL | 0 refills | Status: DC | PRN
Start: 2017-05-28 — End: 2018-01-19

## 2017-05-28 MED ORDER — ONDANSETRON 4 MG PO TBDP: 4.0000 mg | ORAL_TABLET | Freq: Three times a day (TID) | ORAL | 0 refills | Status: DC | PRN

## 2017-05-28 MED ORDER — ONDANSETRON HCL 4 MG/2ML IJ SOLN
4.0000 mg | Freq: Once | INTRAMUSCULAR | Status: AC
  Administered 2017-05-28: 4 mg via INTRAVENOUS
  Filled 2017-05-28: qty 2

## 2017-05-28 NOTE — ED Provider Notes (Signed)
[facility] Provider Note   CSN: [id number] Arrival date & time: 05/27/17  2246     History   Chief Complaint Chief Complaint  Patient presents with  . Abdominal Pain    HPI Dawn Wu is a 31 y.o. female.  HPI  31 year old female presents with right upper quadrant abdominal pain. Started about 24 hours ago. She started having dizziness and feeling like the room was spinning 3 days ago. This has significantly improved. Then the abdominal pain started about 24 hours ago. The dizziness is now resolved. Felt like she woke up with a hangover. The abdominal pain seems to come and go and is very severe when it comes. Eating makes it hurt. She is unable to keep fluids down today even water. No fevers but has had chills. Has had some low back pain. Denies any urinary or vaginal symptoms. No prior abdominal surgeries.  Past Medical History:  Diagnosis Date  . Headache   . Hyperlipidemia   . Infertility management   . PCOS (polycystic ovarian syndrome)   . Plantar fasciitis 01/28/2014    Patient Active Problem List   Diagnosis Date Noted  . Chest tightness/dyspnea 11/25/2016  . Allergic rhinitis 11/25/2016  . Food allergy 11/25/2016  . s/p vaginal delivery 01/22/2016  . Atypical squamous cells of undetermined significance (ASCUS) on Papanicolaou smear of cervix 01/22/2016  . Class 1 obesity 04/07/2014    Past Surgical History:  Procedure Laterality Date  . WISDOM TOOTH EXTRACTION      OB History    Gravida Para Term Preterm AB Living   1 1 1     1    SAB TAB Ectopic Multiple Live Births         0 1       Home Medications    Prior to Admission medications   Medication Sig Start Date End Date Taking? Authorizing Provider  beclomethasone (QVAR) 80 MCG/ACT inhaler Inhale 2 puffs into the lungs 2 (two) times daily. 11/25/16   Bobbitt, Heywood Ilesalph Carter, MD  Beclomethasone Dipropionate (QNASL) 80 MCG/ACT AERS Place 2 sprays into both nostrils daily. 11/07/16    Alfonse SpruceGallagher, Joel Louis, MD  cetirizine (ZYRTEC) 10 MG tablet Take 10 mg by mouth daily as needed for allergies.     [provider]  ibuprofen (ADVIL,MOTRIN) 600 MG tablet Take 1 tablet (600 mg total) by mouth every 6 (six) hours. Patient not taking: Reported on 09/02/2016 07/22/16   Anyanwu, Jethro BastosUgonna A, MD  meclizine (ANTIVERT) 25 MG tablet Take 1 tablet (25 mg total) by mouth 3 (three) times daily as needed for dizziness. 05/26/17   Lurene ShadowPhelps, Erin O, PA-C  Olopatadine HCl (PAZEO) 0.7 % SOLN Place 1 drop into both eyes daily as needed. 11/07/16   Alfonse SpruceGallagher, Joel Louis, MD  ondansetron (ZOFRAN ODT) 4 MG disintegrating tablet Take 1 tablet (4 mg total) by mouth every 8 (eight) hours as needed for nausea or vomiting. 05/28/17   Pricilla LovelessGoldston, Karrissa Parchment, MD  ondansetron (ZOFRAN) 4 MG tablet Take 1 tablet (4 mg total) by mouth every 6 (six) hours. 05/26/17   Lurene ShadowPhelps, Erin O, PA-C  oxyCODONE (ROXICODONE) 5 MG immediate release tablet Take 1-2 tablets (5-10 mg total) by mouth every 4 (four) hours as needed for severe pain. 05/28/17   Pricilla LovelessGoldston, Tareka Jhaveri, MD  PROAIR RESPICLICK 108 [phone](90 Base) MCG/ACT AEPB Inhale 1-2 puffs into the lungs 4 (four) times daily as needed (and 15-20 minutes prior to exercise). 11/25/16   Bobbitt, Heywood Ilesalph Carter, MD  promethazine [facility](PHENERGAN)  25 MG tablet Take 1 tablet (25 mg total) by mouth every 6 (six) hours as needed for nausea or vomiting. 05/28/17   Pricilla LovelessGoldston, Zalma Channing, MD    Family History Family History  Problem Relation Age of Onset  . Diabetes Mother   . Hyperlipidemia Mother   . Diabetes Father   . Hyperlipidemia Father   . Cancer Maternal Aunt        uterine/Ovarian    Social History Social History  Substance Use Topics  . Smoking status: Never Smoker  . Smokeless tobacco: Never Used  . Alcohol use Yes     Comment: occ     Allergies   Shellfish allergy and Nuvaring [etonogestrel-ethinyl estradiol]   Review of Systems Review of Systems  Constitutional: Positive for chills.  Negative for fever.  Respiratory: Negative for cough and shortness of breath.   Cardiovascular: Negative for chest pain.  Gastrointestinal: Positive for abdominal pain, nausea and vomiting.  Genitourinary: Negative for dysuria, hematuria, vaginal bleeding and vaginal discharge.  Musculoskeletal: Positive for back pain.  Neurological: Positive for dizziness (now resolved).  All other systems reviewed and are negative.    Physical Exam Updated Vital Signs BP 120/79 (BP Location: Left Arm)   Pulse 94   Temp 99.2 F (37.3 C) (Oral)   Resp 20   Ht 5\' 2"  (1.575 m)   Wt 76.2 kg (168 lb)   LMP 05/10/2017 (Exact Date) Comment: (-[facility].  SpO2 94%   BMI 30.73 kg/m   Physical Exam  Constitutional: She is oriented to person, place, and time. She appears well-developed and well-nourished.  HENT:  Head: Normocephalic and atraumatic.  Right Ear: External ear normal.  Left Ear: External ear normal.  Nose: Nose normal.  Eyes: Pupils are equal, round, and reactive to light. EOM are normal. Right eye exhibits no discharge. Left eye exhibits no discharge.  Neck: Neck supple.  Cardiovascular: Normal rate, regular rhythm and normal heart sounds.   Pulmonary/Chest: Effort normal and breath sounds normal.  Abdominal: Soft. She exhibits no distension. There is tenderness (mild) in the right upper quadrant. There is no CVA tenderness.  Neurological: She is alert and oriented to person, place, and time.  CN 3-12 grossly intact. 5/5 strength in all 4 extremities. Grossly normal sensation. Normal finger to nose.   Skin: Skin is warm and dry.  Nursing note and vitals reviewed.    ED Treatments / Results  Labs (all labs ordered are listed, but only abnormal results are displayed) Labs Reviewed  COMPREHENSIVE METABOLIC PANEL - Abnormal; Notable for the following:       Result Value   Sodium 133 (*)    Potassium 3.3 (*)    Chloride 100 (*)    Glucose, Bld 126 (*)     Calcium 8.0 (*)    All other components within normal limits  CBC WITH DIFFERENTIAL/PLATELET - Abnormal; Notable for the following:    Neutro Abs 8.0 (*)    All other components within normal limits  LIPASE, BLOOD  PREGNANCY, URINE    EKG  EKG Interpretation None       Radiology Ct Abdomen Pelvis W Contrast  Result Date: 05/28/2017 CLINICAL DATA:  Right upper quadrant pain.  Nausea and vomiting. EXAM: CT ABDOMEN AND PELVIS WITH CONTRAST TECHNIQUE: Multidetector CT imaging of the abdomen and pelvis was performed using the standard protocol following bolus administration of intravenous contrast. CONTRAST:  100mL ISOVUE-300 IOPAMIDOL (ISOVUE-300) INJECTION 61% COMPARISON:  None. FINDINGS: Lower  chest: The lung bases are clear. Hepatobiliary: The liver is enlarged spanning 24 cm cranial caudal. There is marked decreased density consistent with steatosis. Probable focal fatty sparing adjacent to the gallbladder fossa false form ligament. Gallbladder physiologically distended, no calcified stone. No biliary dilatation. Pancreas: No ductal dilatation or inflammation. Spleen: Normal in size without focal abnormality. Adrenals/Urinary Tract: Normal adrenal glands. No hydronephrosis or perinephric edema. No focal renal abnormality. Ureters are decompressed. Urinary bladder is nondistended and not well evaluated Stomach/Bowel: The stomach decompressed. No bowel obstruction, wall thickening or inflammation. Normal appendix. Moderate colonic stool burden. No colonic wall thickening. Vascular/Lymphatic: No significant vascular findings are present. No enlarged abdominal or pelvic lymph nodes. Multiple small ileocolic nodes are not enlarged by size criteria. Reproductive: Uterus and bilateral adnexa are unremarkable. The ovaries are symmetric in size. Other: No free air, free fluid, or intra-abdominal fluid collection. Musculoskeletal: There are no acute or suspicious osseous abnormalities. IMPRESSION: 1.  Hepatomegaly with hepatic steatosis and focal fatty sparing adjacent to the gallbladder fossa and falciform ligament. Hepatomegaly and hepatic capsular stretching may be cause of right upper quadrant pain. 2. Normal CT appearance of gallbladder. No pericholecystic inflammation. Electronically Signed   By: Rubye Oaks M.D.   On: 05/28/2017 01:59    Procedures Procedures (including critical care time)  Medications Ordered in ED Medications  sodium chloride 0.9 % bolus 1,000 mL (1,000 mLs Intravenous New Bag/Given 05/28/17 0051)  fentaNYL (SUBLIMAZE) injection 100 mcg (100 mcg Intravenous Given 05/28/17 0056)  ondansetron (ZOFRAN) injection 4 mg (4 mg Intravenous Given 05/28/17 0059)  iopamidol (ISOVUE-300) 61 % injection 100 mL (100 mLs Intravenous Contrast Given 05/28/17 0128)  promethazine (PHENERGAN) injection 25 mg (25 mg Intravenous Given 05/28/17 0154)  potassium chloride SA (K-DUR,KLOR-CON) CR tablet 40 mEq (40 mEq Oral Given 05/28/17 0153)     Initial Impression / Assessment and Plan / ED Course  I have reviewed the triage vital signs and the nursing notes.  Pertinent labs & imaging results that were available during my care of the patient were reviewed by me and considered in my medical decision making (see chart for details).     There is no ultrasound at this facility at this time, so CT was obtained to help rule out gallbladder pathology given the right upper quadrant pain. CT shows hepatomegaly with hepatic steatosis. Probably this is causing her pain with some capsular swelling/pain. LFTs unremarkable. Pain and nausea controlled. Given supplemental K, likely mildly hypokalemic from vomiting. Given improvement and otherwise benign findings, will d/c home with pain/nausea control, f/u with GI. Discussed return precautions. For now, avoid tylenol/ETOH.  Final Clinical Impressions(s) / ED Diagnoses   Final diagnoses:  Right upper quadrant abdominal pain  Hepatomegaly    New  Prescriptions New Prescriptions   ONDANSETRON (ZOFRAN ODT) 4 MG DISINTEGRATING TABLET    Take 1 tablet (4 mg total) by mouth every 8 (eight) hours as needed for nausea or vomiting.   OXYCODONE (ROXICODONE) 5 MG IMMEDIATE RELEASE TABLET    Take 1-2 tablets (5-10 mg total) by mouth every 4 (four) hours as needed for severe pain.   PROMETHAZINE (PHENERGAN) 25 MG TABLET    Take 1 tablet (25 mg total) by mouth every 6 (six) hours as needed for nausea or vomiting.     Pricilla Loveless, MD 05/28/17 [phone]

## 2017-05-28 NOTE — ED Notes (Signed)
Family at bedside. 

## 2017-05-28 NOTE — Discharge Instructions (Signed)
You were CT scan shows that your liver is enlarged. Avoid medicines that could damage your liver such as Tylenol and alcohol. Follow-up with the gastroenterology specialist listed. If you develop recurrent or worsening pain, vomiting, or any other new or concerning symptoms, return to the ER for evaluation.

## 2017-05-28 NOTE — ED Notes (Signed)
Pt throwing up; MD notified.

## 2017-05-28 NOTE — ED Notes (Signed)
Pt to CT in bed.

## 2017-05-29 ENCOUNTER — Ambulatory Visit (INDEPENDENT_AMBULATORY_CARE_PROVIDER_SITE_OTHER): Payer: 59 | Admitting: Internal Medicine

## 2017-05-29 ENCOUNTER — Encounter: Payer: Self-pay | Admitting: Internal Medicine

## 2017-05-29 VITALS — BP 108/70 | HR 84 | Ht 61.12 in | Wt 167.2 lb

## 2017-05-29 DIAGNOSIS — R1011 Right upper quadrant pain: Secondary | ICD-10-CM

## 2017-05-29 DIAGNOSIS — R935 Abnormal findings on diagnostic imaging of other abdominal regions, including retroperitoneum: Secondary | ICD-10-CM | POA: Diagnosis not present

## 2017-05-29 DIAGNOSIS — K760 Fatty (change of) liver, not elsewhere classified: Secondary | ICD-10-CM | POA: Diagnosis not present

## 2017-05-29 MED ORDER — OMEPRAZOLE 40 MG PO CPDR: 40.0000 mg | DELAYED_RELEASE_CAPSULE | Freq: Every day | ORAL | 6 refills | Status: DC

## 2017-05-29 MED ORDER — METRONIDAZOLE 250 MG PO TABS: 250.0000 mg | ORAL_TABLET | Freq: Three times a day (TID) | ORAL | 0 refills | Status: DC

## 2017-05-29 MED FILL — OMEPRAZOLE DR 40 MG CAPSULE: 40 | 30 days supply | Qty: 30 | Fill #0

## 2017-05-29 NOTE — Patient Instructions (Addendum)
We have sent the following medications to your pharmacy for you to pick up at your convenience:  Omeprazole  You [facility] on 06/02/2017 at 9:30am. Please arrive 15 minutes prior to your appointment for registration. Make certain not to have anything to eat or drink after midnight prior to your appointment. Should you need to reschedule your appointment, please contact radiology at [phone]. This test typically takes about 30 minutes to perform.   You have been scheduled for an endoscopy. Please follow written instructions given to you at your visit today. If you use inhalers (even only as needed), please bring them with you on the day of your procedure. Your physician has requested that you go to www.startemmi.com and enter the access code given to you at your visit today. This web site gives a general overview about your procedure. However, you should still follow specific instructions given to you by our office regarding your preparation for the procedure.

## 2017-05-29 NOTE — Progress Notes (Signed)
HISTORY OF PRESENT ILLNESS:  Dawn Wu is a 31 y.o. 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 Pricilla LovelessScott Goldston with a chief complaint of worsening right upper quadrant pain and abnormal hepatic imaging. Patient reports problems with intermittent rectal quadrant discomfort over the past 2 weeks. Worsened to the point of emergency room evaluation 05/27/2017. Multiple laboratories were obtained. The liver test and lipase were normal. Mild hypokalemia treated. Normal CBC with differential. Negative pregnancy testing. CT scan of the abdomen and pelvis with contrast was performed which revealed hepatomegaly and fatty liver. No other abnormalities. Ultrasound not obtained secondary to lack of availability. GI referral made. Patient tells me that her discomfort has been constant over the past 3 days. She describes it as sharp and occasionally worse with certain movements or deep breathing. She has been using ibuprofen and she was told not to use Tylenol. She does describe on occasion that postprandially she will experience of burning sensation with worsening of the discomfort. She has had nausea with vomiting occasionally. Last episode 3 days ago. She denies fevers, change in urine color, or melena. She does have occasional heartburn.  REVIEW OF SYSTEMS:  All non-GI ROS negative except for allergy, back pain, headaches  Past Medical History:  Diagnosis Date  . Headache   . Hyperlipidemia   . Infertility management   . PCOS (polycystic ovarian syndrome)   . Plantar fasciitis 01/28/2014    Past Surgical History:  Procedure Laterality Date  . WISDOM TOOTH EXTRACTION      Social History Dawn Wu  reports that she has never smoked. She has never used smokeless tobacco. She reports that she drinks alcohol. She reports that she does not use drugs.  family history includes Cancer in her maternal aunt; Diabetes in her father and mother; Hyperlipidemia in her father  and mother.  Allergies  Allergen Reactions  . Shellfish Allergy Anaphylaxis  . Nuvaring [Etonogestrel-Ethinyl Estradiol] Hives       PHYSICAL EXAMINATION: Vital signs: BP 108/70 (BP Location: Left Arm, Patient Position: Sitting, Cuff Size: Normal)   Pulse 84   Ht 5' 1.12" (1.552 m) Comment: height measured without shoes  Wt 167 lb 4 oz (75.9 kg)   LMP 05/10/2017 (Exact Date) Comment: (-) [facility]//[facility].  BMI 31.48 kg/m   Constitutional: generally well-appearing, no acute distress Psychiatric: alert and oriented x3, cooperative Eyes: extraocular movements intact, anicteric, conjunctiva pink Mouth: oral pharynx moist, no lesions Neck: supple no lymphadenopathy Cardiovascular: heart regular rate and rhythm, no murmur Lungs: clear to auscultation bilaterally Abdomen: soft, Mild tenderness in the right upper quadrant with palpation, nondistended, no obvious ascites, no peritoneal signs, normal bowel sounds, liver palpated below the right costal margin Rectal:Omitted Extremities: no clubbing cyanosis or lower extremity edema bilaterally Skin: no lesions on visible extremities Neuro: No focal deficits. Cranial nerves intact. No asterixis.    ASSESSMENT:  #1. Chronic worsening right upper quadrant pain. Rule out gallbladder. Rule out ulcer disease. Rule out GERD equivalent. Rule out musculoskeletal. Rule out hepatomegaly #2. Fatty liver on CT. Normal liver tests. No clinical evidence for liver disease #3. Obesity #4. GERD symptoms at times   PLAN:  #1. Schedule abdominal ultrasound rule out gallstones. This is more sensitive than CT #2. EGD to rule out ulcer disease.The nature of the procedure, as well as the risks, benefits, and alternatives were carefully and thoroughly reviewed with the patient. Ample time for discussion and questions allowed. The  patient understood, was satisfied, and agreed to proceed. #3. Initiate omeprazole 40 mg daily for possible acid peptic  disorders #4. Discussion of fatty liver disease with the recommendation of exercise and weight loss as this may lead to cirrhosis long-term #5. Further recommendations as indicated after the above completed  A copy of this consultation note has been sent to Dr. Criss AlvineGoldston

## 2017-05-30 ENCOUNTER — Ambulatory Visit (HOSPITAL_COMMUNITY)
Admission: RE | Admit: 2017-05-30 | Discharge: 2017-05-30 | Disposition: A | Discharge status: Home / Self Care | Payer: 59 | Source: Ambulatory Visit | Attending: Internal Medicine | Admitting: Internal Medicine

## 2017-05-30 DIAGNOSIS — K760 Fatty (change of) liver, not elsewhere classified: Secondary | ICD-10-CM | POA: Diagnosis not present

## 2017-05-30 DIAGNOSIS — R160 Hepatomegaly, not elsewhere classified: Secondary | ICD-10-CM | POA: Diagnosis not present

## 2017-05-30 DIAGNOSIS — R1011 Right upper quadrant pain: Secondary | ICD-10-CM

## 2017-06-02 ENCOUNTER — Telehealth: Payer: Self-pay | Admitting: Internal Medicine

## 2017-06-02 ENCOUNTER — Ambulatory Visit (HOSPITAL_COMMUNITY): Payer: 59

## 2017-06-02 NOTE — Telephone Encounter (Signed)
Spoke with pt and she is aware.

## 2017-06-02 NOTE — Telephone Encounter (Signed)
Tylenol in recommended dosages is okay

## 2017-06-02 NOTE — Telephone Encounter (Signed)
Pt states she is still having RUQ pain. Pt reports the pain gets worse when she eats. She is scheduled for an EGD and knows to keep that appt. Pt states she has been taking motrin 600mg  and it has not been helping much. Discussed with her that the EGD is being done to see if she has any ulcers and that the motrin could make the pain worse if she had ulcers. Pt states the ER told her not to take tylenol due to fatty liver. Pt wants to know what Dr. Marina GoodellPerry suggests she take for this discomfort and if Tylenol is ok for her to take.Marland Kitchen. Please advise.

## 2017-06-03 ENCOUNTER — Other Ambulatory Visit (HOSPITAL_COMMUNITY): Payer: Self-pay | Admitting: Gastroenterology

## 2017-06-03 DIAGNOSIS — R1011 Right upper quadrant pain: Secondary | ICD-10-CM

## 2017-06-03 DIAGNOSIS — R112 Nausea with vomiting, unspecified: Secondary | ICD-10-CM | POA: Diagnosis not present

## 2017-06-03 DIAGNOSIS — K219 Gastro-esophageal reflux disease without esophagitis: Secondary | ICD-10-CM | POA: Diagnosis not present

## 2017-06-04 DIAGNOSIS — R160 Hepatomegaly, not elsewhere classified: Secondary | ICD-10-CM | POA: Insufficient documentation

## 2017-06-04 DIAGNOSIS — K760 Fatty (change of) liver, not elsewhere classified: Secondary | ICD-10-CM | POA: Insufficient documentation

## 2017-06-06 ENCOUNTER — Encounter (HOSPITAL_COMMUNITY)
Admission: RE | Admit: 2017-06-06 | Discharge: 2017-06-06 | Disposition: A | Discharge status: Home / Self Care | Payer: 59 | Source: Ambulatory Visit | Attending: Gastroenterology | Admitting: Gastroenterology

## 2017-06-06 DIAGNOSIS — R112 Nausea with vomiting, unspecified: Secondary | ICD-10-CM | POA: Diagnosis not present

## 2017-06-06 DIAGNOSIS — R1011 Right upper quadrant pain: Secondary | ICD-10-CM | POA: Diagnosis not present

## 2017-06-06 MED ORDER — TECHNETIUM TC 99M MEBROFENIN IV KIT
5.0000 | PACK | Freq: Once | INTRAVENOUS | Status: AC | PRN
  Administered 2017-06-06: 5 via INTRAVENOUS

## 2017-06-09 ENCOUNTER — Ambulatory Visit (INDEPENDENT_AMBULATORY_CARE_PROVIDER_SITE_OTHER): Payer: 59 | Admitting: *Deleted

## 2017-06-09 DIAGNOSIS — J309 Allergic rhinitis, unspecified: Secondary | ICD-10-CM | POA: Diagnosis not present

## 2017-06-11 ENCOUNTER — Ambulatory Visit (HOSPITAL_COMMUNITY): Payer: 59

## 2017-06-13 ENCOUNTER — Ambulatory Visit (HOSPITAL_COMMUNITY): Payer: 59

## 2017-06-16 ENCOUNTER — Ambulatory Visit: Payer: Self-pay | Admitting: Surgery

## 2017-06-16 DIAGNOSIS — K828 Other specified diseases of gallbladder: Secondary | ICD-10-CM | POA: Diagnosis not present

## 2017-06-16 NOTE — H&P (Signed)
Dawn Wu 06/16/2017 2:14 PM Location: [place] Office Patient #: [id number] DOB: 01/02/1986 Single / Language: Dawn Wu / Race: Refused to Report/Unreported Female  History of Present Illness (Dawn Wu A. Fredricka Bonine MD; 06/16/2017 2:43 PM) Patient words: this is an otherwise healthy 31 year old woman is referred for biliary dyskinesia. She began having symptoms about a month ago with vague upper abdominal pain related to meals which she thought was indigestion. About 3 weeks ago the pain became more focally specific to the right upper quadrant and became associated with associated with nausea and specifically triggered by meals that are fatty in nature. She underwent a CT scan on July 25 that showed hepatomegaly with hepatic stay ptosis and focal fatty sparing adjacent to the gallbladder fossa and Dawn Wu but no other abnormalities. subsequently she got her upper quadrant ultrasound that showed no gallstones or wall thickening, common bile duct 4 mm. similar findings of enlarged fatty liver but otherwise normal values. she underwent a HIDA scan about 10 days ago that did reveal an abnormally low ejection fraction of 10% of the gallbladder and abdominal cramping with the oral ensure consumption. her labwork in July which included a CBC and CMP were within normal limits. she's never had abdominal surgery. She is a 54-month-old baby. 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  The patient is a 31 year old female.   Allergies (Armen Sherrine Maples, CMA; 06/16/2017 2:18 PM) SHELLFISH Anaphylaxis. NuvaRing *CONTRACEPTIVES* Hives.  Medication History Juanita Craver, CMA; 06/16/2017 2:19 PM) Blain Pais 1/35E (28) (1-35MG -MCG Tablet, Oral) Active. Omeprazole (40MG  Capsule DR, Oral) Active. ZyrTEC (10MG  Tablet, Oral) Active. Ondansetron HCl (4MG  Tablet, Oral) Active. Ibuprofen (600MG  Tablet, Oral) Active. Medications Reconciled     Review of Systems ([name]  A. Fredricka Bonine MD; 06/16/2017 2:44 PM) All other systems negative  Vitals (Armen Glenn CMA; 06/16/2017 2:17 PM) 06/16/2017 2:16 PM Weight: 166 lb Height: 62in Body Surface Area: 1.77 m Body Mass Index: 30.36 kg/m  Temp.: 97.29F  Pulse: 91 (Regular)  P.OX: 97% (Room air) BP: 100/70 (Sitting, Left Arm, Standard)      Physical Exam ([name] A. Fredricka Bonine MD; 06/16/2017 2:45 PM)  General Note: alert, well-appearing  Integumentary Note: skin is warm and dry  Eye Note: no scleral icterus. Pupils equally round and reactive.  Chest and Lung Exam Note: labored respirations. Symmetrical air entry.  Cardiovascular Note: regular rate and rhythm. No pedal edema.  Abdomen Note: soft, nontender except mildly in the right subcostal margin midclavicular line. No distention, no mass or organomegaly. No rebound or guarding  Neurologic Note: grossly intact, normal gait  Neuropsychiatric Note: normal mood and affect, appropriate insight  Musculoskeletal Note: strength symmetrical throughout, no deformity    Assessment & Plan (Jerrad Mendibles A. Anastasya Jewell MD; 06/16/2017 2:46 PM)  BILIARY DYSKINESIA (K82.8) Story: Her symptoms are consistent with biliary etiology in the setting of abnormal HIDA. I offered her laparoscopic cholecystectomy. We discussed the surgery including risks of bleeding, infection, pain, scarring, intra-abdominal injury specifically to the common bile duct and sequelae, conversion to open surgery. We also discussed the very small possibility that her symptoms are not resolved after laparoscopic cholecystectomy in which further workup perhaps with endoscopy may to be pursued. We discussed the finding of fatty liver and the implications of that. All of her questions were answered. We will get her scheduled in the coming weeks. I recommend that she stick to a low-fat diet in the interim between now and surgery to minimize her risk of  recurrent attacks, and we discussed signs  and symptoms that should prompt her to visit the emergency room.

## 2017-06-17 ENCOUNTER — Ambulatory Visit (INDEPENDENT_AMBULATORY_CARE_PROVIDER_SITE_OTHER): Payer: 59

## 2017-06-17 DIAGNOSIS — J309 Allergic rhinitis, unspecified: Secondary | ICD-10-CM

## 2017-06-19 ENCOUNTER — Encounter: Payer: 59 | Admitting: Internal Medicine

## 2017-06-19 NOTE — Progress Notes (Signed)
05-28-17 (EPIC) CBC w/Diff, CMP

## 2017-06-19 NOTE — Patient Instructions (Addendum)
Bennye AlmMildred Buckingham  06/19/2017   Your procedure is scheduled on: 06-24-17   Report to [facility] Main  Entrance Take [facility]  [facility] to 3rd floor to  Short Stay Center at 7:16 AM.   Call this number if you have problems the morning of surgery [phone]    Remember: ONLY 1 PERSON MAY GO WITH YOU TO SHORT STAY TO GET  READY MORNING OF YOUR SURGERY.  Do not eat food or drink liquids :After Midnight.     Take these medicines the morning of surgery with A SIP OF WATER: Omeprazole (Prilosec)                                You may not have any metal on your body including hair pins and              piercings  Do not wear jewelry, make-up, lotions, powders or perfumes, deodorant             Do not wear nail polish.  Do not shave  48 hours prior to surgery.                Do not bring valuables to the hospital. [facility] IS NOT             RESPONSIBLE   FOR VALUABLES.  Contacts, dentures or bridgework may not be worn into surgery.      Patients discharged the day of surgery will not be allowed to drive home.  Name and phone number of your driver: Ledell NossJorge Aguilara [phone]               Please read over the following fact sheets you were given: _____________________________________________________________________             [facility] Before surgery, you can play an important role.  Because skin is not sterile, your skin needs to be as free of germs as possible.  You can reduce the number of germs on your skin by washing with CHG (chlorahexidine gluconate) soap before surgery.  CHG is an antiseptic cleaner which kills germs and bonds with the skin to continue killing germs even after washing. Please DO NOT use if you have an allergy to CHG or antibacterial soaps.  If your skin becomes reddened/irritated stop using the CHG and inform your nurse when you arrive at Short Stay. Do not shave (including legs and underarms) for at least 48  hours prior to the first CHG shower.  You may shave your face/neck. Please follow these instructions carefully:  1.  Shower with CHG Soap the night before surgery and the  morning of Surgery.  2.  If you choose to wash your hair, wash your hair first as usual with your  normal  shampoo.  3.  After you shampoo, rinse your hair and body thoroughly to remove the  shampoo.                           4.  Use CHG as you would any other liquid soap.  You can apply chg directly  to the skin and wash                       Gently with a scrungie or clean  washcloth.  5.  Apply the CHG Soap to your body ONLY FROM THE NECK DOWN.   Do not use on face/ open                           Wound or open sores. Avoid contact with eyes, ears mouth and genitals (private parts).                       Wash face,  Genitals (private parts) with your normal soap.             6.  Wash thoroughly, paying special attention to the area where your surgery  will be performed.  7.  Thoroughly rinse your body with warm water from the neck down.  8.  DO NOT shower/wash with your normal soap after using and rinsing off  the CHG Soap.                9.  Pat yourself dry with a clean towel.            10.  Wear clean pajamas.            11.  Place clean sheets on your bed the night of your first shower and do not  sleep with pets. Day of Surgery : Do not apply any lotions/deodorants the morning of surgery.  Please wear clean clothes to the hospital/surgery center.  FAILURE TO FOLLOW THESE INSTRUCTIONS MAY RESULT IN THE CANCELLATION OF YOUR SURGERY PATIENT SIGNATURE_________________________________  NURSE SIGNATURE__________________________________  ________________________________________________________________________

## 2017-06-20 ENCOUNTER — Encounter (HOSPITAL_COMMUNITY)
Admission: RE | Admit: 2017-06-20 | Discharge: 2017-06-20 | Disposition: A | Discharge status: Home / Self Care | Payer: 59 | Source: Ambulatory Visit | Attending: Surgery | Admitting: Surgery

## 2017-06-20 ENCOUNTER — Encounter (HOSPITAL_COMMUNITY): Payer: Self-pay

## 2017-06-20 DIAGNOSIS — Z01812 Encounter for preprocedural laboratory examination: Secondary | ICD-10-CM | POA: Insufficient documentation

## 2017-06-20 DIAGNOSIS — K828 Other specified diseases of gallbladder: Secondary | ICD-10-CM | POA: Diagnosis not present

## 2017-06-20 LAB — PREGNANCY, URINE: Preg Test, Ur: NEGATIVE

## 2017-06-23 ENCOUNTER — Ambulatory Visit (INDEPENDENT_AMBULATORY_CARE_PROVIDER_SITE_OTHER): Payer: 59 | Admitting: *Deleted

## 2017-06-23 DIAGNOSIS — J309 Allergic rhinitis, unspecified: Secondary | ICD-10-CM | POA: Diagnosis not present

## 2017-06-24 ENCOUNTER — Ambulatory Visit (HOSPITAL_COMMUNITY): Payer: 59 | Admitting: Registered Nurse

## 2017-06-24 ENCOUNTER — Encounter (HOSPITAL_COMMUNITY): Payer: Self-pay | Admitting: *Deleted

## 2017-06-24 ENCOUNTER — Ambulatory Visit (HOSPITAL_COMMUNITY)
Admission: RE | Admit: 2017-06-24 | Discharge: 2017-06-24 | Disposition: A | Discharge status: Home / Self Care | Payer: 59 | Source: Ambulatory Visit | Attending: Surgery | Admitting: Surgery

## 2017-06-24 ENCOUNTER — Encounter (HOSPITAL_COMMUNITY)
Admission: RE | Disposition: A | Discharge status: Home / Self Care | Payer: Self-pay | Source: Ambulatory Visit | Attending: Surgery

## 2017-06-24 DIAGNOSIS — K811 Chronic cholecystitis: Secondary | ICD-10-CM | POA: Insufficient documentation

## 2017-06-24 DIAGNOSIS — R0600 Dyspnea, unspecified: Secondary | ICD-10-CM | POA: Diagnosis not present

## 2017-06-24 DIAGNOSIS — K219 Gastro-esophageal reflux disease without esophagitis: Secondary | ICD-10-CM | POA: Insufficient documentation

## 2017-06-24 DIAGNOSIS — J309 Allergic rhinitis, unspecified: Secondary | ICD-10-CM | POA: Diagnosis not present

## 2017-06-24 DIAGNOSIS — K828 Other specified diseases of gallbladder: Secondary | ICD-10-CM | POA: Insufficient documentation

## 2017-06-24 DIAGNOSIS — Z79899 Other long term (current) drug therapy: Secondary | ICD-10-CM | POA: Diagnosis not present

## 2017-06-24 HISTORY — PX: CHOLECYSTECTOMY: SHX55

## 2017-06-24 SURGERY — LAPAROSCOPIC CHOLECYSTECTOMY
Anesthesia: General | Site: Abdomen

## 2017-06-24 MED ORDER — 0.9 % SODIUM CHLORIDE (POUR BTL) OPTIME
TOPICAL | Status: DC | PRN
  Administered 2017-06-24: 1000 mL

## 2017-06-24 MED ORDER — CEFAZOLIN SODIUM-DEXTROSE 2-4 GM/100ML-% IV SOLN
2.0000 g | INTRAVENOUS | Status: AC
  Administered 2017-06-24: 2 g via INTRAVENOUS
  Filled 2017-06-24: qty 100

## 2017-06-24 MED ORDER — LIDOCAINE 2% (20 MG/ML) 5 ML SYRINGE
INTRAMUSCULAR | Status: AC
  Filled 2017-06-24: qty 5

## 2017-06-24 MED ORDER — PROPOFOL 10 MG/ML IV BOLUS
INTRAVENOUS | Status: AC
  Filled 2017-06-24: qty 20

## 2017-06-24 MED ORDER — LIDOCAINE 2% (20 MG/ML) 5 ML SYRINGE
INTRAMUSCULAR | Status: DC | PRN
  Administered 2017-06-24: 100 mg via INTRAVENOUS

## 2017-06-24 MED ORDER — ACETAMINOPHEN 500 MG PO TABS
1000.0000 mg | ORAL_TABLET | ORAL | Status: AC
  Administered 2017-06-24: 1000 mg via ORAL
  Filled 2017-06-24: qty 2

## 2017-06-24 MED ORDER — MIDAZOLAM HCL 2 MG/2ML IJ SOLN
INTRAMUSCULAR | Status: AC
  Filled 2017-06-24: qty 2

## 2017-06-24 MED ORDER — CHLORHEXIDINE GLUCONATE 4 % EX LIQD: 60.0000 mL | Freq: Once | CUTANEOUS | Status: DC

## 2017-06-24 MED ORDER — SODIUM CHLORIDE 0.9% FLUSH: 3.0000 mL | Freq: Two times a day (BID) | INTRAVENOUS | Status: DC

## 2017-06-24 MED ORDER — DEXAMETHASONE SODIUM PHOSPHATE 10 MG/ML IJ SOLN
INTRAMUSCULAR | Status: DC | PRN
  Administered 2017-06-24: 10 mg via INTRAVENOUS

## 2017-06-24 MED ORDER — BUPIVACAINE-EPINEPHRINE (PF) 0.25% -1:200000 IJ SOLN
INTRAMUSCULAR | Status: AC
  Filled 2017-06-24: qty 30

## 2017-06-24 MED ORDER — CHLORHEXIDINE GLUCONATE 4 % EX LIQD
60.0000 mL | Freq: Once | CUTANEOUS | Status: DC
Start: 2017-06-25 — End: 2017-06-24

## 2017-06-24 MED ORDER — ONDANSETRON HCL 4 MG/2ML IJ SOLN
INTRAMUSCULAR | Status: DC | PRN
  Administered 2017-06-24: 4 mg via INTRAVENOUS

## 2017-06-24 MED ORDER — LACTATED RINGERS IV SOLN
INTRAVENOUS | Status: DC
  Administered 2017-06-24: 08:00:00 via INTRAVENOUS

## 2017-06-24 MED ORDER — GABAPENTIN 300 MG PO CAPS
300.0000 mg | ORAL_CAPSULE | ORAL | Status: AC
  Administered 2017-06-24: 300 mg via ORAL
  Filled 2017-06-24: qty 1

## 2017-06-24 MED ORDER — MIDAZOLAM HCL 5 MG/5ML IJ SOLN
INTRAMUSCULAR | Status: DC | PRN
  Administered 2017-06-24: 2 mg via INTRAVENOUS

## 2017-06-24 MED ORDER — DOCUSATE SODIUM 100 MG PO CAPS: 100.0000 mg | ORAL_CAPSULE | Freq: Two times a day (BID) | ORAL | 0 refills | Status: AC

## 2017-06-24 MED ORDER — ROCURONIUM BROMIDE 10 MG/ML (PF) SYRINGE
PREFILLED_SYRINGE | INTRAVENOUS | Status: DC | PRN
  Administered 2017-06-24: 40 mg via INTRAVENOUS

## 2017-06-24 MED ORDER — ONDANSETRON HCL 4 MG/2ML IJ SOLN
INTRAMUSCULAR | Status: AC
  Filled 2017-06-24: qty 2

## 2017-06-24 MED ORDER — HYDROMORPHONE HCL-NACL 0.5-0.9 MG/ML-% IV SOSY
PREFILLED_SYRINGE | INTRAVENOUS | Status: AC
  Filled 2017-06-24: qty 2

## 2017-06-24 MED ORDER — SODIUM CHLORIDE 0.9 % IV SOLN: 250.0000 mL | INTRAVENOUS | Status: DC | PRN

## 2017-06-24 MED ORDER — HYDROMORPHONE HCL-NACL 0.5-0.9 MG/ML-% IV SOSY
0.2500 mg | PREFILLED_SYRINGE | INTRAVENOUS | Status: DC | PRN
  Administered 2017-06-24 (×4): 0.25 mg via INTRAVENOUS

## 2017-06-24 MED ORDER — SUGAMMADEX SODIUM 200 MG/2ML IV SOLN
INTRAVENOUS | Status: AC
  Filled 2017-06-24: qty 2

## 2017-06-24 MED ORDER — LACTATED RINGERS IR SOLN
Status: DC | PRN
  Administered 2017-06-24: 1000 mL

## 2017-06-24 MED ORDER — BUPIVACAINE-EPINEPHRINE 0.25% -1:200000 IJ SOLN
INTRAMUSCULAR | Status: DC | PRN
  Administered 2017-06-24: 30 mL

## 2017-06-24 MED ORDER — CELECOXIB 200 MG PO CAPS
400.0000 mg | ORAL_CAPSULE | ORAL | Status: AC
  Administered 2017-06-24: 400 mg via ORAL
  Filled 2017-06-24: qty 2

## 2017-06-24 MED ORDER — FENTANYL CITRATE (PF) 100 MCG/2ML IJ SOLN: 25.0000 ug | INTRAMUSCULAR | Status: DC | PRN

## 2017-06-24 MED ORDER — FENTANYL CITRATE (PF) 100 MCG/2ML IJ SOLN
INTRAMUSCULAR | Status: DC | PRN
  Administered 2017-06-24 (×3): 50 ug via INTRAVENOUS

## 2017-06-24 MED ORDER — IOPAMIDOL (ISOVUE-300) INJECTION 61%
INTRAVENOUS | Status: AC
  Filled 2017-06-24: qty 50

## 2017-06-24 MED ORDER — ONDANSETRON HCL 4 MG/2ML IJ SOLN
4.0000 mg | Freq: Once | INTRAMUSCULAR | Status: AC
  Administered 2017-06-24: 4 mg via INTRAVENOUS
  Filled 2017-06-24: qty 2

## 2017-06-24 MED ORDER — ROCURONIUM BROMIDE 50 MG/5ML IV SOSY
PREFILLED_SYRINGE | INTRAVENOUS | Status: AC
  Filled 2017-06-24: qty 5

## 2017-06-24 MED ORDER — FENTANYL CITRATE (PF) 250 MCG/5ML IJ SOLN
INTRAMUSCULAR | Status: AC
  Filled 2017-06-24: qty 5

## 2017-06-24 MED ORDER — SODIUM CHLORIDE 0.9% FLUSH: 3.0000 mL | INTRAVENOUS | Status: DC | PRN

## 2017-06-24 MED ORDER — ACETAMINOPHEN 325 MG PO TABS: 650.0000 mg | ORAL_TABLET | ORAL | Status: DC | PRN

## 2017-06-24 MED ORDER — DEXAMETHASONE SODIUM PHOSPHATE 10 MG/ML IJ SOLN
INTRAMUSCULAR | Status: AC
  Filled 2017-06-24: qty 1

## 2017-06-24 MED ORDER — OXYCODONE HCL 5 MG PO TABS: 5.0000 mg | ORAL_TABLET | ORAL | Status: DC | PRN

## 2017-06-24 MED ORDER — HYDROCODONE-ACETAMINOPHEN 5-325 MG PO TABS: 1.0000 | ORAL_TABLET | Freq: Four times a day (QID) | ORAL | 0 refills | Status: DC | PRN

## 2017-06-24 MED ORDER — PROPOFOL 10 MG/ML IV BOLUS
INTRAVENOUS | Status: DC | PRN
  Administered 2017-06-24: 180 mg via INTRAVENOUS

## 2017-06-24 MED ORDER — PROMETHAZINE HCL 25 MG/ML IJ SOLN: 6.2500 mg | INTRAMUSCULAR | Status: DC | PRN

## 2017-06-24 MED ORDER — ACETAMINOPHEN 650 MG RE SUPP
650.0000 mg | RECTAL | Status: DC | PRN
  Filled 2017-06-24: qty 1

## 2017-06-24 MED ORDER — SUGAMMADEX SODIUM 200 MG/2ML IV SOLN
INTRAVENOUS | Status: DC | PRN
  Administered 2017-06-24: 200 mg via INTRAVENOUS

## 2017-06-24 SURGICAL SUPPLY — 36 items
APPLIER CLIP ROT 10 11.4 M/L (STAPLE) ×2
CABLE HIGH FREQUENCY MONO STRZ (ELECTRODE) ×2 IMPLANT
CHLORAPREP W/TINT 26ML (MISCELLANEOUS) ×2 IMPLANT
CLIP APPLIE ROT 10 11.4 M/L (STAPLE) ×1 IMPLANT
COVER MAYO STAND STRL (DRAPES) IMPLANT
COVER SURGICAL LIGHT HANDLE (MISCELLANEOUS) ×2 IMPLANT
DECANTER SPIKE VIAL GLASS SM (MISCELLANEOUS) ×2 IMPLANT
DERMABOND ADVANCED (GAUZE/BANDAGES/DRESSINGS) ×1
DERMABOND ADVANCED .7 DNX12 (GAUZE/BANDAGES/DRESSINGS) ×1 IMPLANT
DRAPE C-ARM 42X120 X-RAY (DRAPES) IMPLANT
ELECT PENCIL ROCKER SW 15FT (MISCELLANEOUS) ×2 IMPLANT
ELECT REM PT RETURN 15FT ADLT (MISCELLANEOUS) ×2 IMPLANT
GLOVE BIO SURGEON STRL SZ 6 (GLOVE) ×2 IMPLANT
GLOVE INDICATOR 6.5 STRL GRN (GLOVE) ×2 IMPLANT
GOWN STRL REUS W/TWL LRG LVL3 (GOWN DISPOSABLE) ×4 IMPLANT
GOWN STRL REUS W/TWL XL LVL3 (GOWN DISPOSABLE) ×2 IMPLANT
GRASPER SUT TROCAR 14GX15 (MISCELLANEOUS) ×2 IMPLANT
HEMOSTAT SNOW SURGICEL 2X4 (HEMOSTASIS) IMPLANT
IRRIG SUCT STRYKERFLOW 2 WTIP (MISCELLANEOUS)
IRRIGATION SUCT STRKRFLW 2 WTP (MISCELLANEOUS) IMPLANT
KIT BASIN OR (CUSTOM PROCEDURE TRAY) ×2 IMPLANT
L-HOOK LAP DISP 36CM (ELECTROSURGICAL) ×2
LHOOK LAP DISP 36CM (ELECTROSURGICAL) ×1 IMPLANT
NEEDLE INSUFFLATION 14GA 120MM (NEEDLE) ×2 IMPLANT
POUCH SPECIMEN RETRIEVAL 10MM (ENDOMECHANICALS) ×2 IMPLANT
SCISSORS LAP 5X35 DISP (ENDOMECHANICALS) ×2 IMPLANT
SET CHOLANGIOGRAPH MIX (MISCELLANEOUS) IMPLANT
SET IRRIG TUBING LAPAROSCOPIC (IRRIGATION / IRRIGATOR) ×2 IMPLANT
SLEEVE XCEL OPT CAN 5 100 (ENDOMECHANICALS) ×4 IMPLANT
SUT MNCRL AB 4-0 PS2 18 (SUTURE) ×2 IMPLANT
TOWEL OR 17X26 10 PK STRL BLUE (TOWEL DISPOSABLE) ×2 IMPLANT
TOWEL OR NON WOVEN STRL DISP B (DISPOSABLE) IMPLANT
TRAY LAPAROSCOPIC (CUSTOM PROCEDURE TRAY) ×2 IMPLANT
TROCAR BLADELESS OPT 5 100 (ENDOMECHANICALS) ×2 IMPLANT
TROCAR XCEL 12X100 BLDLESS (ENDOMECHANICALS) ×2 IMPLANT
TUBING INSUF HEATED (TUBING) ×2 IMPLANT

## 2017-06-24 NOTE — H&P (View-Only) (Signed)
Dawn Wu 06/16/2017 2:14 PM Location: [place] Office Patient #: [id number] DOB: 01/02/1986 Single / Language: [name] / Race: Refused to Report/Unreported Female  History of Present Illness (Eily Louvier A. Fredricka Bonine MD; 06/16/2017 2:43 PM) Patient words: this is an otherwise healthy 31 year old woman is referred for biliary dyskinesia. She began having symptoms about a month ago with vague upper abdominal pain related to meals which she thought was indigestion. About 3 weeks ago the pain became more focally specific to the right upper quadrant and became associated with associated with nausea and specifically triggered by meals that are fatty in nature. She underwent a CT scan on July 25 that showed hepatomegaly with hepatic stay ptosis and focal fatty sparing adjacent to the gallbladder fossa and falciform ligament but no other abnormalities. subsequently she got her upper quadrant ultrasound that showed no gallstones or wall thickening, common bile duct 4 mm. similar findings of enlarged fatty liver but otherwise normal values. she underwent a HIDA scan about 10 days ago that did reveal an abnormally low ejection fraction of 10% of the gallbladder and abdominal cramping with the oral ensure consumption. her labwork in July which included a CBC and CMP were within normal limits. she's never had abdominal surgery. She is a 54-month-old baby. 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  The patient is a 31 year old female.   Allergies (Armen Sherrine Maples, CMA; 06/16/2017 2:18 PM) SHELLFISH Anaphylaxis. NuvaRing *CONTRACEPTIVES* Hives.  Medication History Juanita Craver, CMA; 06/16/2017 2:19 PM) Blain Pais 1/35E (28) (1-35MG -MCG Tablet, Oral) Active. Omeprazole (40MG  Capsule DR, Oral) Active. ZyrTEC (10MG  Tablet, Oral) Active. Ondansetron HCl (4MG  Tablet, Oral) Active. Ibuprofen (600MG  Tablet, Oral) Active. Medications Reconciled     Review of Systems (Santos Hardwick  A. Fredricka Bonine MD; 06/16/2017 2:44 PM) All other systems negative  Vitals (Armen Glenn CMA; 06/16/2017 2:17 PM) 06/16/2017 2:16 PM Weight: 166 lb Height: 62in Body Surface Area: 1.77 m Body Mass Index: 30.36 kg/m  Temp.: 97.29F  Pulse: 91 (Regular)  P.OX: 97% (Room air) BP: 100/70 (Sitting, Left Arm, Standard)      Physical Exam (Julieann Drummonds A. Fredricka Bonine MD; 06/16/2017 2:45 PM)  General Note: alert, well-appearing  Integumentary Note: skin is warm and dry  Eye Note: no scleral icterus. Pupils equally round and reactive.  Chest and Lung Exam Note: labored respirations. Symmetrical air entry.  Cardiovascular Note: regular rate and rhythm. No pedal edema.  Abdomen Note: soft, nontender except mildly in the right subcostal margin midclavicular line. No distention, no mass or organomegaly. No rebound or guarding  Neurologic Note: grossly intact, normal gait  Neuropsychiatric Note: normal mood and affect, appropriate insight  Musculoskeletal Note: strength symmetrical throughout, no deformity    Assessment & Plan (Jawanza Zambito A. La Dibella MD; 06/16/2017 2:46 PM)  BILIARY DYSKINESIA (K82.8) Story: Her symptoms are consistent with biliary etiology in the setting of abnormal HIDA. I offered her laparoscopic cholecystectomy. We discussed the surgery including risks of bleeding, infection, pain, scarring, intra-abdominal injury specifically to the common bile duct and sequelae, conversion to open surgery. We also discussed the very small possibility that her symptoms are not resolved after laparoscopic cholecystectomy in which further workup perhaps with endoscopy may to be pursued. We discussed the finding of fatty liver and the implications of that. All of her questions were answered. We will get her scheduled in the coming weeks. I recommend that she stick to a low-fat diet in the interim between now and surgery to minimize her risk of  recurrent attacks, and we discussed signs  and symptoms that should prompt her to visit the emergency room.

## 2017-06-24 NOTE — Op Note (Signed)
Operative Note  Madissyn Roberds 31 y.o. female [id number]  06/24/2017  Surgeon: Berna Bue   Assistant: OR staff  Procedure performed: Laparoscopic Cholecystectomy  Preop diagnosis: biliary dyskinesia Post-op diagnosis/intraop findings: same, fatty liver  Specimens: gallbladder  EBL: minimal  Complications: none  Description of procedure: After obtaining informed consent the patient was brought to the operating room. Prophylactic antibiotics and subcutaneous heparin were administered. SCD's were applied. General endotracheal anesthesia was initiated and a formal time-out was performed. The abdomen was prepped and draped in the usual sterile fashion and the abdomen was entered using an infraumbilical veress needle after instilling the site with local. Insufflation to was obtained and gross inspection revealed no evidence of injury from our entry or other intraabdominal abnormalities. The liver was enlarged and hyperemic with rounded edges. Two 38mm trocars were introduced in the right midclavicular and right anterior axillary lines under direct visualization and following infiltration with local. An 20mm trocar was placed in the epigastrium. The gallbladder was retracted cephalad and the infundibulum was retracted laterally. A combination of hook electrocautery and blunt dissection was utilized to clear the peritoneum from the neck and cystic duct, circumferentially isolating the cystic artery and cystic duct and lifting the gallbladder from the cystic plate. The critical view of safety was achieved with the cystic artery, cystic duct, and liver bed visualized between them with no other structures. The artery was clipped with a single clip proximally and distally and divided as was the cystic duct with two clips on the proximal end. The gallbladder was dissected from the liver plate using electrocautery. A posterior branch cystic artery was encountered and addressed with an additional  clip. Once freed the gallbladder was placed in an endocatch bag and removed through the epigastric trocar site. Some bile had been spilled from the gallbladder during its dissection from the liver bed. This was aspirated and the right upper quadrant was irrigated copiously; the effluent was clear. Hemostasis was once again confirmed, and reinspection of the abdomen revealed no injuries. The clips were well opposed without any bile leak from the duct or the liver bed. The 75mm trocar site in the epigastrium was closed with a 0 vicryl in the fascia under direct visualization using a PMI device. The abdomen was desufflated and all trocars removed. The skin incisions were closed with running subcuticular monocryl and Dermabond. The patient was awakened, extubated and transported to the recovery room in stable condition.   All counts were correct at the completion of the case.

## 2017-06-24 NOTE — Interval H&P Note (Signed)
History and Physical Interval Note:  06/24/2017 8:47 AM  Dawn Wu  has presented today for surgery, with the diagnosis of biliary dyskinesia  The various methods of treatment have been discussed with the patient and family. After consideration of risks, benefits and other options for treatment, the patient has consented to  Procedure(s): LAPAROSCOPIC CHOLECYSTECTOMY (N/A) as a surgical intervention .  The patient's history has been reviewed, patient examined, no change in status, stable for surgery.  I have reviewed the patient's chart and labs.  Questions were answered to the patient's satisfaction.     Garl Speigner Lollie Sails

## 2017-06-24 NOTE — Anesthesia Postprocedure Evaluation (Signed)
Anesthesia Post Note  Patient: Dawn Wu  Procedure(s) Performed: Procedure(s) (LRB): LAPAROSCOPIC CHOLECYSTECTOMY (N/A)     Patient location during evaluation: PACU Anesthesia Type: General Level of consciousness: awake and alert Pain management: pain level controlled Vital Signs Assessment: post-procedure vital signs reviewed and stable Respiratory status: spontaneous breathing, nonlabored ventilation, respiratory function stable and patient connected to nasal cannula oxygen Cardiovascular status: blood pressure returned to baseline and stable Postop Assessment: no signs of nausea or vomiting Anesthetic complications: no    Last Vitals:  Vitals:   06/24/17 1115 06/24/17 1124  BP: 133/72 119/65  Pulse: 87 89  Resp: 18 16  Temp: 36.8 C 36.9 C  SpO2: 93% 97%    Last Pain:  Vitals:   06/24/17 1132  TempSrc:   PainSc: 1                  Kennieth Rad

## 2017-06-24 NOTE — Anesthesia Preprocedure Evaluation (Addendum)
Anesthesia Evaluation  Patient identified by MRN, date of birth, ID band Patient awake    Reviewed: Allergy & Precautions, NPO status , Patient's Chart, lab work & pertinent test results  Airway Mallampati: II  TM Distance: >3 FB Neck ROM: Full    Dental  (+) Dental Advisory Given   Pulmonary neg pulmonary ROS,    breath sounds clear to auscultation       Cardiovascular negative cardio ROS   Rhythm:Regular Rate:Normal     Neuro/Psych negative neurological ROS     GI/Hepatic Neg liver ROS, GERD  ,  Endo/Other  negative endocrine ROS  Renal/GU negative Renal ROS     Musculoskeletal   Abdominal   Peds  Hematology negative hematology ROS (+)   Anesthesia Other Findings   Reproductive/Obstetrics                             Lab Results  Component Value Date   WBC 9.4 05/28/2017   HGB 13.3 05/28/2017   HCT 38.6 05/28/2017   MCV 80.6 05/28/2017   PLT 202 05/28/2017   Lab Results  Component Value Date   CREATININE 0.69 05/28/2017   BUN 13 05/28/2017   NA 133 (L) 05/28/2017   K 3.3 (L) 05/28/2017   CL 100 (L) 05/28/2017   CO2 22 05/28/2017    Anesthesia Physical Anesthesia Plan  ASA: I  Anesthesia Plan: General   Post-op Pain Management:    Induction: Intravenous  PONV Risk Score and Plan: 4 or greater and Ondansetron, Dexamethasone, Midazolam, Scopolamine patch - Pre-op and Treatment may vary due to age or medical condition  Airway Management Planned: Oral ETT  Additional Equipment:   Intra-op Plan:   Post-operative Plan: Extubation in OR  Informed Consent: I have reviewed the patients History and Physical, chart, labs and discussed the procedure including the risks, benefits and alternatives for the proposed anesthesia with the patient or authorized representative who has indicated his/her understanding and acceptance.   Dental advisory given  Plan Discussed with:  CRNA  Anesthesia Plan Comments:         Anesthesia Quick Evaluation

## 2017-06-24 NOTE — Transfer of Care (Signed)
Immediate Anesthesia Transfer of Care Note  Patient: Dawn Wu  Procedure(s) Performed: Procedure(s): LAPAROSCOPIC CHOLECYSTECTOMY (N/A)  Patient Location: PACU  Anesthesia Type:General  Level of Consciousness: awake, alert , oriented and patient cooperative  Airway & Oxygen Therapy: Patient Spontanous Breathing and Patient connected to face mask oxygen  Post-op Assessment: Report given to RN, Post -op Vital signs reviewed and stable and Patient moving all extremities  Post vital signs: Reviewed and stable  Last Vitals:  Vitals:   06/24/17 0743  BP: 126/76  Pulse: 79  Resp: 16  Temp: 36.6 C  SpO2: 100%    Last Pain:  Vitals:   06/24/17 0743  TempSrc: Oral      Patients Stated Pain Goal: 4 (06/24/17 0837)  Complications: No apparent anesthesia complications

## 2017-06-24 NOTE — Anesthesia Procedure Notes (Signed)
Procedure Name: Intubation Date/Time: 06/24/2017 9:30 AM Performed by: Carleene Cooper A Pre-anesthesia Checklist: Patient identified, Emergency Drugs available, Suction available, Timeout performed and Patient being monitored Patient Re-evaluated:Patient Re-evaluated prior to induction Oxygen Delivery Method: Circle system utilized Preoxygenation: Pre-oxygenation with 100% oxygen Induction Type: IV induction Ventilation: Mask ventilation without difficulty Laryngoscope Size: Mac and 4 Grade View: Grade I Tube type: Oral Tube size: 7.5 mm Number of attempts: 1 Airway Equipment and Method: Stylet Placement Confirmation: ETT inserted through vocal cords under direct vision,  positive ETCO2 and breath sounds checked- equal and bilateral Secured at: 21 cm Tube secured with: Tape Dental Injury: Teeth and Oropharynx as per pre-operative assessment

## 2017-06-24 NOTE — Discharge Instructions (Signed)
LAPAROSCOPIC SURGERY: POST OP INSTRUCTIONS  ######################################################################  EAT Gradually transition to a high fiber diet with a fiber supplement over the next few weeks after discharge.  Start with a pureed / full liquid diet (see below)  WALK Walk an hour a day.  Control your pain to do that.    CONTROL PAIN Control pain so that you can walk, sleep, tolerate sneezing/coughing, go up/down stairs.  HAVE A BOWEL MOVEMENT DAILY Keep your bowels regular to avoid problems.  OK to try a laxative to override constipation.  OK to use an antidairrheal to slow down diarrhea.  Call if not better after 2 tries  CALL IF YOU HAVE PROBLEMS/CONCERNS Call if you are still struggling despite following these instructions. Call if you have concerns not answered by these instructions  ######################################################################    1. DIET: Follow a light bland diet the first 24 hours after arrival home, such as soup, liquids, crackers, etc.  Be sure to include lots of fluids daily.  Avoid fast food or heavy meals as your are more likely to get nauseated.  Eat a low fat the next few days after surgery.   2. Take your usually prescribed home medications unless otherwise directed. 3. PAIN CONTROL: a. Pain is best controlled by a usual combination of three different methods TOGETHER: i. Ice/Heat ii. Over the counter pain medication iii. Prescription pain medication b. Most patients will experience some swelling and bruising around the incisions.  Ice packs or heating pads (30-60 minutes up to 6 times a day) will help. Use ice for the first few days to help decrease swelling and bruising, then switch to heat to help relax tight/sore spots and speed recovery.  Some people prefer to use ice alone, heat alone, alternating between ice & heat.  Experiment to what works for you.  Swelling and bruising can take several weeks to resolve.   c. It is  helpful to take an over-the-counter pain medication regularly for the first few weeks.  Choose one of the following that works best for you: i. Naproxen (Aleve, etc)  Two 251m tabs twice a day ii. Ibuprofen (Advil, etc) Three 2053mtabs four times a day (every meal & bedtime) iii. Acetaminophen (Tylenol, etc) 500-65044mour times a day (every meal & bedtime) d. A  prescription for pain medication (such as oxycodone, hydrocodone, etc) should be given to you upon discharge.  Take your pain medication as prescribed.  i. If you are having problems/concerns with the prescription medicine (does not control pain, nausea, vomiting, rash, itching, etc), please call us [name] see if we need to switch you to a different pain medicine that will work better for you and/or control your side effect better. ii. If you need a refill on your pain medication, please contact your pharmacy.  They will contact our office to request authorization. Prescriptions will not be filled after 5 pm or on week-ends. 4. Avoid getting constipated.  Between the surgery and the pain medications, it is common to experience some constipation.  Increasing fluid intake and taking a fiber supplement (such as Metamucil, Citrucel, FiberCon, MiraLax, etc) 1-2 times a day regularly will usually help prevent this problem from occurring.  A mild laxative (prune juice, Milk of Magnesia, MiraLax, etc) should be taken according to package directions if there are no bowel movements after 48 hours.   5. Watch out for diarrhea.  If you have many loose bowel movements, simplify your diet to bland foods & liquids for  a few days.  Stop any stool softeners and decrease your fiber supplement.  Switching to mild anti-diarrheal medications (Kayopectate, Pepto Bismol) can help.  If this worsens or does not improve, please call us. 6. Wash / shower every day.  You may shower over the dressings as they are waterproof.  Continue to shower over incision(s)  after the dressing is off. 7. Remove your waterproof bandages 5 days after surgery.  You may leave the incision open to air.  You may replace a dressing/Band-Aid to cover the incision for comfort if you wish.  8. ACTIVITIES as tolerated:   a. You may resume regular (light) daily activities beginning the next day--such as daily self-care, walking, climbing stairs--gradually increasing activities as tolerated.  If you can walk 30 minutes without difficulty, it is safe to try more intense activity such as jogging, treadmill, bicycling, low-impact aerobics, swimming, etc. b. Save the most intensive and strenuous activity for last such as sit-ups, heavy lifting, contact sports, etc  Refrain from any heavy lifting or straining until you are off narcotics for pain control.   c. DO NOT PUSH THROUGH PAIN.  Let pain be your guide: If it hurts to do something, don't do it.  Pain is your body warning you to avoid that activity for another week until the pain goes down. d. You may drive when you are no longer taking prescription pain medication, you can comfortably wear a seatbelt, and you can safely maneuver your car and apply brakes. e. Bonita Quin may have sexual intercourse when it is comfortable.  9. FOLLOW UP in our office a. Please call [facility] at [phone] to set up an appointment to see your surgeon in the office for a follow-up appointment approximately 2-3 weeks after your surgery. b. Make sure that you call for this appointment the day you arrive home to insure a convenient appointment time. 10. IF YOU HAVE DISABILITY OR FAMILY LEAVE FORMS, BRING THEM TO THE OFFICE FOR PROCESSING.  DO NOT GIVE THEM TO YOUR DOCTOR.   WHEN TO CALL us [phone]: 1. Poor pain control 2. Reactions / problems with new medications (rash/itching, nausea, etc)  3. Fever over 101.5 F (38.5 C) 4. Inability to urinate 5. Nausea and/or vomiting 6. Worsening swelling or bruising 7. Continued bleeding from incision. 8. Increased  pain, redness, or drainage from the incision   The clinic staff is available to answer your questions during regular business hours (8:30am-5pm).  Please dont hesitate to call and ask to speak to one of our nurses for clinical concerns.   If you have a medical emergency, go to the nearest emergency room or call 911.  A surgeon from [facility] is always on call at the [facility], [place] [address], [place], [place]  [address] ? MAIN: [phone] ? TOLL FREE: [phone] ?  FAX [phone] [email or web address]   General Anesthesia, Adult, Care After These instructions provide you with information about caring for yourself after your procedure. Your health care provider may also give you more specific instructions. Your treatment has been planned according to current medical practices, but problems sometimes occur. Call your health care provider if you have any problems or questions after your procedure. What can I expect after the procedure? After the procedure, it is common to have:  Vomiting.  A sore throat.  Mental slowness.  It is common to feel:  Nauseous.  Cold or shivery.  Sleepy.  Tired.  Sore or achy, even in parts of your body where you did not have surgery.  Follow these instructions at home: For at least 24 hours after the procedure:  Do not: ? Participate in activities where you could fall or become injured. ? Drive. ? Use heavy machinery. ? Drink alcohol. ? Take sleeping pills or medicines that cause drowsiness. ? Make important decisions or sign legal documents. ? Take care of children on your own.  Rest. Eating and drinking  If you vomit, drink water, juice, or soup when you can drink without vomiting.  Drink enough fluid to keep your urine clear or pale yellow.  Make sure you have little or no nausea before eating solid foods.  Follow the diet recommended by your health  care provider. General instructions  Have a responsible adult stay with you until you are awake and alert.  Return to your normal activities as told by your health care provider. Ask your health care provider what activities are safe for you.  Take over-the-counter and prescription medicines only as told by your health care provider.  If you smoke, do not smoke without supervision.  Keep all follow-up visits as told by your health care provider. This is important. Contact a health care provider if:  You continue to have nausea or vomiting at home, and medicines are not helpful.  You cannot drink fluids or start eating again.  You cannot urinate after 8-12 hours.  You develop a skin rash.  You have fever.  You have increasing redness at the site of your procedure. Get help right away if:  You have difficulty breathing.  You have chest pain.  You have unexpected bleeding.  You feel that you are having a life-threatening or urgent problem. This information is not intended to replace advice given to you by your health care provider. Make sure you discuss any questions you have with your health care provider. Document Released: 01/27/2001 Document Revised: 03/25/2016 Document Reviewed: 10/05/2015 Elsevier Interactive Patient Education  [employer].

## 2017-06-30 ENCOUNTER — Ambulatory Visit: Payer: 59 | Admitting: Allergy

## 2017-06-30 MED FILL — OMEPRAZOLE DR 40 MG CAPSULE: 40 | 30 days supply | Qty: 30 | Fill #1

## 2017-06-30 MED FILL — ZOVIA 1-35E TABLET: 1-35 | 84 days supply | Qty: 84 | Fill #1

## 2017-07-02 ENCOUNTER — Ambulatory Visit (INDEPENDENT_AMBULATORY_CARE_PROVIDER_SITE_OTHER): Payer: 59 | Admitting: *Deleted

## 2017-07-02 DIAGNOSIS — J309 Allergic rhinitis, unspecified: Secondary | ICD-10-CM

## 2017-07-02 NOTE — Progress Notes (Signed)
Ms. Tamera PuntMiranda experienced throat clearing and nasal congestion following her injection today. She also had a dime sized swelling with marked erythema around the injection. We did obtain vitals and her BP was normal. She did have a low pulse ox initially but overtime it did increase to 98%. We gave her 25mg  benadryl and 10mg  cetirizine. She otherwise did well.   We will decrease to Green 0.7425mL next week and advance from there.  Malachi BondsJoel Jovanni Eckhart, MD [facility]

## 2017-07-03 ENCOUNTER — Encounter: Payer: Self-pay | Admitting: *Deleted

## 2017-07-03 NOTE — Progress Notes (Signed)
Diluent vials back to green due to reaction.

## 2017-07-10 ENCOUNTER — Ambulatory Visit (INDEPENDENT_AMBULATORY_CARE_PROVIDER_SITE_OTHER): Payer: 59 | Admitting: *Deleted

## 2017-07-10 DIAGNOSIS — J309 Allergic rhinitis, unspecified: Secondary | ICD-10-CM

## 2017-07-17 ENCOUNTER — Ambulatory Visit (INDEPENDENT_AMBULATORY_CARE_PROVIDER_SITE_OTHER): Payer: 59 | Admitting: *Deleted

## 2017-07-17 DIAGNOSIS — J309 Allergic rhinitis, unspecified: Secondary | ICD-10-CM | POA: Diagnosis not present

## 2017-07-30 ENCOUNTER — Ambulatory Visit (INDEPENDENT_AMBULATORY_CARE_PROVIDER_SITE_OTHER): Payer: 59

## 2017-07-30 DIAGNOSIS — J309 Allergic rhinitis, unspecified: Secondary | ICD-10-CM | POA: Diagnosis not present

## 2017-08-06 MED FILL — OMEPRAZOLE DR 40 MG CAPSULE: 40 | 30 days supply | Qty: 30 | Fill #2

## 2017-08-07 ENCOUNTER — Ambulatory Visit (INDEPENDENT_AMBULATORY_CARE_PROVIDER_SITE_OTHER): Payer: 59

## 2017-08-07 DIAGNOSIS — J309 Allergic rhinitis, unspecified: Secondary | ICD-10-CM | POA: Diagnosis not present

## 2017-08-14 ENCOUNTER — Telehealth: Payer: Self-pay

## 2017-08-14 DIAGNOSIS — R059 Cough, unspecified: Secondary | ICD-10-CM

## 2017-08-14 DIAGNOSIS — R05 Cough: Secondary | ICD-10-CM

## 2017-08-14 NOTE — Telephone Encounter (Signed)
Patient came in c/o cough, chest congestion, chest tightness, hoarseness. Per Dr. Elsie Stain nebulizer was given to patient.

## 2017-08-15 DIAGNOSIS — E282 Polycystic ovarian syndrome: Secondary | ICD-10-CM | POA: Insufficient documentation

## 2017-08-15 DIAGNOSIS — Z0000 Encounter for general adult medical examination without abnormal findings: Secondary | ICD-10-CM | POA: Diagnosis not present

## 2017-08-15 DIAGNOSIS — E669 Obesity, unspecified: Secondary | ICD-10-CM | POA: Diagnosis not present

## 2017-08-15 NOTE — Telephone Encounter (Signed)
Assunta did show marked improvement with the Xopenex nebulizer treatment. We decided to start her on a prednisone burst and start her on Qvar 2-4 puffs BID for 1-2 weeks during respiratory flares. Samples provided. I called Ms. Lauman this morning and she reported feeling much better on the prednisone.  [name], MD [facility]

## 2017-08-18 NOTE — Telephone Encounter (Signed)
Noted. Thanks.

## 2017-08-22 ENCOUNTER — Encounter: Payer: Self-pay | Admitting: Allergy & Immunology

## 2017-08-27 ENCOUNTER — Ambulatory Visit (INDEPENDENT_AMBULATORY_CARE_PROVIDER_SITE_OTHER): Payer: 59 | Admitting: *Deleted

## 2017-08-27 DIAGNOSIS — J309 Allergic rhinitis, unspecified: Secondary | ICD-10-CM

## 2017-09-08 ENCOUNTER — Ambulatory Visit (INDEPENDENT_AMBULATORY_CARE_PROVIDER_SITE_OTHER): Payer: 59 | Admitting: *Deleted

## 2017-09-08 DIAGNOSIS — J309 Allergic rhinitis, unspecified: Secondary | ICD-10-CM | POA: Diagnosis not present

## 2017-09-11 MED FILL — ZOVIA 1-35E TABLET: 1-35 | 84 days supply | Qty: 84 | Fill #2

## 2017-09-11 MED FILL — OMEPRAZOLE DR 40 MG CAPSULE: 40 | 90 days supply | Qty: 90 | Fill #3

## 2017-09-11 NOTE — Progress Notes (Signed)
VIALS EXP 09-12-18 

## 2017-09-12 DIAGNOSIS — J3089 Other allergic rhinitis: Secondary | ICD-10-CM | POA: Diagnosis not present

## 2017-09-17 ENCOUNTER — Ambulatory Visit (INDEPENDENT_AMBULATORY_CARE_PROVIDER_SITE_OTHER): Payer: 59 | Admitting: *Deleted

## 2017-09-17 DIAGNOSIS — J309 Allergic rhinitis, unspecified: Secondary | ICD-10-CM | POA: Diagnosis not present

## 2017-09-24 ENCOUNTER — Ambulatory Visit (INDEPENDENT_AMBULATORY_CARE_PROVIDER_SITE_OTHER): Payer: 59

## 2017-09-24 DIAGNOSIS — J309 Allergic rhinitis, unspecified: Secondary | ICD-10-CM | POA: Diagnosis not present

## 2017-10-03 DIAGNOSIS — N760 Acute vaginitis: Secondary | ICD-10-CM | POA: Diagnosis not present

## 2017-10-06 ENCOUNTER — Ambulatory Visit (INDEPENDENT_AMBULATORY_CARE_PROVIDER_SITE_OTHER): Payer: 59 | Admitting: *Deleted

## 2017-10-06 DIAGNOSIS — J309 Allergic rhinitis, unspecified: Secondary | ICD-10-CM | POA: Diagnosis not present

## 2017-10-07 MED FILL — EPINEPHRINE 0.3 MG AUTO-INJ: 0.3 | 30 days supply | Qty: 2 | Fill #1

## 2017-10-16 ENCOUNTER — Ambulatory Visit (INDEPENDENT_AMBULATORY_CARE_PROVIDER_SITE_OTHER): Payer: 59

## 2017-10-16 DIAGNOSIS — J309 Allergic rhinitis, unspecified: Secondary | ICD-10-CM | POA: Diagnosis not present

## 2017-11-11 ENCOUNTER — Ambulatory Visit (INDEPENDENT_AMBULATORY_CARE_PROVIDER_SITE_OTHER): Payer: 59 | Admitting: *Deleted

## 2017-11-11 DIAGNOSIS — J309 Allergic rhinitis, unspecified: Secondary | ICD-10-CM | POA: Diagnosis not present

## 2017-11-19 ENCOUNTER — Ambulatory Visit (INDEPENDENT_AMBULATORY_CARE_PROVIDER_SITE_OTHER): Payer: 59

## 2017-11-19 ENCOUNTER — Other Ambulatory Visit: Payer: Self-pay

## 2017-11-19 DIAGNOSIS — J309 Allergic rhinitis, unspecified: Secondary | ICD-10-CM | POA: Diagnosis not present

## 2017-11-19 MED ORDER — ALBUTEROL SULFATE HFA 108 (90 BASE) MCG/ACT IN AERS: 1.0000 | INHALATION_SPRAY | Freq: Four times a day (QID) | RESPIRATORY_TRACT | 0 refills | Status: DC | PRN

## 2017-11-19 MED FILL — VENTOLIN HFA 90 MCG INHALER: 108 (90 BAS | 25 days supply | Qty: 18 | Fill #0

## 2017-12-01 ENCOUNTER — Ambulatory Visit (INDEPENDENT_AMBULATORY_CARE_PROVIDER_SITE_OTHER): Payer: 59

## 2017-12-01 DIAGNOSIS — J309 Allergic rhinitis, unspecified: Secondary | ICD-10-CM | POA: Diagnosis not present

## 2017-12-04 ENCOUNTER — Telehealth: Payer: Self-pay

## 2017-12-04 DIAGNOSIS — T7800XD Anaphylactic reaction due to unspecified food, subsequent encounter: Secondary | ICD-10-CM

## 2017-12-04 DIAGNOSIS — R1011 Right upper quadrant pain: Secondary | ICD-10-CM | POA: Diagnosis not present

## 2017-12-04 DIAGNOSIS — K760 Fatty (change of) liver, not elsewhere classified: Secondary | ICD-10-CM | POA: Diagnosis not present

## 2017-12-04 DIAGNOSIS — K219 Gastro-esophageal reflux disease without esophagitis: Secondary | ICD-10-CM | POA: Diagnosis not present

## 2017-12-04 MED FILL — PANTOPRAZOLE SOD DR 40 MG T: 40 | 90 days supply | Qty: 90 | Fill #0

## 2017-12-04 NOTE — Telephone Encounter (Signed)
Reorder lab 

## 2017-12-07 LAB — ALLERGEN PROFILE, SHELLFISH
Clam IgE: 0.74 kU/L — AB
F023-IgE Crab: 1.49 kU/L — AB
F080-IGE LOBSTER: 1.62 kU/L — AB
F290-IGE OYSTER: 0.72 kU/L — AB
SHRIMP IGE: 1.92 kU/L — AB
Scallop IgE: 0.88 kU/L — AB

## 2017-12-10 ENCOUNTER — Ambulatory Visit (INDEPENDENT_AMBULATORY_CARE_PROVIDER_SITE_OTHER): Payer: 59

## 2017-12-10 DIAGNOSIS — J309 Allergic rhinitis, unspecified: Secondary | ICD-10-CM | POA: Diagnosis not present

## 2017-12-14 DIAGNOSIS — H52223 Regular astigmatism, bilateral: Secondary | ICD-10-CM | POA: Diagnosis not present

## 2017-12-17 ENCOUNTER — Ambulatory Visit (INDEPENDENT_AMBULATORY_CARE_PROVIDER_SITE_OTHER): Payer: 59

## 2017-12-17 DIAGNOSIS — J309 Allergic rhinitis, unspecified: Secondary | ICD-10-CM

## 2017-12-25 ENCOUNTER — Ambulatory Visit (INDEPENDENT_AMBULATORY_CARE_PROVIDER_SITE_OTHER): Payer: 59

## 2017-12-25 DIAGNOSIS — J309 Allergic rhinitis, unspecified: Secondary | ICD-10-CM

## 2017-12-31 ENCOUNTER — Ambulatory Visit (INDEPENDENT_AMBULATORY_CARE_PROVIDER_SITE_OTHER): Payer: 59

## 2017-12-31 DIAGNOSIS — J309 Allergic rhinitis, unspecified: Secondary | ICD-10-CM | POA: Diagnosis not present

## 2018-01-12 ENCOUNTER — Ambulatory Visit (INDEPENDENT_AMBULATORY_CARE_PROVIDER_SITE_OTHER): Payer: 59

## 2018-01-12 DIAGNOSIS — J309 Allergic rhinitis, unspecified: Secondary | ICD-10-CM | POA: Diagnosis not present

## 2018-01-13 ENCOUNTER — Ambulatory Visit: Payer: 59 | Admitting: Allergy and Immunology

## 2018-01-14 DIAGNOSIS — M79645 Pain in left finger(s): Secondary | ICD-10-CM | POA: Diagnosis not present

## 2018-01-14 MED FILL — NAPROXEN DR 500 MG TABLET: 500 | 15 days supply | Qty: 30 | Fill #0

## 2018-01-16 DIAGNOSIS — Z01419 Encounter for gynecological examination (general) (routine) without abnormal findings: Secondary | ICD-10-CM | POA: Diagnosis not present

## 2018-01-16 DIAGNOSIS — Z6831 Body mass index (BMI) 31.0-31.9, adult: Secondary | ICD-10-CM | POA: Diagnosis not present

## 2018-01-19 ENCOUNTER — Ambulatory Visit (INDEPENDENT_AMBULATORY_CARE_PROVIDER_SITE_OTHER): Payer: 59 | Admitting: Allergy and Immunology

## 2018-01-19 ENCOUNTER — Encounter: Payer: Self-pay | Admitting: Allergy and Immunology

## 2018-01-19 VITALS — BP 106/72 | HR 88 | Temp 97.9°F | Resp 17 | Ht 61.0 in | Wt 167.4 lb

## 2018-01-19 DIAGNOSIS — J019 Acute sinusitis, unspecified: Secondary | ICD-10-CM

## 2018-01-19 DIAGNOSIS — J0110 Acute frontal sinusitis, unspecified: Secondary | ICD-10-CM

## 2018-01-19 DIAGNOSIS — J3089 Other allergic rhinitis: Secondary | ICD-10-CM

## 2018-01-19 DIAGNOSIS — R062 Wheezing: Secondary | ICD-10-CM

## 2018-01-19 DIAGNOSIS — T7800XD Anaphylactic reaction due to unspecified food, subsequent encounter: Secondary | ICD-10-CM

## 2018-01-19 HISTORY — DX: Acute sinusitis, unspecified: J01.90

## 2018-01-19 MED ORDER — FLUTICASONE PROPIONATE 93 MCG/ACT NA EXHU: 2.0000 | INHALANT_SUSPENSION | Freq: Two times a day (BID) | NASAL | 5 refills | Status: DC

## 2018-01-19 MED ORDER — PREDNISONE 1 MG PO TABS: 10.0000 mg | ORAL_TABLET | Freq: Every day | ORAL | Status: AC

## 2018-01-19 MED ORDER — CARBINOXAMINE MALEATE 6 MG PO TABS: 6.0000 mg | ORAL_TABLET | Freq: Four times a day (QID) | ORAL | 5 refills | Status: DC | PRN

## 2018-01-19 NOTE — Addendum Note (Signed)
Addended by: Dub MikesHICKS, ASHLEY N on: 01/19/2018 02:10 PM   Modules accepted: Orders

## 2018-01-19 NOTE — Progress Notes (Signed)
Follow-up Note  RE: Dawn Wu MRN: [id number] DOB: 1986-06-21 Date of Office Visit: 01/19/2018  Primary care provider: Reynaldo MiniumHennessee, Phedra Elizabeth, PA Referring provider: Murlean HarkHennessee, Phedra Eliza*  History of present illness: Dawn AlmMildred Wu is a 32 y.o. female with persistent asthma, allergic rhinitis, and food allergy presenting today for a sick visit.  She was last seen in this clinic in January 2018.  She complains of progressive frontal sinus pressure/pain, nasal congestion, thick postnasal drainage, and coughing over the past 5 days.  Denies fevers, chills, and discolored mucus production.  She denies chest tightness and wheezing.  She has attempted to reduce the sinus pressure/pain with pseudoephedrine without adequate relief.  She avoids shellfish and has access to epinephrine autoinjectors in case of accidental ingestion.   Assessment and plan: Acute sinusitis  Prednisone has been provided, 40 mg x3 days, 20 mg x1 day, 10 mg x1 day, then stop.  A prescription has been provided for RyVent (carbinoxamine maleate) 6mg  every 6-8 hours as needed.  For now, hold cetirizine/levocetirizine.  A [facility] been [facility], 2 actuations per nostril twice a day. Proper technique has been discussed and demonstrated.  For now, hold Nasacort AQ.  Nasal saline lavage (NeilMed) has been recommended as needed and prior to medicated nasal sprays along with instructions for proper administration.  The patient has been asked to contact me if her symptoms persist, progress, or if she becomes febrile.   Allergic rhinitis  Continue appropriate allergen avoidance measures.  Treatment plan as outlined above for acute sinusitis.  Food allergy  Continue strict avoidance of shellfish and have access to epinephrine autoinjector 2 pack in case of accidental ingestion.  Food allergy action plan is in place.   Meds ordered this encounter  Medications  . Fluticasone Propionate  (XHANCE) 93 MCG/ACT EXHU    Sig: Place 2 Act into the nose 2 (two) times daily.    Dispense:  16 mL    Refill:  5    [phone] (M)  . Carbinoxamine Maleate (RYVENT) 6 MG TABS    Sig: Take 6 mg by mouth every 6 (six) hours as needed.    Dispense:  30 tablet    Refill:  5    RUN AS CASH PAY ONLY! BIN  [id number]  RXPCN  [id number]  Group  [id number]  CardholderID  [id number], place on hold until patient needs it.    Diagnostics: Spirometry reveals an FVC of 2.62 L and an FEV1 of 2.35 L (80% predicted.  This FEV1 is consistent with her previous study.  Please see scanned spirometry results for details.    Physical examination: Blood pressure 106/72, pulse 88, temperature 97.9 F (36.6 C), resp. rate 17, height 5\' 1"  (1.549 m), weight 167 lb 6.4 oz (75.9 kg), SpO2 98 %, currently breastfeeding.  General: Alert, interactive, in no acute distress. HEENT: TMs pearly gray, turbinates edematous with thick discharge, post-pharynx erythematous. Neck: Supple without lymphadenopathy. Lungs: Clear to auscultation without wheezing, rhonchi or rales. CV: Normal S1, S2 without murmurs. Skin: Warm and dry, without lesions or rashes.  The following portions of the patient's history were reviewed and updated as appropriate: allergies, current medications, past family history, past medical history, past social history, past surgical history and problem list.  Allergies as of 01/19/2018      Reactions   Shellfish Allergy Anaphylaxis   Nuvaring [etonogestrel-ethinyl Estradiol] Hives      Medication List        Accurate as of 01/19/18  12:56 PM. Always use your most recent med list.          acetaminophen 500 MG tablet Commonly known as:  TYLENOL Take 500 mg by mouth every 8 (eight) hours as needed for mild pain or moderate pain.   albuterol 108 (90 Base) MCG/ACT inhaler Commonly known as:  PROVENTIL HFA;VENTOLIN HFA Inhale 1-2 puffs into the lungs every 6 (six) hours as needed for wheezing or  shortness of breath.   Carbinoxamine Maleate 6 MG Tabs Commonly known as:  RYVENT Take 6 mg by mouth every 6 (six) hours as needed.   cetirizine 10 MG tablet Commonly known as:  ZYRTEC Take 10 mg by mouth daily as needed for allergies.   EPINEPHrine 0.3 mg/0.3 mL Soaj injection Commonly known as:  EPI-PEN Inject into the muscle.   Fluticasone Propionate 93 MCG/ACT Exhu Commonly known as:  XHANCE Place 2 Act into the nose 2 (two) times daily.   ranitidine 150 MG tablet Commonly known as:  ZANTAC Take by mouth.   UNABLE TO FIND every 7 (seven) days. Allergy injection on Tuesday at Dr office   ZOVIA 1/35E (28) 1-35 MG-MCG tablet Generic drug:  ethynodiol-ethinyl estradiol Take 1 tablet by mouth daily.       Allergies  Allergen Reactions  . Shellfish Allergy Anaphylaxis  . Nuvaring [Etonogestrel-Ethinyl Estradiol] Hives   Review of systems: Review of systems negative except as noted in HPI / PMHx or noted below: Constitutional: Negative.  HENT: Negative.   Eyes: Negative.  Respiratory: Negative.   Cardiovascular: Negative.  Gastrointestinal: Negative.  Genitourinary: Negative.  Musculoskeletal: Negative.  Neurological: Negative.  Endo/Heme/Allergies: Negative.  Cutaneous: Negative.  Past Medical History:  Diagnosis Date  . Headache   . Hyperlipidemia   . Infertility management   . PCOS (polycystic ovarian syndrome)   . Plantar fasciitis 01/28/2014    Family History  Problem Relation Age of Onset  . Diabetes Mother   . Hyperlipidemia Mother   . Diabetes Father   . Hyperlipidemia Father   . Cancer Maternal Aunt        uterine/Ovarian    Social History   Socioeconomic History  . Marital status: Single    Spouse name: Not on file  . Number of children: 1  . Years of education: Not on file  . Highest education level: Not on file  Social Needs  . Financial resource strain: Not on file  . Food insecurity - worry: Not on file  . Food insecurity -  inability: Not on file  . Transportation needs - medical: Not on file  . Transportation needs - non-medical: Not on file  Occupational History  . Occupation: LPN  Tobacco Use  . Smoking status: Never Smoker  . Smokeless tobacco: Never Used  Substance and Sexual Activity  . Alcohol use: Yes    Comment: occ  . Drug use: No  . Sexual activity: Yes    Partners: Male    Birth control/protection: Pill  Other Topics Concern  . Not on file  Social History Narrative  . Not on file    I appreciate the opportunity to take part in Porshia's care. Please do not hesitate to contact me with questions.  Sincerely,   R. Jorene Guest, MD

## 2018-01-19 NOTE — Assessment & Plan Note (Signed)
   Continue appropriate allergen avoidance measures.  Treatment plan as outlined above for acute sinusitis. 

## 2018-01-19 NOTE — Patient Instructions (Addendum)
Acute sinusitis  Prednisone has been provided, 40 mg x3 days, 20 mg x1 day, 10 mg x1 day, then stop.  A prescription has been provided for RyVent (carbinoxamine maleate) 6mg  every 6-8 hours as needed.  For now, hold cetirizine/levocetirizine.  A prescription has been [facility], 2 actuations per nostril twice a day. Proper technique has been discussed and demonstrated.  For now, hold Nasacort AQ.  Nasal saline lavage (NeilMed) has been recommended as needed and prior to medicated nasal sprays along with instructions for proper administration.  The patient has been asked to contact me if her symptoms persist, progress, or if she becomes febrile.   Allergic rhinitis  Continue appropriate allergen avoidance measures.  Treatment plan as outlined above for acute sinusitis.  Food allergy  Continue strict avoidance of shellfish and have access to epinephrine autoinjector 2 pack in case of accidental ingestion.  Food allergy action plan is in place.   Return in about 6 months (around 07/22/2018), or if symptoms worsen or fail to improve.

## 2018-01-19 NOTE — Addendum Note (Signed)
Addended by: Dub MikesHICKS, Akira Adelsberger N on: 01/19/2018 01:58 PM   Modules accepted: Orders

## 2018-01-19 NOTE — Assessment & Plan Note (Addendum)
   Continue strict avoidance of shellfish and have access to epinephrine autoinjector 2 pack in case of accidental ingestion.  Food allergy action plan is in place. 

## 2018-01-19 NOTE — Assessment & Plan Note (Signed)
   Prednisone has been provided, 40 mg x3 days, 20 mg x1 day, 10 mg x1 day, then stop.  A prescription has been provided for RyVent (carbinoxamine maleate) 6mg  every 6-8 hours as needed.  For now, hold cetirizine/levocetirizine.  A prescription has been [facility], 2 actuations per nostril twice a day. Proper technique has been discussed and demonstrated.  For now, hold Nasacort AQ.  Nasal saline lavage (NeilMed) has been recommended as needed and prior to medicated nasal sprays along with instructions for proper administration.  The patient has been asked to contact me if her symptoms persist, progress, or if she becomes febrile.

## 2018-01-22 ENCOUNTER — Ambulatory Visit (INDEPENDENT_AMBULATORY_CARE_PROVIDER_SITE_OTHER): Payer: 59

## 2018-01-22 DIAGNOSIS — J309 Allergic rhinitis, unspecified: Secondary | ICD-10-CM

## 2018-01-28 ENCOUNTER — Ambulatory Visit (INDEPENDENT_AMBULATORY_CARE_PROVIDER_SITE_OTHER): Payer: 59

## 2018-01-28 DIAGNOSIS — J309 Allergic rhinitis, unspecified: Secondary | ICD-10-CM | POA: Diagnosis not present

## 2018-01-29 ENCOUNTER — Telehealth: Payer: Self-pay

## 2018-01-29 MED ORDER — CARBINOXAMINE MALEATE 6 MG PO TABS: 6.0000 mg | ORAL_TABLET | Freq: Four times a day (QID) | ORAL | 5 refills | Status: DC | PRN

## 2018-01-29 NOTE — Telephone Encounter (Signed)
Patient stated that Pharmacy said that the coupon only covers up to a certain amount of pills and not the whole 30 day supply. Patient stated that her medication is going to cost her $40 for a month supply. Will try and send it to another pharmacy to see if it will cover the whole 30 day supply.

## 2018-02-04 ENCOUNTER — Ambulatory Visit (INDEPENDENT_AMBULATORY_CARE_PROVIDER_SITE_OTHER): Payer: 59

## 2018-02-04 DIAGNOSIS — J309 Allergic rhinitis, unspecified: Secondary | ICD-10-CM | POA: Diagnosis not present

## 2018-02-05 ENCOUNTER — Telehealth: Payer: Self-pay

## 2018-02-05 MED ORDER — OLOPATADINE HCL 0.2 % OP SOLN: 1.0000 [drp] | OPHTHALMIC | 5 refills | Status: DC

## 2018-02-05 NOTE — Addendum Note (Signed)
Addended by: Dub MikesHICKS, Mckenlee Mangham N on: 02/05/2018 02:25 PM   Modules accepted: Orders

## 2018-02-05 NOTE — Telephone Encounter (Signed)
Patient called and stated that her eyes are itchy and watery and would like an eye drop. Please advise.

## 2018-02-05 NOTE — Telephone Encounter (Signed)
Called and spoke with patient and informed her that [facility]

## 2018-02-05 NOTE — Telephone Encounter (Signed)
Pazeo 1 drop per eye daily as needed.  If her insurance does not cover that, just go with Pataday, 1 drop per eye daily as needed, or Patanol, 1 drop per eye twice daily as needed.  Thanks.

## 2018-02-09 ENCOUNTER — Ambulatory Visit (INDEPENDENT_AMBULATORY_CARE_PROVIDER_SITE_OTHER): Payer: 59

## 2018-02-09 DIAGNOSIS — J309 Allergic rhinitis, unspecified: Secondary | ICD-10-CM

## 2018-02-17 ENCOUNTER — Other Ambulatory Visit: Payer: Self-pay

## 2018-02-17 MED ORDER — RYVENT 6 MG PO TABS: 6.0000 mg | ORAL_TABLET | Freq: Four times a day (QID) | ORAL | 5 refills | Status: DC | PRN

## 2018-02-18 ENCOUNTER — Ambulatory Visit (INDEPENDENT_AMBULATORY_CARE_PROVIDER_SITE_OTHER): Payer: 59

## 2018-02-18 DIAGNOSIS — J309 Allergic rhinitis, unspecified: Secondary | ICD-10-CM

## 2018-02-19 NOTE — Progress Notes (Signed)
VIAL EXP 02-21-19

## 2018-02-20 DIAGNOSIS — J3089 Other allergic rhinitis: Secondary | ICD-10-CM | POA: Diagnosis not present

## 2018-02-25 ENCOUNTER — Ambulatory Visit (INDEPENDENT_AMBULATORY_CARE_PROVIDER_SITE_OTHER): Payer: 59

## 2018-02-25 DIAGNOSIS — J309 Allergic rhinitis, unspecified: Secondary | ICD-10-CM | POA: Diagnosis not present

## 2018-03-04 ENCOUNTER — Ambulatory Visit (INDEPENDENT_AMBULATORY_CARE_PROVIDER_SITE_OTHER): Payer: 59

## 2018-03-04 DIAGNOSIS — J309 Allergic rhinitis, unspecified: Secondary | ICD-10-CM | POA: Diagnosis not present

## 2018-03-10 MED FILL — OLOPATADINE HCL 0.2% EYE DR: 0.2 | 25 days supply | Qty: 3 | Fill #0

## 2018-03-12 ENCOUNTER — Ambulatory Visit (INDEPENDENT_AMBULATORY_CARE_PROVIDER_SITE_OTHER): Payer: 59

## 2018-03-12 DIAGNOSIS — J309 Allergic rhinitis, unspecified: Secondary | ICD-10-CM

## 2018-03-18 ENCOUNTER — Ambulatory Visit (INDEPENDENT_AMBULATORY_CARE_PROVIDER_SITE_OTHER): Payer: 59 | Admitting: *Deleted

## 2018-03-18 DIAGNOSIS — J309 Allergic rhinitis, unspecified: Secondary | ICD-10-CM

## 2018-03-23 ENCOUNTER — Telehealth: Payer: Self-pay

## 2018-03-23 MED ORDER — OLOPATADINE HCL 0.7 % OP SOLN: 1.0000 [drp] | Freq: Every day | OPHTHALMIC | 5 refills | Status: DC

## 2018-03-23 NOTE — Telephone Encounter (Signed)
I spoke with patient and advised her of Dr. Sheran Fava recommendations. I will send in Pazeo and see about insurance coverage. If needed will submit PA. Patient said that if pa is needed send prescription to [facility].

## 2018-03-23 NOTE — Telephone Encounter (Signed)
Patient is having puffy swollen itchy eyes. She is taking ryvent, zyrtec, xhance and pataday eye drops.   Please Advise  [facility]

## 2018-03-23 NOTE — Telephone Encounter (Signed)
See if her insurance will cover Paseo, if so please call that in.  If not, have her grab some samples of Paseo.  In addition, she should be using natural tear eyedrops throughout the day as needed.

## 2018-03-23 NOTE — Telephone Encounter (Signed)
Attempted to submit prior authorization, but per [email or web address] member was not found. I will follow up tomorrow with patient in regards to updated insurance information.

## 2018-03-26 ENCOUNTER — Ambulatory Visit (INDEPENDENT_AMBULATORY_CARE_PROVIDER_SITE_OTHER): Payer: 59

## 2018-03-26 DIAGNOSIS — J309 Allergic rhinitis, unspecified: Secondary | ICD-10-CM | POA: Diagnosis not present

## 2018-04-02 ENCOUNTER — Ambulatory Visit (INDEPENDENT_AMBULATORY_CARE_PROVIDER_SITE_OTHER): Payer: 59 | Admitting: *Deleted

## 2018-04-02 DIAGNOSIS — J309 Allergic rhinitis, unspecified: Secondary | ICD-10-CM | POA: Diagnosis not present

## 2018-04-02 MED FILL — MEDROXYPROGESTERONE 10 MG T: 10 | 10 days supply | Qty: 10 | Fill #0

## 2018-04-08 DIAGNOSIS — R102 Pelvic and perineal pain: Secondary | ICD-10-CM | POA: Diagnosis not present

## 2018-04-08 DIAGNOSIS — N909 Noninflammatory disorder of vulva and perineum, unspecified: Secondary | ICD-10-CM | POA: Diagnosis not present

## 2018-04-13 ENCOUNTER — Ambulatory Visit (INDEPENDENT_AMBULATORY_CARE_PROVIDER_SITE_OTHER): Payer: 59

## 2018-04-13 DIAGNOSIS — J309 Allergic rhinitis, unspecified: Secondary | ICD-10-CM

## 2018-04-22 ENCOUNTER — Ambulatory Visit (INDEPENDENT_AMBULATORY_CARE_PROVIDER_SITE_OTHER): Payer: 59

## 2018-04-22 DIAGNOSIS — J309 Allergic rhinitis, unspecified: Secondary | ICD-10-CM

## 2018-04-27 DIAGNOSIS — M9902 Segmental and somatic dysfunction of thoracic region: Secondary | ICD-10-CM | POA: Diagnosis not present

## 2018-04-27 DIAGNOSIS — M5417 Radiculopathy, lumbosacral region: Secondary | ICD-10-CM | POA: Diagnosis not present

## 2018-04-27 DIAGNOSIS — M9903 Segmental and somatic dysfunction of lumbar region: Secondary | ICD-10-CM | POA: Diagnosis not present

## 2018-04-27 DIAGNOSIS — M62830 Muscle spasm of back: Secondary | ICD-10-CM | POA: Diagnosis not present

## 2018-04-27 DIAGNOSIS — M9904 Segmental and somatic dysfunction of sacral region: Secondary | ICD-10-CM | POA: Diagnosis not present

## 2018-04-29 ENCOUNTER — Ambulatory Visit (INDEPENDENT_AMBULATORY_CARE_PROVIDER_SITE_OTHER): Payer: 59

## 2018-04-29 DIAGNOSIS — M62830 Muscle spasm of back: Secondary | ICD-10-CM | POA: Diagnosis not present

## 2018-04-29 DIAGNOSIS — M9903 Segmental and somatic dysfunction of lumbar region: Secondary | ICD-10-CM | POA: Diagnosis not present

## 2018-04-29 DIAGNOSIS — M9904 Segmental and somatic dysfunction of sacral region: Secondary | ICD-10-CM | POA: Diagnosis not present

## 2018-04-29 DIAGNOSIS — M5417 Radiculopathy, lumbosacral region: Secondary | ICD-10-CM | POA: Diagnosis not present

## 2018-04-29 DIAGNOSIS — M9902 Segmental and somatic dysfunction of thoracic region: Secondary | ICD-10-CM | POA: Diagnosis not present

## 2018-04-29 DIAGNOSIS — J309 Allergic rhinitis, unspecified: Secondary | ICD-10-CM

## 2018-05-01 DIAGNOSIS — M9904 Segmental and somatic dysfunction of sacral region: Secondary | ICD-10-CM | POA: Diagnosis not present

## 2018-05-01 DIAGNOSIS — M9903 Segmental and somatic dysfunction of lumbar region: Secondary | ICD-10-CM | POA: Diagnosis not present

## 2018-05-01 DIAGNOSIS — M62830 Muscle spasm of back: Secondary | ICD-10-CM | POA: Diagnosis not present

## 2018-05-01 DIAGNOSIS — M9902 Segmental and somatic dysfunction of thoracic region: Secondary | ICD-10-CM | POA: Diagnosis not present

## 2018-05-01 DIAGNOSIS — M5417 Radiculopathy, lumbosacral region: Secondary | ICD-10-CM | POA: Diagnosis not present

## 2018-05-12 ENCOUNTER — Ambulatory Visit (INDEPENDENT_AMBULATORY_CARE_PROVIDER_SITE_OTHER): Payer: 59 | Admitting: *Deleted

## 2018-05-12 DIAGNOSIS — J309 Allergic rhinitis, unspecified: Secondary | ICD-10-CM | POA: Diagnosis not present

## 2018-05-13 DIAGNOSIS — M62830 Muscle spasm of back: Secondary | ICD-10-CM | POA: Diagnosis not present

## 2018-05-13 DIAGNOSIS — M9903 Segmental and somatic dysfunction of lumbar region: Secondary | ICD-10-CM | POA: Diagnosis not present

## 2018-05-13 DIAGNOSIS — M5417 Radiculopathy, lumbosacral region: Secondary | ICD-10-CM | POA: Diagnosis not present

## 2018-05-13 DIAGNOSIS — M9904 Segmental and somatic dysfunction of sacral region: Secondary | ICD-10-CM | POA: Diagnosis not present

## 2018-05-13 DIAGNOSIS — M9902 Segmental and somatic dysfunction of thoracic region: Secondary | ICD-10-CM | POA: Diagnosis not present

## 2018-05-15 ENCOUNTER — Ambulatory Visit: Payer: Self-pay | Admitting: *Deleted

## 2018-05-15 ENCOUNTER — Encounter: Payer: Self-pay | Admitting: *Deleted

## 2018-05-15 DIAGNOSIS — J301 Allergic rhinitis due to pollen: Secondary | ICD-10-CM | POA: Diagnosis not present

## 2018-05-15 NOTE — Progress Notes (Signed)
Maintenance vial made. Exp: 05-16-19. hv 

## 2018-05-20 ENCOUNTER — Ambulatory Visit (INDEPENDENT_AMBULATORY_CARE_PROVIDER_SITE_OTHER): Payer: 59

## 2018-05-20 DIAGNOSIS — J309 Allergic rhinitis, unspecified: Secondary | ICD-10-CM | POA: Diagnosis not present

## 2018-05-21 ENCOUNTER — Telehealth: Payer: Self-pay

## 2018-05-21 ENCOUNTER — Other Ambulatory Visit: Payer: Self-pay | Admitting: *Deleted

## 2018-05-21 MED ORDER — EPINEPHRINE 0.3 MG/0.3ML IJ SOAJ: 0.3000 mg | Freq: Once | INTRAMUSCULAR | 2 refills | Status: AC

## 2018-05-21 NOTE — Telephone Encounter (Signed)
A prescription has been sent in

## 2018-05-21 NOTE — Telephone Encounter (Signed)
yes

## 2018-05-21 NOTE — Telephone Encounter (Signed)
Patient is requesting refill on epinephrine device. May we send in the Auvi-q 0.3 mg instead of Mylan. Please advise and thank you.

## 2018-05-25 ENCOUNTER — Ambulatory Visit (INDEPENDENT_AMBULATORY_CARE_PROVIDER_SITE_OTHER): Payer: 59

## 2018-05-25 DIAGNOSIS — J309 Allergic rhinitis, unspecified: Secondary | ICD-10-CM

## 2018-05-26 MED FILL — MEDROXYPROGESTERONE 10 MG T: 10 | 10 days supply | Qty: 10 | Fill #0

## 2018-06-03 ENCOUNTER — Ambulatory Visit (INDEPENDENT_AMBULATORY_CARE_PROVIDER_SITE_OTHER): Payer: 59

## 2018-06-03 DIAGNOSIS — M9904 Segmental and somatic dysfunction of sacral region: Secondary | ICD-10-CM | POA: Diagnosis not present

## 2018-06-03 DIAGNOSIS — J309 Allergic rhinitis, unspecified: Secondary | ICD-10-CM | POA: Diagnosis not present

## 2018-06-03 DIAGNOSIS — M5417 Radiculopathy, lumbosacral region: Secondary | ICD-10-CM | POA: Diagnosis not present

## 2018-06-03 DIAGNOSIS — M62830 Muscle spasm of back: Secondary | ICD-10-CM | POA: Diagnosis not present

## 2018-06-03 DIAGNOSIS — M9902 Segmental and somatic dysfunction of thoracic region: Secondary | ICD-10-CM | POA: Diagnosis not present

## 2018-06-03 DIAGNOSIS — M9903 Segmental and somatic dysfunction of lumbar region: Secondary | ICD-10-CM | POA: Diagnosis not present

## 2018-06-04 DIAGNOSIS — M62830 Muscle spasm of back: Secondary | ICD-10-CM | POA: Diagnosis not present

## 2018-06-04 DIAGNOSIS — M5417 Radiculopathy, lumbosacral region: Secondary | ICD-10-CM | POA: Diagnosis not present

## 2018-06-04 DIAGNOSIS — M9902 Segmental and somatic dysfunction of thoracic region: Secondary | ICD-10-CM | POA: Diagnosis not present

## 2018-06-04 DIAGNOSIS — M9903 Segmental and somatic dysfunction of lumbar region: Secondary | ICD-10-CM | POA: Diagnosis not present

## 2018-06-04 DIAGNOSIS — M9904 Segmental and somatic dysfunction of sacral region: Secondary | ICD-10-CM | POA: Diagnosis not present

## 2018-06-10 ENCOUNTER — Ambulatory Visit (INDEPENDENT_AMBULATORY_CARE_PROVIDER_SITE_OTHER): Payer: 59

## 2018-06-10 DIAGNOSIS — J309 Allergic rhinitis, unspecified: Secondary | ICD-10-CM | POA: Diagnosis not present

## 2018-06-16 ENCOUNTER — Ambulatory Visit (INDEPENDENT_AMBULATORY_CARE_PROVIDER_SITE_OTHER): Payer: 59 | Admitting: *Deleted

## 2018-06-16 DIAGNOSIS — J309 Allergic rhinitis, unspecified: Secondary | ICD-10-CM

## 2018-06-24 ENCOUNTER — Ambulatory Visit (INDEPENDENT_AMBULATORY_CARE_PROVIDER_SITE_OTHER): Payer: 59

## 2018-06-24 DIAGNOSIS — J309 Allergic rhinitis, unspecified: Secondary | ICD-10-CM

## 2018-07-01 ENCOUNTER — Ambulatory Visit (INDEPENDENT_AMBULATORY_CARE_PROVIDER_SITE_OTHER): Payer: 59

## 2018-07-01 DIAGNOSIS — J309 Allergic rhinitis, unspecified: Secondary | ICD-10-CM | POA: Diagnosis not present

## 2018-07-08 ENCOUNTER — Telehealth: Payer: Self-pay

## 2018-07-08 ENCOUNTER — Ambulatory Visit (INDEPENDENT_AMBULATORY_CARE_PROVIDER_SITE_OTHER): Payer: 59

## 2018-07-08 DIAGNOSIS — J309 Allergic rhinitis, unspecified: Secondary | ICD-10-CM

## 2018-07-08 NOTE — Telephone Encounter (Signed)
Drop the pollen/mite vial back to 0.2cc and hold there until the first hard freeze, then if there are no problems may resume build up on sched A. Thanks.

## 2018-07-08 NOTE — Telephone Encounter (Signed)
Patient informed me that after her injection her throat started itching and when she would swallow she felt like she had some thick mucus and couldn't take a breath. Per Dr. Dellis Anes patient took 4 puffs of her albuterol and an extra dose of Ryvent. Patient received 0.35 of her grass-weed-tree-mite with a reaction of 4+ and 0.5 of her mold-cat-cr with no reaction. Before patient left she stated her symptoms were improving.

## 2018-07-09 NOTE — Telephone Encounter (Signed)
Patient aware and notation made.

## 2018-07-15 ENCOUNTER — Ambulatory Visit: Payer: Self-pay

## 2018-07-15 ENCOUNTER — Ambulatory Visit (INDEPENDENT_AMBULATORY_CARE_PROVIDER_SITE_OTHER): Payer: 59

## 2018-07-15 DIAGNOSIS — J309 Allergic rhinitis, unspecified: Secondary | ICD-10-CM

## 2018-07-15 DIAGNOSIS — J3089 Other allergic rhinitis: Secondary | ICD-10-CM | POA: Diagnosis not present

## 2018-07-22 ENCOUNTER — Ambulatory Visit (INDEPENDENT_AMBULATORY_CARE_PROVIDER_SITE_OTHER): Payer: 59

## 2018-07-22 DIAGNOSIS — J309 Allergic rhinitis, unspecified: Secondary | ICD-10-CM

## 2018-07-27 MED FILL — MEDROXYPROGESTERONE 10 MG T: 10 | 10 days supply | Qty: 10 | Fill #0

## 2018-07-27 MED FILL — LETROZOLE 2.5 MG TABLET: 2.5 | 5 days supply | Qty: 15 | Fill #0

## 2018-07-29 ENCOUNTER — Ambulatory Visit (INDEPENDENT_AMBULATORY_CARE_PROVIDER_SITE_OTHER): Payer: 59

## 2018-07-29 DIAGNOSIS — J309 Allergic rhinitis, unspecified: Secondary | ICD-10-CM | POA: Diagnosis not present

## 2018-08-03 ENCOUNTER — Other Ambulatory Visit: Payer: Self-pay | Admitting: *Deleted

## 2018-08-03 ENCOUNTER — Ambulatory Visit (INDEPENDENT_AMBULATORY_CARE_PROVIDER_SITE_OTHER): Payer: 59 | Admitting: *Deleted

## 2018-08-03 DIAGNOSIS — J309 Allergic rhinitis, unspecified: Secondary | ICD-10-CM

## 2018-08-03 DIAGNOSIS — Z578 Occupational exposure to other risk factors: Secondary | ICD-10-CM

## 2018-08-17 DIAGNOSIS — N925 Other specified irregular menstruation: Secondary | ICD-10-CM | POA: Diagnosis not present

## 2018-08-18 ENCOUNTER — Telehealth: Payer: Self-pay | Admitting: Allergy & Immunology

## 2018-08-18 MED ORDER — BENZONATATE 100 MG PO CAPS: 100.0000 mg | ORAL_CAPSULE | Freq: Three times a day (TID) | ORAL | 2 refills | Status: DC | PRN

## 2018-08-18 MED FILL — METFORMIN HCL ER 750 MG TAB: 750 | 30 days supply | Qty: 30 | Fill #0

## 2018-08-18 MED FILL — BENZONATATE 100 MG CAPS: 100 | 7 days supply | Qty: 20 | Fill #0

## 2018-08-18 NOTE — Telephone Encounter (Signed)
Dawn Wu is complaining about some chest tightness, low grade fever, and nasal congestion of two days duration. I listened to her and she does not sound wheezy at all. I will send in Tessalon pearls for her to add to her Delsum and Mucinex.   [name], MD [facility]

## 2018-08-28 DIAGNOSIS — E669 Obesity, unspecified: Secondary | ICD-10-CM | POA: Diagnosis not present

## 2018-08-28 DIAGNOSIS — R635 Abnormal weight gain: Secondary | ICD-10-CM | POA: Diagnosis not present

## 2018-08-28 DIAGNOSIS — R5383 Other fatigue: Secondary | ICD-10-CM | POA: Diagnosis not present

## 2018-08-28 DIAGNOSIS — Z0000 Encounter for general adult medical examination without abnormal findings: Secondary | ICD-10-CM | POA: Diagnosis not present

## 2018-08-31 ENCOUNTER — Ambulatory Visit (INDEPENDENT_AMBULATORY_CARE_PROVIDER_SITE_OTHER): Payer: 59

## 2018-08-31 DIAGNOSIS — J309 Allergic rhinitis, unspecified: Secondary | ICD-10-CM | POA: Diagnosis not present

## 2018-09-07 ENCOUNTER — Ambulatory Visit (INDEPENDENT_AMBULATORY_CARE_PROVIDER_SITE_OTHER): Payer: 59

## 2018-09-07 DIAGNOSIS — J309 Allergic rhinitis, unspecified: Secondary | ICD-10-CM

## 2018-09-09 DIAGNOSIS — J3089 Other allergic rhinitis: Secondary | ICD-10-CM | POA: Diagnosis not present

## 2018-09-14 MED FILL — METFORMIN HCL ER 750 MG TAB: 750 | 30 days supply | Qty: 30 | Fill #1

## 2018-09-16 ENCOUNTER — Encounter (INDEPENDENT_AMBULATORY_CARE_PROVIDER_SITE_OTHER): Payer: Self-pay | Admitting: Family Medicine

## 2018-09-16 ENCOUNTER — Ambulatory Visit (INDEPENDENT_AMBULATORY_CARE_PROVIDER_SITE_OTHER): Payer: 59

## 2018-09-16 ENCOUNTER — Ambulatory Visit (INDEPENDENT_AMBULATORY_CARE_PROVIDER_SITE_OTHER): Payer: 59 | Admitting: Family Medicine

## 2018-09-16 DIAGNOSIS — J309 Allergic rhinitis, unspecified: Secondary | ICD-10-CM | POA: Diagnosis not present

## 2018-09-16 DIAGNOSIS — M79644 Pain in right finger(s): Secondary | ICD-10-CM | POA: Diagnosis not present

## 2018-09-16 DIAGNOSIS — N925 Other specified irregular menstruation: Secondary | ICD-10-CM | POA: Diagnosis not present

## 2018-09-16 MED ORDER — DICLOFENAC SODIUM 2 % TD SOLN: 1.0000 "application " | Freq: Two times a day (BID) | TRANSDERMAL | 3 refills | Status: DC | PRN

## 2018-09-16 NOTE — Progress Notes (Signed)
   Office Visit Note   Patient: Dawn Wu           Date of Birth: [birth year]           MRN: [id number] Visit Date: 09/16/2018 Requested by: Reynaldo Min[place], [place], [place] [address] [place], [place] [id number] PCP: Reynaldo Min[place], [place], [place]  Subjective: Chief Complaint  Patient presents with  . Right Index Finger - Pain    Pain at base of finger (palmar aspect) x 1 & 1/2 months ago.  NKI.    HPI: She is a 32 year old right-hand-dominant female with right hand pain.  For the past month she has had pain in her index finger near the MCP joint on the palm side of her hand.  Denies any trauma.  No numbness in her finger, no locking.  It has become swollen and red at times.  Ibuprofen occasionally gives her some relief, but not complete relief.  No previous problems with her hand.              ROS: She is otherwise in good health, review of systems noncontributory  Objective: Vital Signs: There were no vitals taken for this visit.  Physical Exam:  Right hand: There is a tender nodule near the A1 pulley of her index finger.  No triggering, full range of motion.  Mild pain with flexion of the finger against resistance.  Imaging: Ultrasound images over the nodule reveal a small ganglion cyst near the A1 pulley superficially.  The underlying flexor tendons appear normal.  Assessment & Plan: 1.  Right right hand pain due to small ganglion cyst near the index anger A1 pulley -Discussed options with her and elected to try topical diclofenac, ice applications.  She will do this for a couple weeks and if symptoms are not improving we could consider injection or possibly occupational/hand therapy referral.   Follow-Up Instructions: No follow-ups on file.      Procedures: No procedures performed  No notes on file    PMFS History: Patient Active Problem List   Diagnosis Date Noted  . Acute sinusitis 01/19/2018  . Chest tightness/dyspnea 11/25/2016  . Allergic  rhinitis 11/25/2016  . Food allergy 11/25/2016  . s/p vaginal delivery 01/22/2016  . Atypical squamous cells of undetermined significance (ASCUS) on Papanicolaou smear of cervix 01/22/2016  . Class 1 obesity 04/07/2014   Past Medical History:  Diagnosis Date  . Headache   . Hyperlipidemia   . Infertility management   . PCOS (polycystic ovarian syndrome)   . Plantar fasciitis 01/28/2014    Family History  Problem Relation Age of Onset  . Diabetes Mother   . Hyperlipidemia Mother   . Diabetes Father   . Hyperlipidemia Father   . Cancer Maternal Aunt        uterine/Ovarian    Past Surgical History:  Procedure Laterality Date  . CHOLECYSTECTOMY N/A 06/24/2017   Procedure: LAPAROSCOPIC CHOLECYSTECTOMY;  Surgeon: Berna Bueonnor, Chelsea A, MD;  Location: [facility];  Service: General;  Laterality: N/A;  . WISDOM TOOTH EXTRACTION     Social History   Occupational History  . Occupation: LPN  Tobacco Use  . Smoking status: Never Smoker  . Smokeless tobacco: Never Used  Substance and Sexual Activity  . Alcohol use: Yes    Comment: occ  . Drug use: No  . Sexual activity: Yes    Partners: Male    Birth control/protection: Pill

## 2018-09-23 ENCOUNTER — Ambulatory Visit (INDEPENDENT_AMBULATORY_CARE_PROVIDER_SITE_OTHER): Payer: 59 | Admitting: *Deleted

## 2018-09-23 DIAGNOSIS — J309 Allergic rhinitis, unspecified: Secondary | ICD-10-CM | POA: Diagnosis not present

## 2018-09-24 IMAGING — CT CT ABD-PELV W/ CM
2 of 4 series · 15 of 46 positions shown, 17 images · IV contrast (APPLIED)
Comparison: None.

CLINICAL DATA: Right upper quadrant pain.  Nausea and vomiting.

EXAM:
CT ABDOMEN AND PELVIS WITH CONTRAST
TECHNIQUE: Multidetector CT imaging of the abdomen and pelvis was performed
using the standard protocol following bolus administration of
intravenous contrast.
CONTRAST:  100mL H3VP1Q-FFF IOPAMIDOL (H3VP1Q-FFF) INJECTION 61%

[Series 3: axial st · axial · 0.80mm/px · z∈[-457,+18]mm · 12 of 105 slices shown, 14 images]
[im 5/105  soft-tissue]
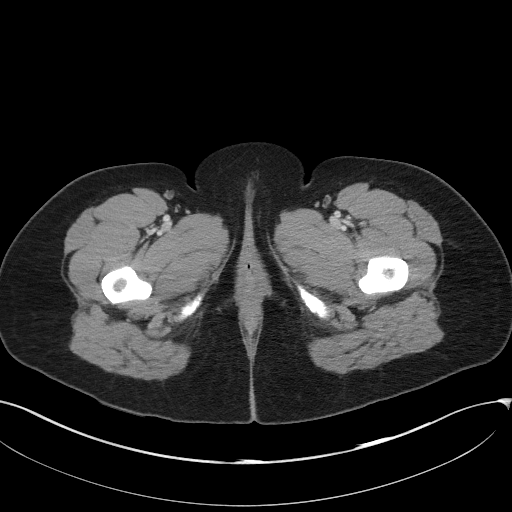
[im 5/105  bone]
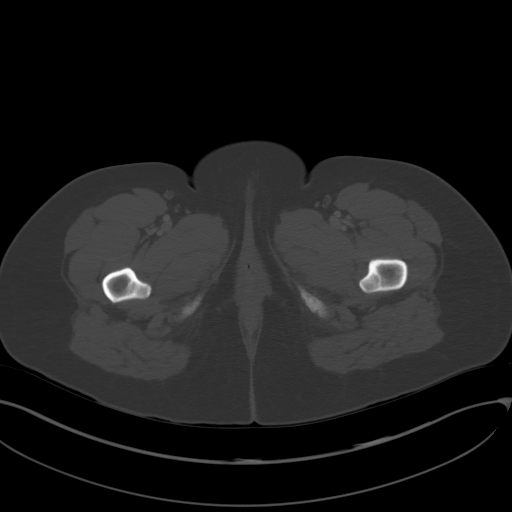
[im 14/105  soft-tissue]
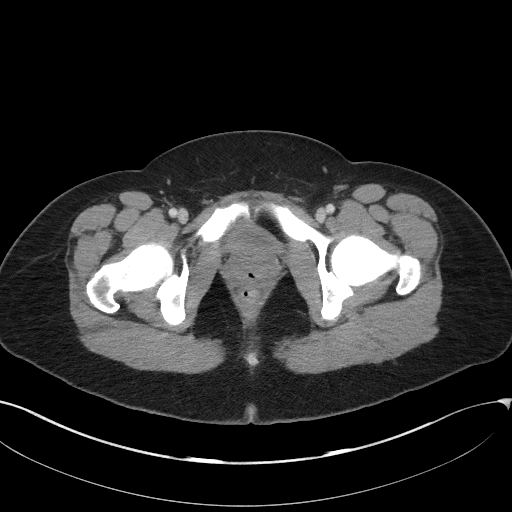
[im 22/105  soft-tissue]
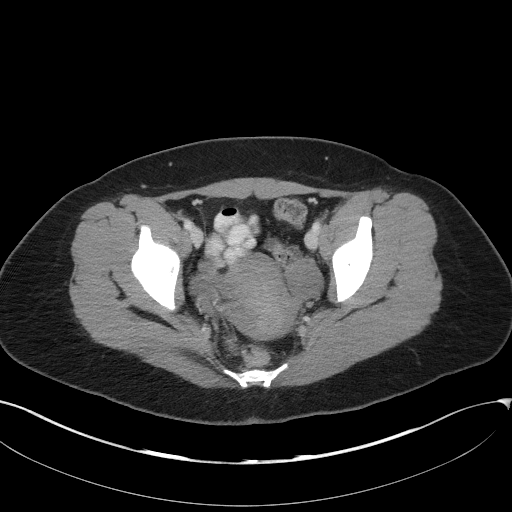
[im 31/105  soft-tissue]
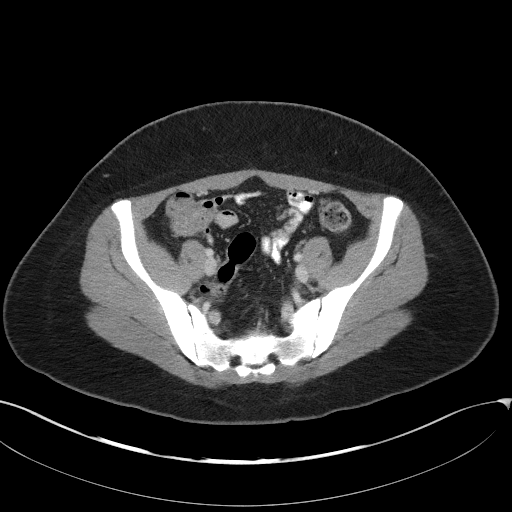
[im 40/105  soft-tissue]
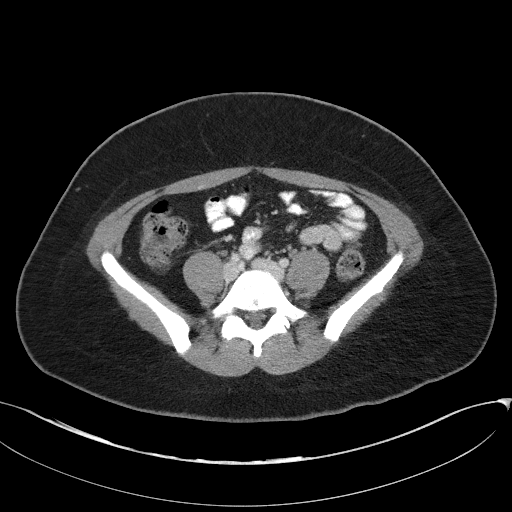
[im 48/105  soft-tissue]
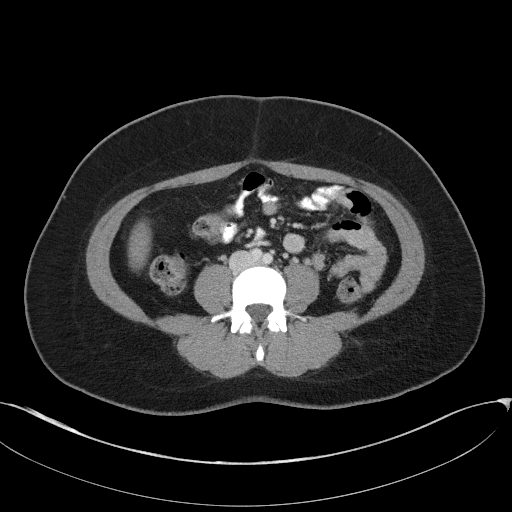
[im 57/105  soft-tissue]
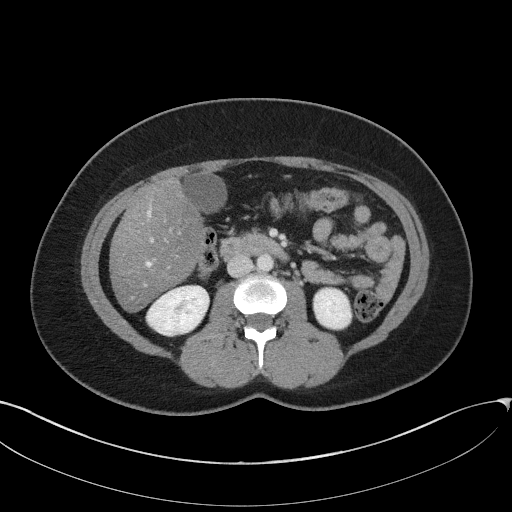
[im 66/105  soft-tissue]
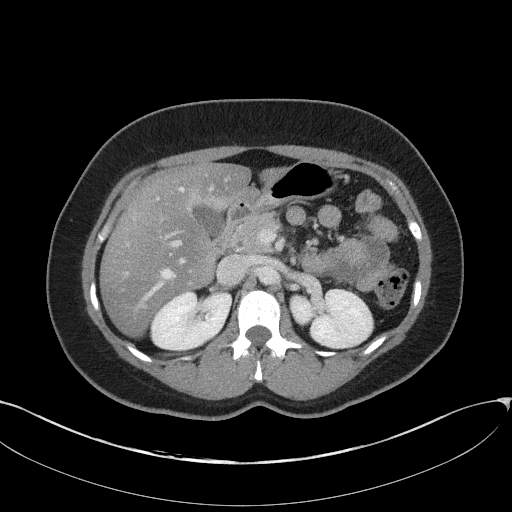
[im 74/105  soft-tissue]
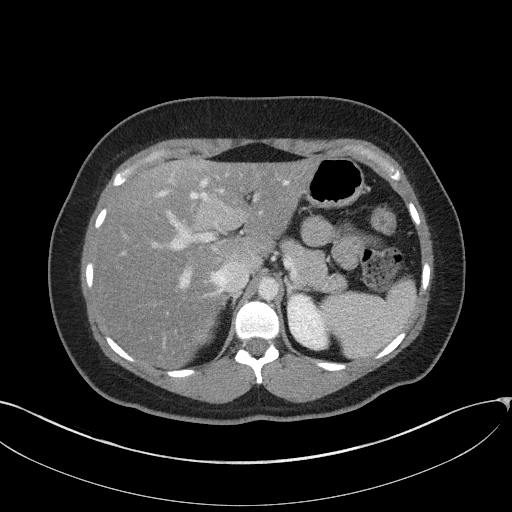
[im 74/105  bone]
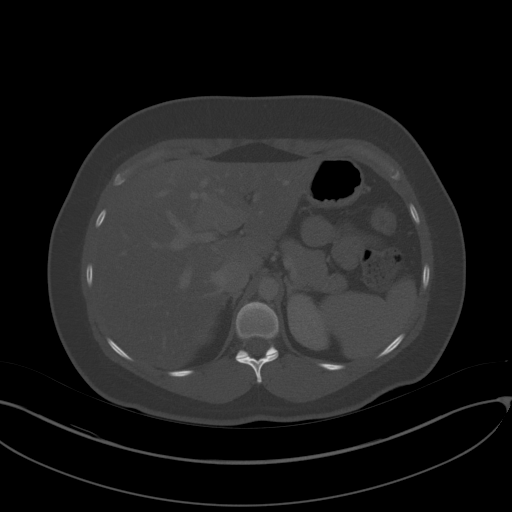
[im 83/105  soft-tissue]
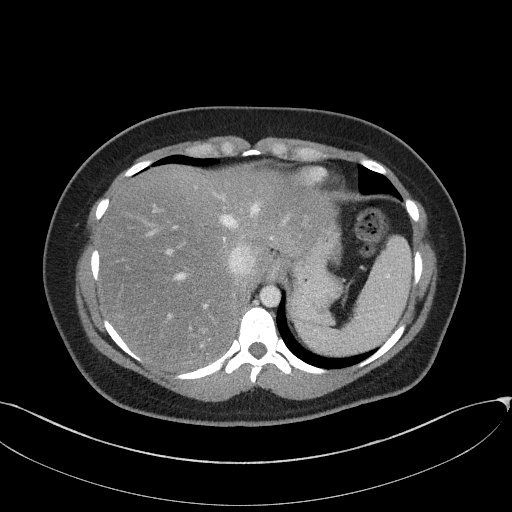
[im 92/105  soft-tissue]
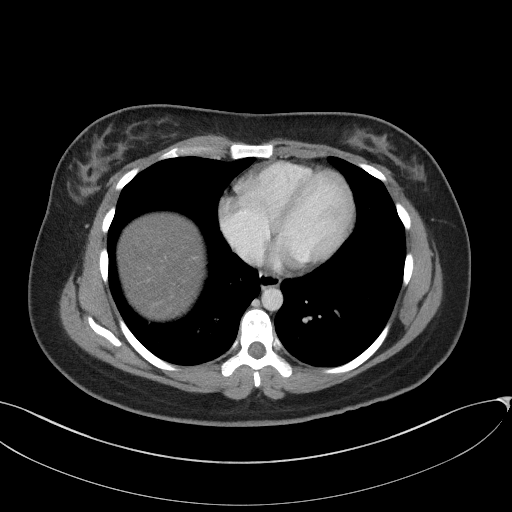
[im 100/105  soft-tissue]
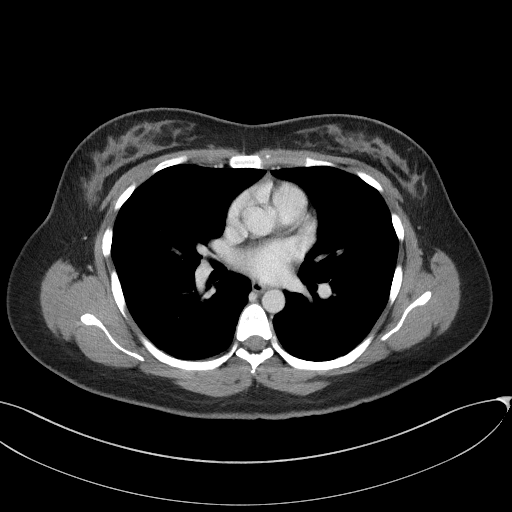

[Series 6: coronal st · coronal · 0.74mm/px · 3 of 74 slices shown]
[im 25/74  soft-tissue]
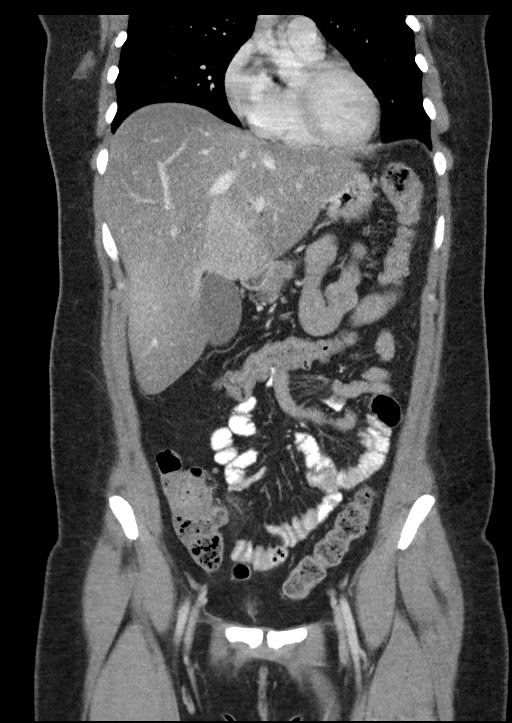
[im 33/74  soft-tissue]
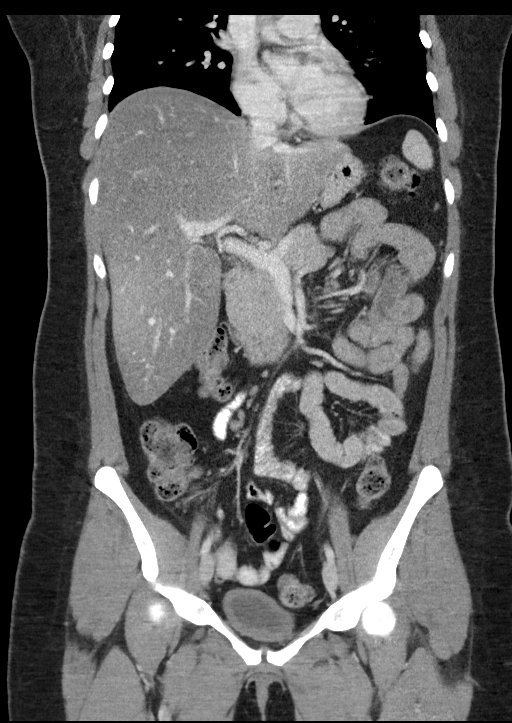
[im 41/74  soft-tissue]
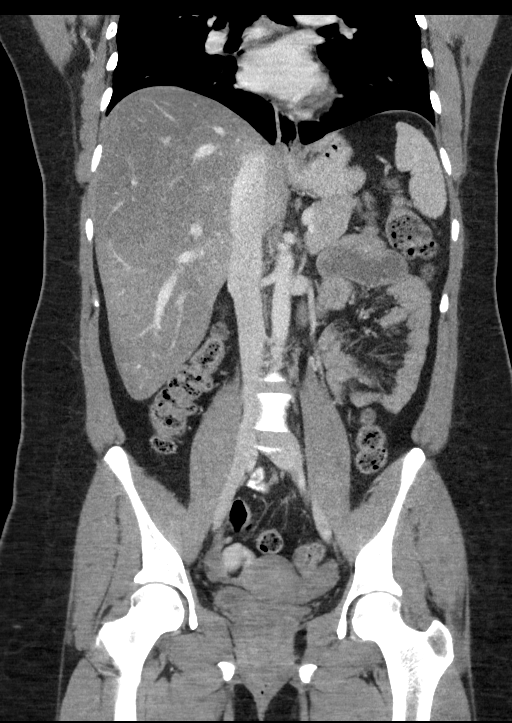

[15 of 46 positions shown; findings below may reference images not displayed]

FINDINGS: Lower chest: The lung bases are clear.

Hepatobiliary: The liver is enlarged spanning 24 cm cranial caudal.
There is marked decreased density consistent with steatosis.
Probable focal fatty sparing adjacent to the gallbladder fossa false
form ligament. Gallbladder physiologically distended, no calcified
stone. No biliary dilatation.

Pancreas: No ductal dilatation or inflammation.

Spleen: Normal in size without focal abnormality.

Adrenals/Urinary Tract: Normal adrenal glands. No hydronephrosis or
perinephric edema. No focal renal abnormality. Ureters are
decompressed. Urinary bladder is nondistended and not well evaluated

Stomach/Bowel: The stomach decompressed. No bowel obstruction, wall
thickening or inflammation. Normal appendix. Moderate colonic stool
burden. No colonic wall thickening.

Vascular/Lymphatic: No significant vascular findings are present. No
enlarged abdominal or pelvic lymph nodes. Multiple small ileocolic
nodes are not enlarged by size criteria.

Reproductive: Uterus and bilateral adnexa are unremarkable. The
ovaries are symmetric in size.

Other: No free air, free fluid, or intra-abdominal fluid collection.

Musculoskeletal: There are no acute or suspicious osseous
abnormalities.
IMPRESSION: 1. Hepatomegaly with hepatic steatosis and focal fatty sparing
adjacent to the gallbladder fossa and falciform ligament.
Hepatomegaly and hepatic capsular stretching may be cause of right
upper quadrant pain.
2. Normal CT appearance of gallbladder. No pericholecystic
inflammation.

## 2018-10-05 ENCOUNTER — Ambulatory Visit (INDEPENDENT_AMBULATORY_CARE_PROVIDER_SITE_OTHER): Payer: 59 | Admitting: *Deleted

## 2018-10-05 DIAGNOSIS — J309 Allergic rhinitis, unspecified: Secondary | ICD-10-CM | POA: Diagnosis not present

## 2018-10-07 MED FILL — LETROZOLE 2.5 MG TAB: 2.5 | 5 days supply | Qty: 15 | Fill #0

## 2018-10-08 MED FILL — METFORMIN HCL ER 750 MG TAB: 750 | 30 days supply | Qty: 30 | Fill #2

## 2018-10-12 ENCOUNTER — Ambulatory Visit (INDEPENDENT_AMBULATORY_CARE_PROVIDER_SITE_OTHER): Payer: 59

## 2018-10-12 DIAGNOSIS — J309 Allergic rhinitis, unspecified: Secondary | ICD-10-CM

## 2018-10-19 DIAGNOSIS — N925 Other specified irregular menstruation: Secondary | ICD-10-CM | POA: Diagnosis not present

## 2018-10-22 ENCOUNTER — Ambulatory Visit (INDEPENDENT_AMBULATORY_CARE_PROVIDER_SITE_OTHER): Payer: 59

## 2018-10-22 DIAGNOSIS — J309 Allergic rhinitis, unspecified: Secondary | ICD-10-CM

## 2018-10-30 ENCOUNTER — Ambulatory Visit (INDEPENDENT_AMBULATORY_CARE_PROVIDER_SITE_OTHER): Payer: 59

## 2018-10-30 ENCOUNTER — Telehealth: Payer: Self-pay

## 2018-10-30 DIAGNOSIS — J309 Allergic rhinitis, unspecified: Secondary | ICD-10-CM | POA: Diagnosis not present

## 2018-10-30 NOTE — Telephone Encounter (Signed)
Patient has been having problems with her Grass-Tree-Weed-DM vial. She was frozen but has since starting advancing and has been having reactions. She has even been doubling up on her antihistamines on injection days. It seems like 0.25 is the dose she tolerates well. Patient received 0.25 ml today with no problems.

## 2018-11-02 NOTE — Telephone Encounter (Signed)
I have added these notations to the patients immunotherapy flowsheet

## 2018-11-02 NOTE — Telephone Encounter (Signed)
Stay at 0.25cc for now.

## 2018-11-03 DIAGNOSIS — N926 Irregular menstruation, unspecified: Secondary | ICD-10-CM | POA: Diagnosis not present

## 2018-11-03 DIAGNOSIS — N925 Other specified irregular menstruation: Secondary | ICD-10-CM | POA: Diagnosis not present

## 2018-11-03 NOTE — Telephone Encounter (Signed)
Noted thank you

## 2018-11-04 NOTE — L&D Delivery Note (Signed)
LABOR COURSE Patient was admitted for IOL for Ologihydramnios and BPP 4/10. She received two doses of Cytotec and had a Cook Catheter placed. She was later augmented with Pitocin and AROM.  Delivery Note Called to room and patient was complete with urge to push. Head delivered ROT. No nuchal cord present. Shoulder and body delivered in usual fashion. At 0516 a viable female was delivered via Vaginal, Spontaneous (Presentation:ROT ; ROA).  Infant with spontaneous cry, placed on mother's abdomen, dried and stimulated. Cord clamped x 2 after one-minute delay, and cut by [name] "JJ". Cord blood drawn. Placenta delivered spontaneously with gentle cord traction. Appears intact. Fundus firm with massage and Pitocin. Labia, perineum, vagina, and cervix inspected with laceration as noted below:   APGAR:8,9; weight 2515g .   Cord: 3VC with the following complications:none.   Cord pH: not indicated  Anesthesia:  Epidural Episiotomy: None Lacerations: Superficial shallow 1st degree perineal, not hemostatic following pressure, repaired Suture Repair: 3.0 Monocryl  Q Blood Loss (mL): 275  Mom to postpartum.  Baby to Couplet care / Skin to Skin.   Follow up Visit:  Please schedule this patient for Postpartum visit in: 4 weeks with the following provider: Any provider For C/S patients schedule nurse incision check in weeks 2 weeks: no High risk pregnancy complicated by: GDM Delivery mode: SVD Anticipated Birth Control: does not desire contraception PP Procedures needed: 2 hour GTT  Schedule Integrated BH visit: no  Mallie Snooks, CNM 06/17/19 7:32 AM

## 2018-11-09 MED FILL — metFORMIN HCL ER 750 MG TB2: 750 | 30 days supply | Qty: 30 | Fill #3

## 2018-11-11 ENCOUNTER — Ambulatory Visit (INDEPENDENT_AMBULATORY_CARE_PROVIDER_SITE_OTHER): Payer: 59

## 2018-11-11 DIAGNOSIS — J309 Allergic rhinitis, unspecified: Secondary | ICD-10-CM | POA: Diagnosis not present

## 2018-11-11 DIAGNOSIS — Z3201 Encounter for pregnancy test, result positive: Secondary | ICD-10-CM | POA: Diagnosis not present

## 2018-11-13 DIAGNOSIS — Z3201 Encounter for pregnancy test, result positive: Secondary | ICD-10-CM | POA: Diagnosis not present

## 2018-11-17 ENCOUNTER — Ambulatory Visit (INDEPENDENT_AMBULATORY_CARE_PROVIDER_SITE_OTHER): Payer: 59

## 2018-11-17 DIAGNOSIS — J309 Allergic rhinitis, unspecified: Secondary | ICD-10-CM

## 2018-11-19 ENCOUNTER — Other Ambulatory Visit: Payer: Self-pay

## 2018-11-19 MED ORDER — ALBUTEROL SULFATE HFA 108 (90 BASE) MCG/ACT IN AERS: 1.0000 | INHALATION_SPRAY | Freq: Four times a day (QID) | RESPIRATORY_TRACT | 0 refills | Status: DC | PRN

## 2018-11-19 MED ORDER — EPINEPHRINE 0.3 MG/0.3ML IJ SOAJ: 0.3000 mg | Freq: Once | INTRAMUSCULAR | 2 refills | Status: AC

## 2018-11-19 MED FILL — VENTOLIN HFA 90 MCG INHALER: 108 (90 BAS | 25 days supply | Qty: 18 | Fill #0

## 2018-11-19 MED FILL — EPINEPHRINE 0.3 MG AUTO-INJ: 0.3 | 30 days supply | Qty: 2 | Fill #0

## 2018-11-25 ENCOUNTER — Ambulatory Visit (INDEPENDENT_AMBULATORY_CARE_PROVIDER_SITE_OTHER): Payer: 59

## 2018-11-25 DIAGNOSIS — J309 Allergic rhinitis, unspecified: Secondary | ICD-10-CM | POA: Diagnosis not present

## 2018-11-25 DIAGNOSIS — Z3687 Encounter for antenatal screening for uncertain dates: Secondary | ICD-10-CM | POA: Diagnosis not present

## 2018-12-02 ENCOUNTER — Ambulatory Visit (INDEPENDENT_AMBULATORY_CARE_PROVIDER_SITE_OTHER): Payer: 59

## 2018-12-02 DIAGNOSIS — J309 Allergic rhinitis, unspecified: Secondary | ICD-10-CM | POA: Diagnosis not present

## 2018-12-04 DIAGNOSIS — Z3481 Encounter for supervision of other normal pregnancy, first trimester: Secondary | ICD-10-CM | POA: Diagnosis not present

## 2018-12-04 DIAGNOSIS — O99211 Obesity complicating pregnancy, first trimester: Secondary | ICD-10-CM | POA: Diagnosis not present

## 2018-12-09 IMAGING — US US ABDOMEN COMPLETE
1 series · 14 of 25 positions shown · non-contrast
Comparison: CT 05/28/2017

CLINICAL DATA: Right upper quadrant pain

EXAM:
ABDOMEN ULTRASOUND COMPLETE

[Series 1: us abdomen complete · 0.22mm/px · 14 of 94 slices shown]
[im 1/94]
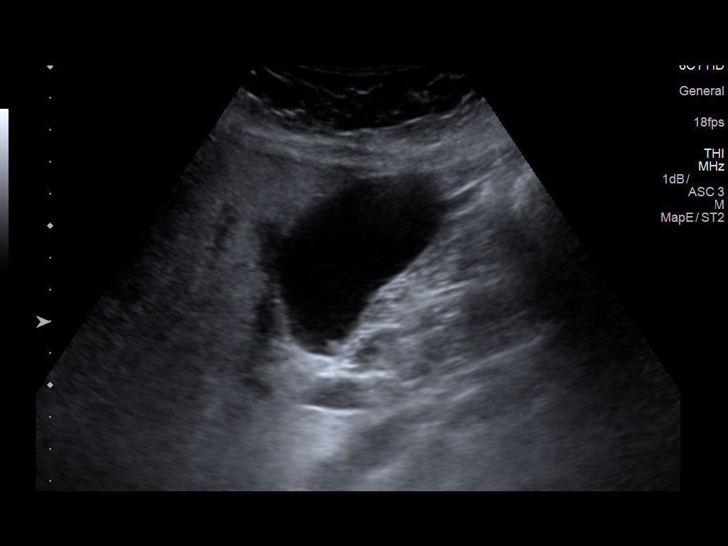
[im 8/94]
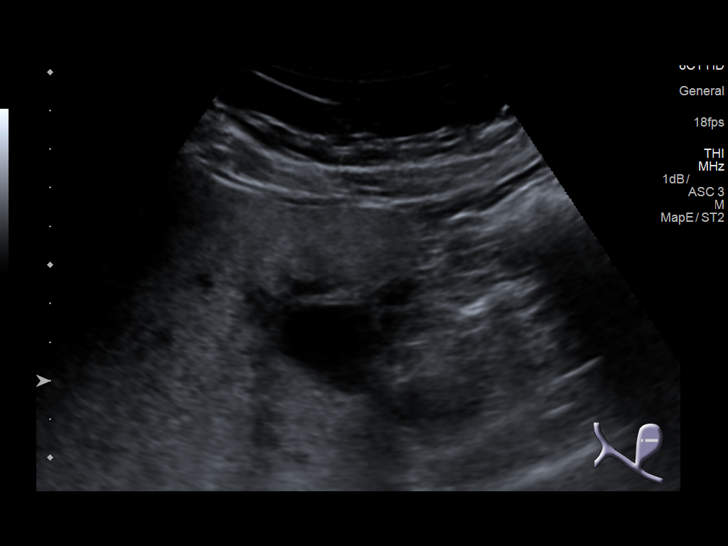
[im 16/94]
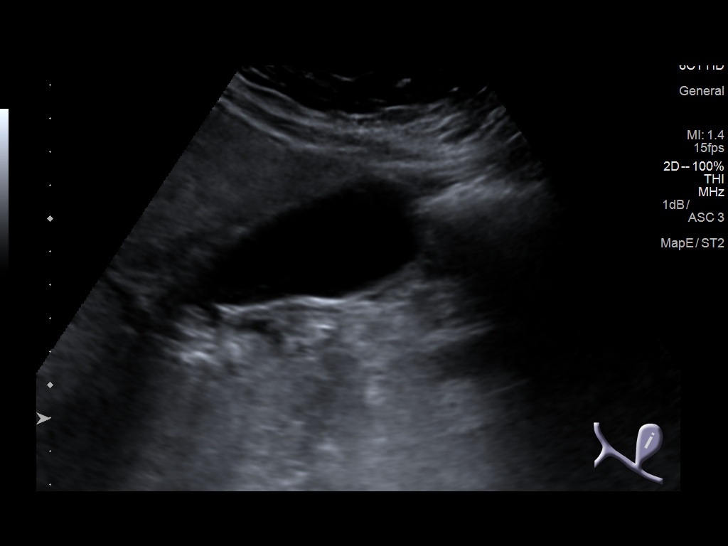
[im 24/94]
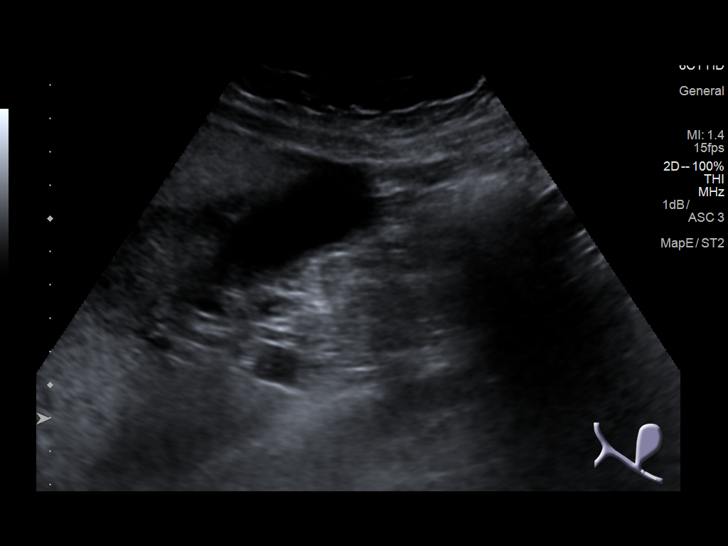
[im 32/94]
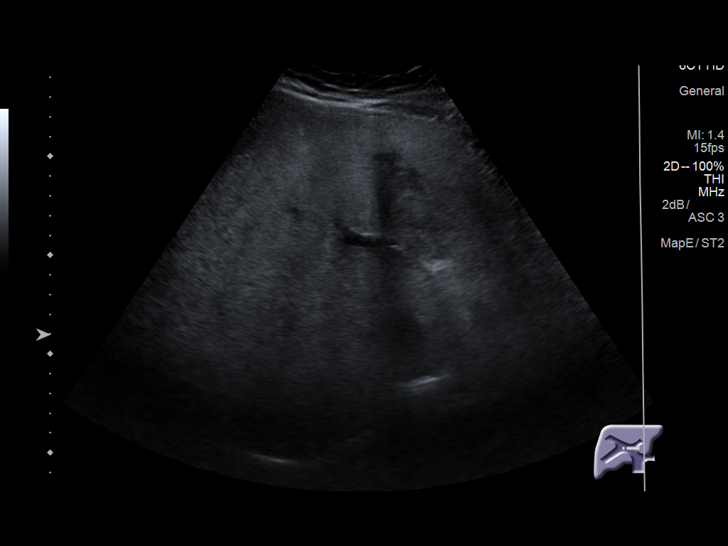
[im 35/94]
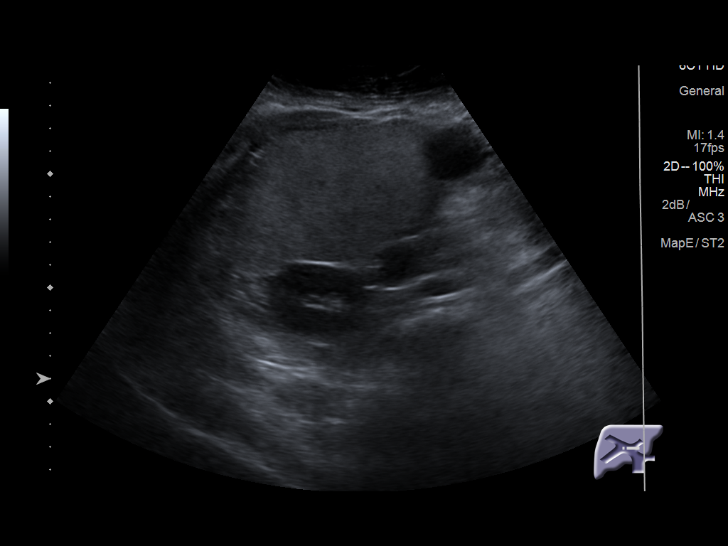
[im 43/94]
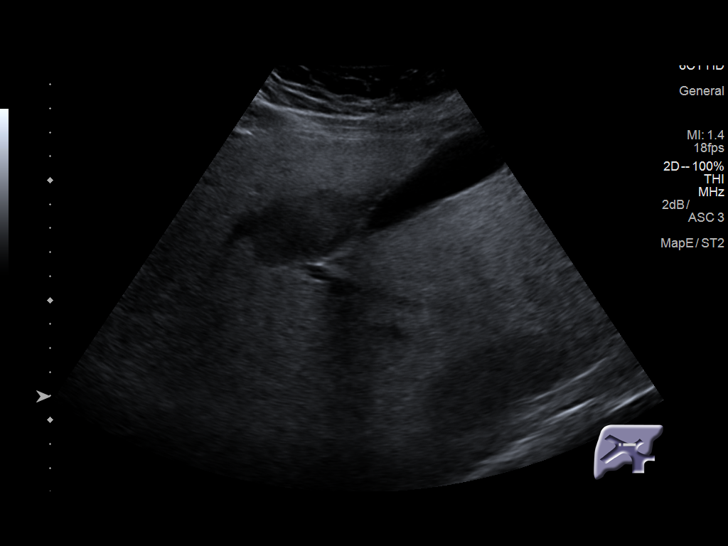
[im 51/94]
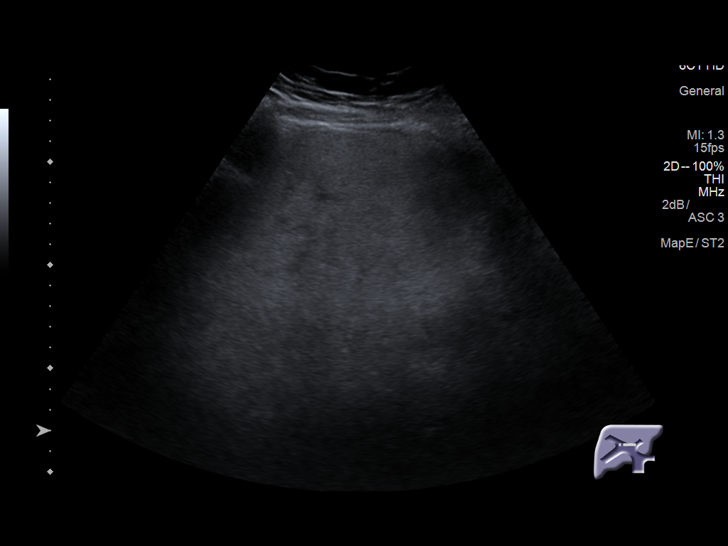
[im 59/94]
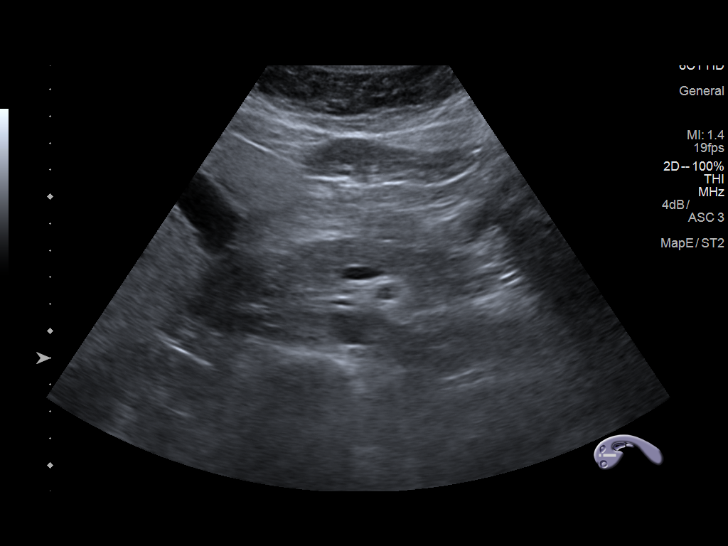
[im 63/94]
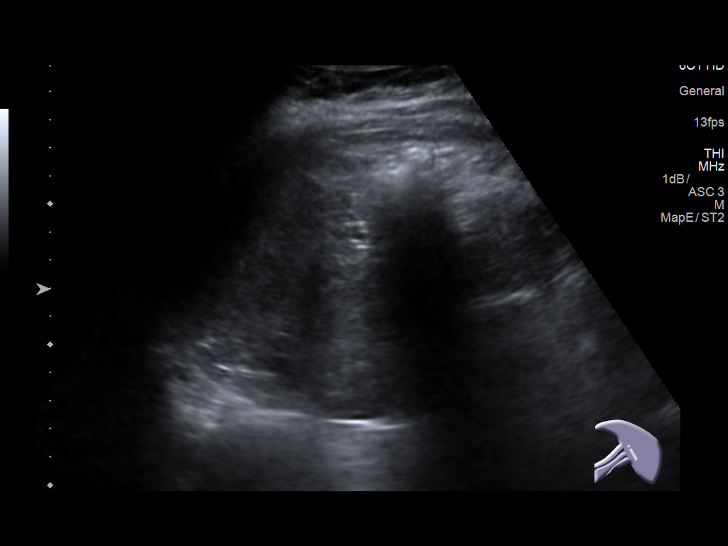
[im 70/94]
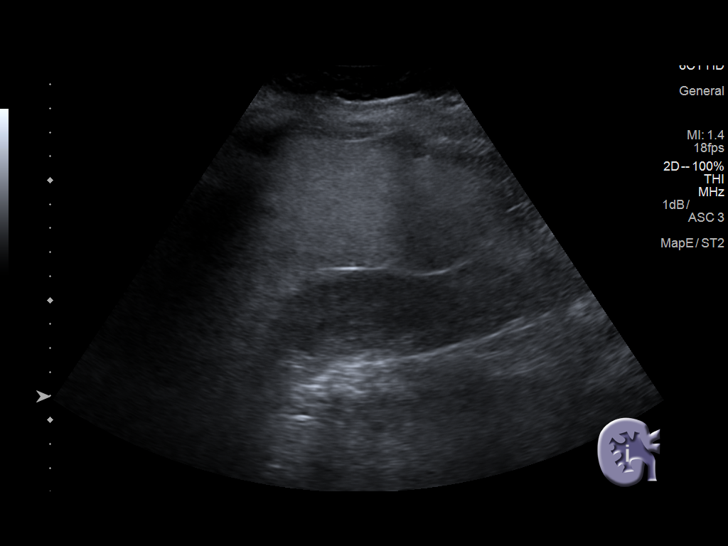
[im 78/94]
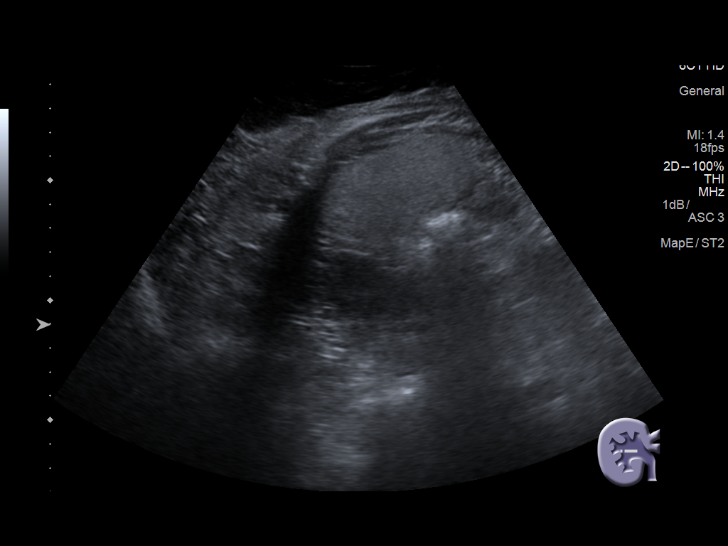
[im 86/94]
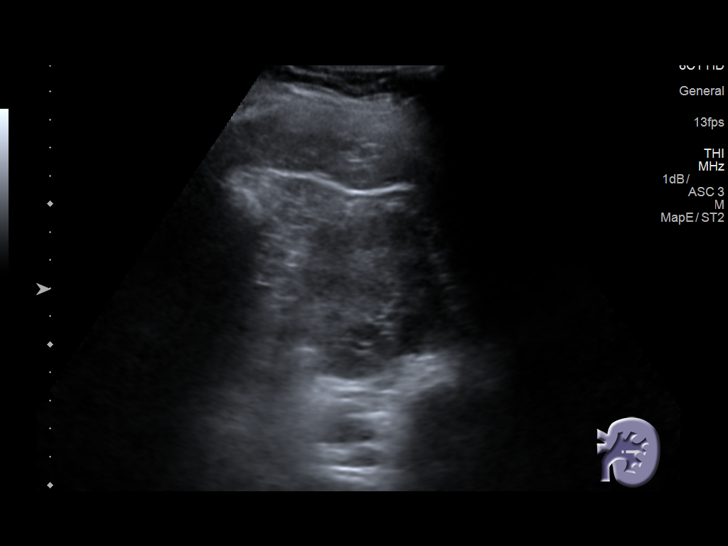
[im 94/94]
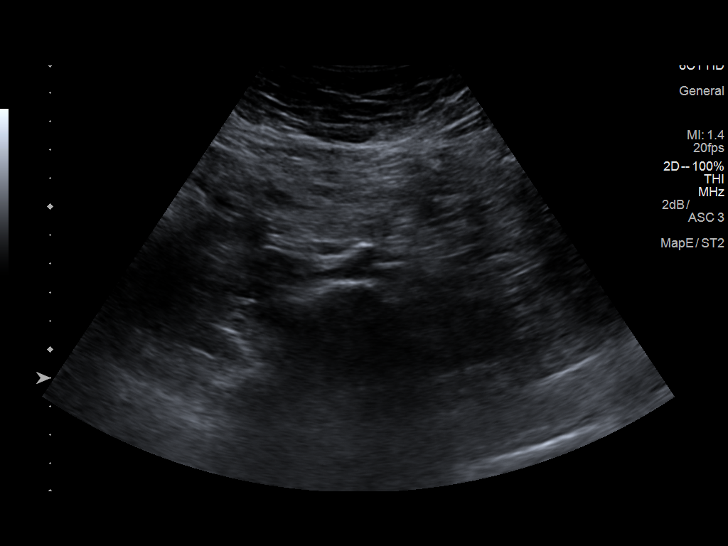

[14 of 25 positions shown; findings below may reference images not displayed]

FINDINGS: Gallbladder: No gallstones or wall thickening visualized. No
sonographic Murphy sign noted by sonographer.

Common bile duct: Diameter: Normal caliber, 4 mm

Liver: Appears enlarged. Increased echotexture compatible with fatty
infiltration. No focal abnormality or biliary ductal dilatation.
Hypoechoic area adjacent to the gallbladder fossa compatible with
focal fatty sparing.

IVC: No abnormality visualized.

Pancreas: Visualized portion unremarkable.

Spleen: Size and appearance within normal limits.

Right Kidney: Length: 11.1 cm. Normal size and echotexture. No focal
abnormality. No hydronephrosis.

Left Kidney: Length: 11.0 cm. Normal size and echotexture. No focal
abnormality. No hydronephrosis.

Abdominal aorta: No aneurysm visualized.

Other findings: None.
IMPRESSION: Enlarged fatty liver. Area of focal fatty sparing adjacent to the
gallbladder fossa.

## 2018-12-09 MED FILL — metFORMIN HCL ER 750 MG TB2: 750 | 30 days supply | Qty: 30 | Fill #4

## 2018-12-10 ENCOUNTER — Other Ambulatory Visit: Payer: Self-pay | Admitting: *Deleted

## 2018-12-10 ENCOUNTER — Telehealth: Payer: Self-pay | Admitting: *Deleted

## 2018-12-10 MED ORDER — BUDESONIDE 180 MCG/ACT IN AEPB: 1.0000 | INHALATION_SPRAY | Freq: Two times a day (BID) | RESPIRATORY_TRACT | 5 refills | Status: DC

## 2018-12-10 NOTE — Telephone Encounter (Signed)
Per Dr. Dellis Anes verbally instructed to prescribe Pulmicort .

## 2018-12-10 NOTE — Telephone Encounter (Signed)
Patient stated that for the last 2 weeks she has been experiencing shortness breath. She has been using her albuterol inhaler 1-2 times daily with little relief. Patient is currently [redacted] weeks pregnant and is requesting a maintenance inhaler to help with her symptoms through out her pregnancy. Please advise.

## 2018-12-10 NOTE — Telephone Encounter (Signed)
Agree with plan. Please start 180 mcg one puff twice daily.  [name], MD [facility]

## 2018-12-15 NOTE — Telephone Encounter (Signed)
PA has been initiated and approved for Pulmicort inhaler. PA has been faxed to the pharmacy, labeled, and placed in bulk scanning. Patient has been contacted and is aware of the approval.

## 2018-12-17 DIAGNOSIS — R52 Pain, unspecified: Secondary | ICD-10-CM | POA: Diagnosis not present

## 2018-12-17 DIAGNOSIS — R05 Cough: Secondary | ICD-10-CM | POA: Diagnosis not present

## 2018-12-17 DIAGNOSIS — Z3A09 9 weeks gestation of pregnancy: Secondary | ICD-10-CM | POA: Diagnosis not present

## 2018-12-21 ENCOUNTER — Ambulatory Visit: Payer: 59 | Admitting: Allergy and Immunology

## 2018-12-23 MED ORDER — FLUCONAZOLE 150 MG PO TABS: ORAL_TABLET | ORAL | 1 refills | Status: DC

## 2018-12-23 MED ORDER — AMOXICILLIN-POT CLAVULANATE 875-125 MG PO TABS: 1.0000 | ORAL_TABLET | Freq: Two times a day (BID) | ORAL | 0 refills | Status: AC

## 2018-12-23 MED FILL — FLUCONAZOLE 150 MG TABS: 150 | 7 days supply | Qty: 2 | Fill #0

## 2018-12-23 MED FILL — AMOX-CLAV 875-125 MG TABLET: 875-125 | 7 days supply | Qty: 14 | Fill #0

## 2018-12-23 NOTE — Progress Notes (Signed)
FOLLOW UP  Date of Service/Encounter:  12/24/18   Assessment:   Seasonal and perennial allergic rhinitis (grasses, weeds, trees, molds, dust mite, cat, cockroach) - on allergen immunotherapy  Anaphylactic shock due to food (shellfish)  Moderate persistent asthma, uncomplicated  Acute sinusitis - on Augmentin  Asthma complicating pregnancy   [name] presents for annual visit.  She recently contracted sinusitis and was started on Augmentin and a low-dose prednisone burst yesterday.  She is feeling better from this.  This likely is what caused her asthma to worsen over the last 2 weeks.  Overall, she has noticed that this pregnancy has resulted in less control of her asthma.  In light of this, we are going to add on Symbicort 160/4.5 mcg 2 puffs twice daily.  Although this is not class B during pregnancy, maintaining her breathing is more important for both her and her fetus.  There are very minimal risk with Symbicort, and the risk certainly are outweighed by the benefits.  I did recommend that she continue her Augmentin and her prednisone.  We will reevaluate how she is doing in a few months.  She will continue to avoid shellfish and her Auvi-Q is up-to-date.  Allergic rhinitis is fairly well-controlled with her allergy shots, which are frozen at 0.05 mL and 0.3 mL.  Plan/Recommendations:   1. Seasonal and perennial allergic rhinitis - Continue with allergy shots at the same schedule. - continue with Nasacort one spray per nostril twice daily.   2. Anaphylactic shock due to food (shellfish)  - Continue with avoid shellfish. [name]- AuviQ is up to date.   3. Moderate persistent asthma, uncomplicated - Lung testing looked low today, but this is likely because of your current illness. - Continue prednisone. - Spacer use reviewed. - Daily controller medication(s): Symbicort 160/4.805mcg two puffs twice daily with spacer - Prior to physical activity: Ventolin 2 puffs 10-15 minutes before  physical activity. - Rescue medications: Ventolin 4 puffs every 4-6 hours as needed - Asthma control goals:  * Full participation in all desired activities (may need albuterol before activity) * Albuterol use two time or less a week on average (not counting use with activity) * Cough interfering with sleep two time or less a month * Oral steroids no more than once a year * No hospitalizations   4. Return in about 6 months (around 06/24/2019).  Subjective:   Dawn Wu is a 33 y.o. female presenting today for follow up of  Chief Complaint  Patient presents with  . Cough    Dawn Wu has a history of the following: Patient Active Problem List   Diagnosis Date Noted  . Acute sinusitis 01/19/2018  . Chest tightness/dyspnea 11/25/2016  . Allergic rhinitis 11/25/2016  . Food allergy 11/25/2016  . s/p vaginal delivery 01/22/2016  . Atypical squamous cells of undetermined significance (ASCUS) on Papanicolaou smear of cervix 01/22/2016  . Class 1 obesity 04/07/2014    History obtained from: chart review and patient.  Dawn Wu is a 33 y.o. female presenting for a follow up visit.  The patient is well-known to our practice.  She was last seen in March 2019.  At that time, she was started on prednisone and started on RyVent.  She was also started on Xhance 2 sprays per nostril twice a day.  It was recommended that she continue to avoid shellfish.  Allergic rhinitis was controlled with the nose sprays and antihistamines.  Asthma/Respiratory Symptom History: Currently she is on Symbicort two puffs  BID with the spacer. She is using her albuterol inhaler 2-3 times per day over the last week. This is typically thourghout the day. She did start prednisone yesterday and Augmentin. ACT is 9 today, indicating poor asthma control. She has not required any hospitalizations or ED visits for her asthma.   Allergic Rhinitis Symptom History: She remains on allergy shots. When she became pregnant,  she was backed down adn frozen. She has not had an injection in 2-3 weeks since she has been sick. She is on Nasacort as needed. She did stop the RyVent because it was not listed as safe for pregnancy.  She is currently on read of both of her vials.  She received 0.05 mL of the vial containing grass, weed, tree, and dust mite.  She receives 0.3 mL of the vial containing mold, cat, and cockroach.  Food Allergy Symptom History: She continues to avoid all shellfish. She did introduce scallops once without a problem but she continues to avoid them all anyway. [name] is up to date.   Otherwise, there is no history of other atopic diseases, including drug allergies, stinging insect allergies, eczema, urticaria or contact dermatitis. There is no significant infectious history. Vaccinations are up to date.   Otherwise, there have been no changes to her past medical history, surgical history, family history, or social history.    Review of Systems  Constitutional: Negative.  Negative for fever, malaise/fatigue and weight loss.  HENT: Positive for congestion and sinus pain. Negative for ear discharge and ear pain.   Eyes: Negative for pain, discharge and redness.  Respiratory: Positive for sputum production and shortness of breath. Negative for cough and wheezing.   Cardiovascular: Negative.  Negative for chest pain and palpitations.  Gastrointestinal: Negative for abdominal pain and heartburn.  Skin: Negative.  Negative for itching and rash.  Neurological: Negative for dizziness and headaches.  Endo/Heme/Allergies: Negative for environmental allergies. Does not bruise/bleed easily.       Objective:   Blood pressure 110/70, pulse (!) 110, temperature (!) 97.5 F (36.4 C), temperature source Oral, resp. rate 18, height 5\' 1"  (1.549 m), weight 173 lb (78.5 kg), SpO2 97 %, currently breastfeeding. Body mass index is 32.69 kg/m.   Physical Exam:  Physical Exam  Constitutional: She appears  well-developed.  HENT:  Head: Normocephalic and atraumatic.  Right Ear: Tympanic membrane, external ear and ear canal normal. No drainage, swelling or tenderness. Tympanic membrane is not injected, not scarred, not erythematous, not retracted and not bulging.  Left Ear: Tympanic membrane, external ear and ear canal normal. No drainage, swelling or tenderness. Tympanic membrane is not injected, not scarred, not erythematous, not retracted and not bulging.  Nose: No mucosal edema, rhinorrhea, nasal deformity or septal deviation. No epistaxis. Right sinus exhibits maxillary sinus tenderness. Right sinus exhibits no frontal sinus tenderness. Left sinus exhibits maxillary sinus tenderness. Left sinus exhibits no frontal sinus tenderness.  Mouth/Throat: Uvula is midline and oropharynx is clear and moist. Mucous membranes are not pale and not dry.  Eyes: Pupils are equal, round, and reactive to light. Conjunctivae and EOM are normal. Right eye exhibits no chemosis and no discharge. Left eye exhibits no chemosis and no discharge. Right conjunctiva is not injected. Left conjunctiva is not injected.  Cardiovascular: Normal rate, regular rhythm and normal heart sounds.  Respiratory: Effort normal. No accessory muscle usage. No tachypnea. No respiratory distress. She has no wheezes. She has no rhonchi. She has rales. She exhibits no tenderness.  Rales throughout,  but moving air well in all lung fields.  There are no wheezes appreciated.  GI: There is no abdominal tenderness. There is no rebound and no guarding.  Lymphadenopathy:       Head (right side): No submandibular, no tonsillar and no occipital adenopathy present.       Head (left side): No submandibular, no tonsillar and no occipital adenopathy present.    She has no cervical adenopathy.  Neurological: She is alert.  Skin: No abrasion, no petechiae and no rash noted. Rash is not papular, not vesicular and not urticarial. No erythema. No pallor.    Psychiatric: She has a normal mood and affect.     Diagnostic studies:    Spirometry: results abnormal (FEV1: 2.27/78%, FVC: 2.61/76%, FEV1/FVC: 87%).    Spirometry consistent with possible restrictive disease.   Allergy Studies: none       [name], MD  [facility]

## 2018-12-24 ENCOUNTER — Encounter: Payer: Self-pay | Admitting: Allergy & Immunology

## 2018-12-24 ENCOUNTER — Ambulatory Visit: Payer: 59 | Admitting: Allergy & Immunology

## 2018-12-24 VITALS — BP 110/70 | HR 110 | Temp 97.5°F | Resp 18 | Ht 61.0 in | Wt 173.0 lb

## 2018-12-24 DIAGNOSIS — J45909 Unspecified asthma, uncomplicated: Secondary | ICD-10-CM | POA: Diagnosis not present

## 2018-12-24 DIAGNOSIS — J302 Other seasonal allergic rhinitis: Secondary | ICD-10-CM | POA: Diagnosis not present

## 2018-12-24 DIAGNOSIS — T7800XD Anaphylactic reaction due to unspecified food, subsequent encounter: Secondary | ICD-10-CM

## 2018-12-24 DIAGNOSIS — J3089 Other allergic rhinitis: Secondary | ICD-10-CM | POA: Diagnosis not present

## 2018-12-24 DIAGNOSIS — O99519 Diseases of the respiratory system complicating pregnancy, unspecified trimester: Secondary | ICD-10-CM

## 2018-12-24 DIAGNOSIS — J4540 Moderate persistent asthma, uncomplicated: Secondary | ICD-10-CM

## 2018-12-24 MED ORDER — BUDESONIDE-FORMOTEROL FUMARATE 160-4.5 MCG/ACT IN AERO: 2.0000 | INHALATION_SPRAY | Freq: Two times a day (BID) | RESPIRATORY_TRACT | 5 refills | Status: DC

## 2018-12-24 NOTE — Addendum Note (Signed)
Addended by: Mariane Duval on: 12/24/2018 04:53 PM   Modules accepted: Orders

## 2018-12-24 NOTE — Patient Instructions (Addendum)
1. Seasonal and perennial allergic rhinitis - Continue with allergy shots at the same schedule. - continue with Nasacort one spray per nostril twice daily.   2. Anaphylactic shock due to food (shellfish)  - Continue with avoid shellfish. Dawn Wu is up to date.   3. Moderate persistent asthma, uncomplicated - Lung testing looked low today, but this is likely because of your current illness. - Continue prednisone. - Spacer use reviewed. - Daily controller medication(s): Symbicort 160/4.51mcg two puffs twice daily with spacer - Prior to physical activity: Ventolin 2 puffs 10-15 minutes before physical activity. - Rescue medications: Ventolin 4 puffs every 4-6 hours as needed - Asthma control goals:  * Full participation in all desired activities (may need albuterol before activity) * Albuterol use two time or less a week on average (not counting use with activity) * Cough interfering with sleep two time or less a month * Oral steroids no more than once a year * No hospitalizations   4. Return in about 6 months (around 06/24/2019).   Please inform us of any Emergency Department visits, hospitalizations, or changes in symptoms. Call us before going to the ED for breathing or allergy symptoms since we might be able to fit you in for a sick visit. Feel free to contact us anytime with any questions, problems, or concerns.  It was a pleasure to see you again today!  Websites that have reliable patient information: 1. American Academy of Asthma, Allergy, and Immunology: www.aaaai.org 2. Food Allergy Research and Education (FARE): foodallergy.org 3. Mothers of Asthmatics: http://www.asthmacommunitynetwork.org 4. American College of Allergy, Asthma, and Immunology: MissingWeapons.ca   Make sure you are registered to vote! If you have moved or changed any of your contact information, you will need to get this updated before voting!    Voter ID laws are NOT going into effect for the General  Election in November 2020! DO NOT let this stop you from exercising your right to vote!

## 2018-12-25 DIAGNOSIS — Z3490 Encounter for supervision of normal pregnancy, unspecified, unspecified trimester: Secondary | ICD-10-CM | POA: Diagnosis not present

## 2018-12-25 DIAGNOSIS — Z3491 Encounter for supervision of normal pregnancy, unspecified, first trimester: Secondary | ICD-10-CM | POA: Diagnosis not present

## 2018-12-25 MED FILL — DOXYLAMINE-PYRIDOXINE 10-10: 10-10 | 30 days supply | Qty: 120 | Fill #0

## 2018-12-28 ENCOUNTER — Other Ambulatory Visit: Payer: Self-pay | Admitting: *Deleted

## 2018-12-28 MED ORDER — FLUCONAZOLE 150 MG PO TABS: ORAL_TABLET | ORAL | 1 refills | Status: DC

## 2018-12-28 MED FILL — FLUCONAZOLE 150 MG TABS: 150 | 7 days supply | Qty: 2 | Fill #0

## 2018-12-29 ENCOUNTER — Ambulatory Visit (INDEPENDENT_AMBULATORY_CARE_PROVIDER_SITE_OTHER): Payer: 59 | Admitting: *Deleted

## 2018-12-29 DIAGNOSIS — J309 Allergic rhinitis, unspecified: Secondary | ICD-10-CM | POA: Diagnosis not present

## 2019-01-04 ENCOUNTER — Ambulatory Visit (INDEPENDENT_AMBULATORY_CARE_PROVIDER_SITE_OTHER): Payer: 59

## 2019-01-04 DIAGNOSIS — J309 Allergic rhinitis, unspecified: Secondary | ICD-10-CM

## 2019-01-04 DIAGNOSIS — Z36 Encounter for antenatal screening for chromosomal anomalies: Secondary | ICD-10-CM | POA: Diagnosis not present

## 2019-01-04 DIAGNOSIS — Z3682 Encounter for antenatal screening for nuchal translucency: Secondary | ICD-10-CM | POA: Diagnosis not present

## 2019-01-11 ENCOUNTER — Ambulatory Visit (INDEPENDENT_AMBULATORY_CARE_PROVIDER_SITE_OTHER): Payer: 59

## 2019-01-11 DIAGNOSIS — J309 Allergic rhinitis, unspecified: Secondary | ICD-10-CM

## 2019-01-14 ENCOUNTER — Encounter: Payer: Self-pay | Admitting: Family Medicine

## 2019-01-14 ENCOUNTER — Other Ambulatory Visit: Payer: Self-pay

## 2019-01-14 ENCOUNTER — Ambulatory Visit: Payer: 59 | Admitting: Family Medicine

## 2019-01-14 VITALS — BP 111/61 | HR 91 | Resp 16 | Ht 62.0 in | Wt 177.0 lb

## 2019-01-14 DIAGNOSIS — N760 Acute vaginitis: Secondary | ICD-10-CM | POA: Diagnosis not present

## 2019-01-14 DIAGNOSIS — N898 Other specified noninflammatory disorders of vagina: Secondary | ICD-10-CM | POA: Diagnosis not present

## 2019-01-14 DIAGNOSIS — B9689 Other specified bacterial agents as the cause of diseases classified elsewhere: Secondary | ICD-10-CM

## 2019-01-14 MED ORDER — HYLAFEM VA SUPP: 1.0000 | Freq: Every day | VAGINAL | 1 refills | Status: AC

## 2019-01-14 MED ORDER — METRONIDAZOLE 0.75 % VA GEL: 1.0000 | Freq: Every day | VAGINAL | 1 refills | Status: DC

## 2019-01-14 NOTE — Progress Notes (Signed)
   Subjective:    Patient ID: Dawn Wu is a 33 y.o. female presenting with Lower back pain and brown discharge (Pt has new OB appt next week but is concerned about the discharge and pain)  on 01/14/2019  HPI: Having light brown discharge, and low back pain x 3-4 days. Denies vaginal irritation or itching. Had BV earlier and treated with vinegar and water and probiotics. She is [redacted] wks pregnant. For new OB next week.  Review of Systems  Constitutional: Negative for chills and fever.  Respiratory: Negative for shortness of breath.   Cardiovascular: Negative for chest pain.  Gastrointestinal: Negative for abdominal pain, nausea and vomiting.  Genitourinary: Negative for dysuria.  Skin: Negative for rash.      Objective:    BP 111/61   Pulse 91   Resp 16   Ht 5\' 2"  (1.575 m)   Wt 177 lb (80.3 kg)   LMP 10/07/2018   Breastfeeding No   BMI 32.37 kg/m  Physical Exam Constitutional:      General: She is not in acute distress.    Appearance: She is well-developed.  HENT:     Head: Normocephalic and atraumatic.  Eyes:     General: No scleral icterus. Neck:     Musculoskeletal: Neck supple.  Cardiovascular:     Rate and Rhythm: Normal rate.  Pulmonary:     Effort: Pulmonary effort is normal.  Abdominal:     Palpations: Abdomen is soft.  Genitourinary:    Vagina: Vaginal discharge (malodorous and brown tinged) present.  Skin:    General: Skin is warm and dry.  Neurological:     Mental Status: She is alert and oriented to person, place, and time.         Assessment & Plan:   Problem List Items Addressed This Visit    None    Visit Diagnoses    Bacterial vaginosis    -  Primary   Relevant Medications   Homeopathic Products (HYLAFEM) SUPP   metroNIDAZOLE (METROGEL) 0.75 % vaginal gel   Other Relevant Orders   Cervicovaginal ancillary only( [place])      Total face-to-face time with patient: 10 minutes. Over 50% of encounter was spent on counseling and  coordination of care. Return for ob visit.  Reva Bores 01/14/2019 1:48 PM

## 2019-01-15 LAB — CERVICOVAGINAL ANCILLARY ONLY
Bacterial vaginitis: NEGATIVE
CANDIDA VAGINITIS: NEGATIVE

## 2019-01-18 ENCOUNTER — Ambulatory Visit (INDEPENDENT_AMBULATORY_CARE_PROVIDER_SITE_OTHER): Payer: 59

## 2019-01-18 DIAGNOSIS — J309 Allergic rhinitis, unspecified: Secondary | ICD-10-CM | POA: Diagnosis not present

## 2019-01-20 ENCOUNTER — Encounter: Payer: Self-pay | Admitting: *Deleted

## 2019-01-20 DIAGNOSIS — O0990 Supervision of high risk pregnancy, unspecified, unspecified trimester: Secondary | ICD-10-CM | POA: Insufficient documentation

## 2019-01-20 DIAGNOSIS — Z3480 Encounter for supervision of other normal pregnancy, unspecified trimester: Principal | ICD-10-CM

## 2019-01-22 ENCOUNTER — Encounter: Payer: Self-pay | Admitting: Certified Nurse Midwife

## 2019-01-22 ENCOUNTER — Ambulatory Visit (INDEPENDENT_AMBULATORY_CARE_PROVIDER_SITE_OTHER): Payer: 59 | Admitting: Certified Nurse Midwife

## 2019-01-22 ENCOUNTER — Other Ambulatory Visit: Payer: Self-pay

## 2019-01-22 DIAGNOSIS — Z8742 Personal history of other diseases of the female genital tract: Secondary | ICD-10-CM | POA: Insufficient documentation

## 2019-01-22 DIAGNOSIS — Z3A14 14 weeks gestation of pregnancy: Secondary | ICD-10-CM

## 2019-01-22 DIAGNOSIS — O99210 Obesity complicating pregnancy, unspecified trimester: Secondary | ICD-10-CM | POA: Insufficient documentation

## 2019-01-22 DIAGNOSIS — O99211 Obesity complicating pregnancy, first trimester: Secondary | ICD-10-CM

## 2019-01-22 DIAGNOSIS — Z3480 Encounter for supervision of other normal pregnancy, unspecified trimester: Secondary | ICD-10-CM

## 2019-01-22 NOTE — Progress Notes (Addendum)
Subjective:  Dawn Wu is a 33 y.o. G2P1001 at [redacted]w[redacted]d being seen today for initial prenatal visit (transfer from [place]).  She is currently monitored for the following issues for this low-risk pregnancy and has Chest tightness/dyspnea; Allergic rhinitis; Food allergy; Acute sinusitis; Supervision of other normal pregnancy, antepartum; History of PCOS; and Obesity affecting pregnancy on their problem list.  Patient reports no complaints.  Contractions: Not present. Vag. Bleeding: None.  Movement: Absent. Denies leaking of fluid.   The following portions of the patient's history were reviewed and updated as appropriate: allergies, current medications, past family history, past medical history, past social history, past surgical history and problem list. Problem list updated.  Objective:   Vitals:   01/22/19 1033  BP: 109/70  Pulse: 98  Weight: 80.3 kg    Fetal Status: Fetal Heart Rate (bpm): 142   Movement: Absent     General:  Alert, oriented and cooperative. Patient is in no acute distress.  Skin: Skin is warm and dry. No rash noted.   Cardiovascular: RRR  Respiratory: CTAB  Abdomen: Soft, gravid, appropriate for gestational age. Pain/Pressure: Absent     Pelvic: Vag. Bleeding: None Vag D/C Character: Thin   Cervical exam deferred        Extremities: Normal range of motion.  Edema: None  Mental Status: Normal mood and affect. Normal behavior. Normal judgment and thought content.   Urinalysis:      Assessment and Plan:  Pregnancy: G2P1001 at [redacted]w[redacted]d  1. Supervision of other normal pregnancy, antepartum - Babyscripts Schedule Optimization - [name] MFM OB COMP + 14 WK; Future  2. History of PCOS - a1C 5.8 on 12/04/18  3. Obesity affecting pregnancy in first trimester  Preterm labor symptoms and general obstetric precautions including but not limited to vaginal bleeding, contractions, leaking of fluid and fetal movement were reviewed in detail with the patient. Please refer  to After Visit Summary for other counseling recommendations.  Return in about 6 weeks (around 03/05/2019).   Donette Larry, CNM

## 2019-01-22 NOTE — Progress Notes (Signed)
Last pap 10/18- negative

## 2019-01-25 ENCOUNTER — Ambulatory Visit (INDEPENDENT_AMBULATORY_CARE_PROVIDER_SITE_OTHER): Payer: 59

## 2019-01-25 DIAGNOSIS — J309 Allergic rhinitis, unspecified: Secondary | ICD-10-CM | POA: Diagnosis not present

## 2019-02-01 DIAGNOSIS — J3089 Other allergic rhinitis: Secondary | ICD-10-CM | POA: Diagnosis not present

## 2019-02-01 NOTE — Progress Notes (Signed)
EXP 02/01/20 

## 2019-02-10 ENCOUNTER — Other Ambulatory Visit: Payer: Self-pay

## 2019-02-10 MED ORDER — OLOPATADINE HCL 0.2 % OP SOLN: 1.0000 [drp] | OPHTHALMIC | 5 refills | Status: DC

## 2019-02-22 ENCOUNTER — Other Ambulatory Visit (HOSPITAL_COMMUNITY): Payer: Self-pay | Admitting: *Deleted

## 2019-02-22 ENCOUNTER — Ambulatory Visit (HOSPITAL_COMMUNITY)
Admission: RE | Admit: 2019-02-22 | Discharge: 2019-02-22 | Disposition: A | Discharge status: Home / Self Care | Payer: 59 | Source: Ambulatory Visit | Attending: Obstetrics and Gynecology | Admitting: Obstetrics and Gynecology

## 2019-02-22 ENCOUNTER — Encounter: Payer: Self-pay | Admitting: *Deleted

## 2019-02-22 ENCOUNTER — Other Ambulatory Visit: Payer: Self-pay | Admitting: Certified Nurse Midwife

## 2019-02-22 ENCOUNTER — Ambulatory Visit (HOSPITAL_COMMUNITY): Payer: 59

## 2019-02-22 ENCOUNTER — Other Ambulatory Visit: Payer: Self-pay

## 2019-02-22 DIAGNOSIS — O283 Abnormal ultrasonic finding on antenatal screening of mother: Secondary | ICD-10-CM | POA: Insufficient documentation

## 2019-02-22 DIAGNOSIS — Z363 Encounter for antenatal screening for malformations: Secondary | ICD-10-CM

## 2019-02-22 DIAGNOSIS — Z3480 Encounter for supervision of other normal pregnancy, unspecified trimester: Secondary | ICD-10-CM | POA: Diagnosis not present

## 2019-02-22 DIAGNOSIS — Z3A19 19 weeks gestation of pregnancy: Secondary | ICD-10-CM | POA: Diagnosis not present

## 2019-02-22 DIAGNOSIS — O359XX0 Maternal care for (suspected) fetal abnormality and damage, unspecified, not applicable or unspecified: Secondary | ICD-10-CM

## 2019-02-22 DIAGNOSIS — O99211 Obesity complicating pregnancy, first trimester: Secondary | ICD-10-CM | POA: Insufficient documentation

## 2019-02-22 DIAGNOSIS — Z362 Encounter for other antenatal screening follow-up: Secondary | ICD-10-CM

## 2019-02-26 ENCOUNTER — Telehealth (HOSPITAL_COMMUNITY): Payer: Self-pay | Admitting: *Deleted

## 2019-02-26 LAB — MATERNIT 21 PLUS CORE, BLOOD
Fetal Fraction: 9
Result (T21): NEGATIVE
Trisomy 13 (Patau syndrome): NEGATIVE
Trisomy 18 (Edwards syndrome): NEGATIVE
Trisomy 21 (Down syndrome): NEGATIVE

## 2019-02-26 NOTE — Telephone Encounter (Signed)
Name and DOB verified, negative maternit21 results given.  Pt very happy about results.

## 2019-03-02 ENCOUNTER — Ambulatory Visit (INDEPENDENT_AMBULATORY_CARE_PROVIDER_SITE_OTHER): Payer: 59 | Admitting: Certified Nurse Midwife

## 2019-03-02 ENCOUNTER — Encounter: Payer: Self-pay | Admitting: Certified Nurse Midwife

## 2019-03-02 ENCOUNTER — Other Ambulatory Visit: Payer: Self-pay

## 2019-03-02 VITALS — BP 115/64 | HR 90 | Wt 177.0 lb

## 2019-03-02 DIAGNOSIS — Z3A20 20 weeks gestation of pregnancy: Secondary | ICD-10-CM

## 2019-03-02 DIAGNOSIS — O26892 Other specified pregnancy related conditions, second trimester: Secondary | ICD-10-CM

## 2019-03-02 DIAGNOSIS — Z3480 Encounter for supervision of other normal pregnancy, unspecified trimester: Secondary | ICD-10-CM

## 2019-03-02 DIAGNOSIS — O99210 Obesity complicating pregnancy, unspecified trimester: Secondary | ICD-10-CM | POA: Diagnosis not present

## 2019-03-02 DIAGNOSIS — O99212 Obesity complicating pregnancy, second trimester: Secondary | ICD-10-CM

## 2019-03-02 DIAGNOSIS — O283 Abnormal ultrasonic finding on antenatal screening of mother: Secondary | ICD-10-CM

## 2019-03-02 DIAGNOSIS — N898 Other specified noninflammatory disorders of vagina: Secondary | ICD-10-CM

## 2019-03-02 NOTE — Patient Instructions (Signed)
Glucose Tolerance Test During Pregnancy Why am I having this test? The glucose tolerance test (GTT) is done to check how your body processes sugar (glucose). This is one of several tests used to diagnose diabetes that develops during pregnancy (gestational diabetes mellitus). Gestational diabetes is a temporary form of diabetes that some women develop during pregnancy. It usually occurs during the second trimester of pregnancy and goes away after delivery. Testing (screening) for gestational diabetes usually occurs between 24 and 28 weeks of pregnancy. You may have the GTT test after having a 1-hour glucose screening test if the results from that test indicate that you may have gestational diabetes. You may also have this test if:  You have a history of gestational diabetes.  You have a history of giving birth to very large babies or have experienced repeated fetal loss (stillbirth).  You have signs and symptoms of diabetes, such as: ? Changes in your vision. ? Tingling or numbness in your hands or feet. ? Changes in hunger, thirst, and urination that are not otherwise explained by your pregnancy. What is being tested? This test measures the amount of glucose in your blood at different times during a period of 3 hours. This indicates how well your body is able to process glucose. What kind of sample is taken?  Blood samples are required for this test. They are usually collected by inserting a needle into a blood vessel. How do I prepare for this test?  For 3 days before your test, eat normally. Have plenty of carbohydrate-rich foods.  Follow instructions from your health care provider about: ? Eating or drinking restrictions on the day of the test. You may be asked to not eat or drink anything other than water (fast) starting 8-10 hours before the test. ? Changing or stopping your regular medicines. Some medicines may interfere with this test. Tell a health care provider about:  All  medicines you are taking, including vitamins, herbs, eye drops, creams, and over-the-counter medicines.  Any blood disorders you have.  Any surgeries you have had.  Any medical conditions you have. What happens during the test? First, your blood glucose will be measured. This is referred to as your fasting blood glucose, since you fasted before the test. Then, you will drink a glucose solution that contains a certain amount of glucose. Your blood glucose will be measured again 1, 2, and 3 hours after drinking the solution. This test takes about 3 hours to complete. You will need to stay at the testing location during this time. During the testing period:  Do not eat or drink anything other than the glucose solution.  Do not exercise.  Do not use any products that contain nicotine or tobacco, such as cigarettes and e-cigarettes. If you need help stopping, ask your health care provider. The testing procedure may vary among health care providers and hospitals. How are the results reported? Your results will be reported as milligrams of glucose per deciliter of blood (mg/dL) or millimoles per liter (mmol/L). Your health care provider will compare your results to normal ranges that were established after testing a large group of people (reference ranges). Reference ranges may vary among labs and hospitals. For this test, common reference ranges are:  Fasting: less than 95-105 mg/dL (5.3-5.8 mmol/L).  1 hour after drinking glucose: less than 180-190 mg/dL (10.0-10.5 mmol/L).  2 hours after drinking glucose: less than 155-165 mg/dL (8.6-9.2 mmol/L).  3 hours after drinking glucose: 140-145 mg/dL (7.8-8.1 mmol/L). What do the   results mean? Results within reference ranges are considered normal, meaning that your glucose levels are well-controlled. If two or more of your blood glucose levels are high, you may be diagnosed with gestational diabetes. If only one level is high, your health care  provider may suggest repeat testing or other tests to confirm a diagnosis. Talk with your health care provider about what your results mean. Questions to ask your health care provider Ask your health care provider, or the department that is doing the test:  When will my results be ready?  How will I get my results?  What are my treatment options?  What other tests do I need?  What are my next steps? Summary  The glucose tolerance test (GTT) is one of several tests used to diagnose diabetes that develops during pregnancy (gestational diabetes mellitus). Gestational diabetes is a temporary form of diabetes that some women develop during pregnancy.  You may have the GTT test after having a 1-hour glucose screening test if the results from that test indicate that you may have gestational diabetes. You may also have this test if you have any symptoms or risk factors for gestational diabetes.  Talk with your health care provider about what your results mean. This information is not intended to replace advice given to you by your health care provider. Make sure you discuss any questions you have with your health care provider. Document Released: 04/21/2012 Document Revised: 06/02/2017 Document Reviewed: 06/02/2017 Elsevier Interactive Patient Education  2019 Elsevier Inc.  

## 2019-03-02 NOTE — Progress Notes (Signed)
Pt c/o "discharge brown and watery"

## 2019-03-02 NOTE — Progress Notes (Signed)
   PRENATAL VISIT NOTE  Subjective:  Dawn Wu is a 33 y.o. G2P1001 at [redacted]w[redacted]d being seen today for ongoing prenatal care.  She is currently monitored for the following issues for this low-risk pregnancy and has Chest tightness/dyspnea; Allergic rhinitis; Food allergy; Acute sinusitis; Supervision of other normal pregnancy, antepartum; History of PCOS; Obesity affecting pregnancy; and Abnormal fetal ultrasound on their problem list.  Patient reports vaginal discharge.  Contractions: Not present. Vag. Bleeding: None.  Movement: Present. Denies leaking of fluid.   The following portions of the patient's history were reviewed and updated as appropriate: allergies, current medications, past family history, past medical history, past social history, past surgical history and problem list.   Objective:   Vitals:   03/02/19 1000  BP: 115/64  Pulse: 90  Weight: 177 lb (80.3 kg)    Fetal Status: Fetal Heart Rate (bpm): 131 Fundal Height: 23 cm Movement: Present     General:  Alert, oriented and cooperative. Patient is in no acute distress.  Skin: Skin is warm and dry. No rash noted.   Cardiovascular: Normal heart rate noted  Respiratory: Normal respiratory effort, no problems with respiration noted  Abdomen: Soft, gravid, appropriate for gestational age.  Pain/Pressure: Absent     Pelvic: Cervical exam deferred        Extremities: Normal range of motion.  Edema: None  Mental Status: Normal mood and affect. Normal behavior. Normal judgment and thought content.   Assessment and Plan:  Pregnancy: G2P1001 at [redacted]w[redacted]d 1. Supervision of other normal pregnancy, antepartum - Patient reports continued vaginal discharge after treatment, describes discharge as brown sometimes watery thin discharge  - Completed Metrogel last month  - Pelvic examination noted thin white discharge with odor present, most likely BV again, will manage accordingly based on repeat swabs- will give oral Flagyl or clindamycin  for treatment due to recurrence  - Cervicovaginal ancillary only( [place]) - Alpha fetoprotein, maternal  2. Obesity affecting pregnancy, antepartum - HgB A1c  3. Abnormal fetal ultrasound - Follow up US scheduled for 5/18 - AFP collected at todays visit  - NIPS negative   Preterm labor symptoms and general obstetric precautions including but not limited to vaginal bleeding, contractions, leaking of fluid and fetal movement were reviewed in detail with the patient. Please refer to After Visit Summary for other counseling recommendations.   Return in about 8 weeks (around 04/27/2019) for ROB/2hrGTT.  Future Appointments  Date Time Provider Department Center  03/22/2019 10:15 AM [facility] [name] 4 [facility]US [facility]  04/27/2019  8:45 AM Sharyon Cable, CNM [facility]    Sharyon Cable, CNM

## 2019-03-03 ENCOUNTER — Telehealth: Payer: Self-pay | Admitting: *Deleted

## 2019-03-03 LAB — HEMOGLOBIN A1C
Hgb A1c MFr Bld: 5.4 % of total Hgb (ref <5.7)
Mean Plasma Glucose: 108 (calc)
eAG (mmol/L): 6 (calc)

## 2019-03-03 LAB — CERVICOVAGINAL ANCILLARY ONLY
Bacterial vaginitis: NEGATIVE
Candida vaginitis: NEGATIVE

## 2019-03-03 MED ORDER — CLINDAMYCIN HCL 300 MG PO CAPS: 300.0000 mg | ORAL_CAPSULE | Freq: Two times a day (BID) | ORAL | 0 refills | Status: DC

## 2019-03-03 MED ORDER — FAMOTIDINE 20 MG PO TABS: 20.0000 mg | ORAL_TABLET | Freq: Two times a day (BID) | ORAL | 3 refills | Status: DC

## 2019-03-03 NOTE — Addendum Note (Signed)
Addended by: Sharyon Cable on: 03/03/2019 03:28 PM   Modules accepted: Orders

## 2019-03-03 NOTE — Telephone Encounter (Signed)
Pt called stating that she has been taking Pepcid 10 mg but now with Covid-19 she is unable to get OTC now.  Sent a RX into [facility] pharmacy for Pepcid 20 mg so that she can get it via RX.

## 2019-03-04 LAB — ALPHA FETOPROTEIN, MATERNAL
AFP MoM: 1.01
AFP, Serum: 51.6 ng/mL
Calc'd Gestational Age: 20.1 weeks
Maternal Wt: 177 [lb_av]
Risk for ONTD: 1
Twins-AFP: 1

## 2019-03-22 ENCOUNTER — Other Ambulatory Visit (HOSPITAL_COMMUNITY): Payer: Self-pay | Admitting: *Deleted

## 2019-03-22 ENCOUNTER — Ambulatory Visit (HOSPITAL_COMMUNITY)
Admission: RE | Admit: 2019-03-22 | Discharge: 2019-03-22 | Disposition: A | Discharge status: Home / Self Care | Payer: 59 | Source: Ambulatory Visit | Attending: Obstetrics and Gynecology | Admitting: Obstetrics and Gynecology

## 2019-03-22 ENCOUNTER — Other Ambulatory Visit: Payer: Self-pay

## 2019-03-22 DIAGNOSIS — Z362 Encounter for other antenatal screening follow-up: Secondary | ICD-10-CM | POA: Diagnosis not present

## 2019-03-22 DIAGNOSIS — O283 Abnormal ultrasonic finding on antenatal screening of mother: Secondary | ICD-10-CM | POA: Diagnosis not present

## 2019-03-22 DIAGNOSIS — Z3A23 23 weeks gestation of pregnancy: Secondary | ICD-10-CM

## 2019-03-22 DIAGNOSIS — O359XX0 Maternal care for (suspected) fetal abnormality and damage, unspecified, not applicable or unspecified: Secondary | ICD-10-CM

## 2019-03-22 DIAGNOSIS — O99212 Obesity complicating pregnancy, second trimester: Secondary | ICD-10-CM | POA: Diagnosis not present

## 2019-03-24 ENCOUNTER — Telehealth: Payer: Self-pay

## 2019-03-24 NOTE — Telephone Encounter (Signed)
Pt called stating that her husband's co-worker tested positive for COVID-19. PT states her husband and the co-worker work very closely throughout the day and ride in the same truck. Pt wants to know if she needs to get tested or what she should do. I spoke with Armandina Stammer, RN and she states that the pt needs to self isolate from husband for 14 days and if she develops any symptoms then she needs to be seen at the [facility] for testing. Pt is aware of these recommendations and expressed understanding.

## 2019-04-06 ENCOUNTER — Other Ambulatory Visit: Payer: Self-pay

## 2019-04-06 ENCOUNTER — Ambulatory Visit (INDEPENDENT_AMBULATORY_CARE_PROVIDER_SITE_OTHER): Payer: 59

## 2019-04-06 DIAGNOSIS — N898 Other specified noninflammatory disorders of vagina: Secondary | ICD-10-CM | POA: Diagnosis not present

## 2019-04-06 NOTE — Progress Notes (Signed)
Pt c/o brown discharge with odor. Self swab sent. PT is aware we will call with results and treat if necessary.

## 2019-04-07 LAB — CERVICOVAGINAL ANCILLARY ONLY
Bacterial vaginitis: NEGATIVE
Candida vaginitis: NEGATIVE

## 2019-04-15 DIAGNOSIS — M9902 Segmental and somatic dysfunction of thoracic region: Secondary | ICD-10-CM | POA: Diagnosis not present

## 2019-04-15 DIAGNOSIS — M5417 Radiculopathy, lumbosacral region: Secondary | ICD-10-CM | POA: Diagnosis not present

## 2019-04-15 DIAGNOSIS — M62830 Muscle spasm of back: Secondary | ICD-10-CM | POA: Diagnosis not present

## 2019-04-15 DIAGNOSIS — M9904 Segmental and somatic dysfunction of sacral region: Secondary | ICD-10-CM | POA: Diagnosis not present

## 2019-04-15 DIAGNOSIS — M9903 Segmental and somatic dysfunction of lumbar region: Secondary | ICD-10-CM | POA: Diagnosis not present

## 2019-04-19 DIAGNOSIS — M5417 Radiculopathy, lumbosacral region: Secondary | ICD-10-CM | POA: Diagnosis not present

## 2019-04-19 DIAGNOSIS — M62830 Muscle spasm of back: Secondary | ICD-10-CM | POA: Diagnosis not present

## 2019-04-19 DIAGNOSIS — M9903 Segmental and somatic dysfunction of lumbar region: Secondary | ICD-10-CM | POA: Diagnosis not present

## 2019-04-19 DIAGNOSIS — M9904 Segmental and somatic dysfunction of sacral region: Secondary | ICD-10-CM | POA: Diagnosis not present

## 2019-04-19 DIAGNOSIS — M9902 Segmental and somatic dysfunction of thoracic region: Secondary | ICD-10-CM | POA: Diagnosis not present

## 2019-04-27 ENCOUNTER — Encounter: Payer: Self-pay | Admitting: Certified Nurse Midwife

## 2019-04-27 ENCOUNTER — Other Ambulatory Visit: Payer: Self-pay

## 2019-04-27 ENCOUNTER — Ambulatory Visit (INDEPENDENT_AMBULATORY_CARE_PROVIDER_SITE_OTHER): Payer: 59 | Admitting: Certified Nurse Midwife

## 2019-04-27 VITALS — BP 112/67 | HR 96 | Wt 181.0 lb

## 2019-04-27 DIAGNOSIS — Z3480 Encounter for supervision of other normal pregnancy, unspecified trimester: Secondary | ICD-10-CM | POA: Diagnosis not present

## 2019-04-27 DIAGNOSIS — Z3A28 28 weeks gestation of pregnancy: Secondary | ICD-10-CM

## 2019-04-27 DIAGNOSIS — O99213 Obesity complicating pregnancy, third trimester: Secondary | ICD-10-CM

## 2019-04-27 DIAGNOSIS — O283 Abnormal ultrasonic finding on antenatal screening of mother: Secondary | ICD-10-CM

## 2019-04-27 DIAGNOSIS — O99210 Obesity complicating pregnancy, unspecified trimester: Secondary | ICD-10-CM

## 2019-04-27 DIAGNOSIS — Z23 Encounter for immunization: Secondary | ICD-10-CM

## 2019-04-27 DIAGNOSIS — O24410 Gestational diabetes mellitus in pregnancy, diet controlled: Secondary | ICD-10-CM

## 2019-04-27 NOTE — Patient Instructions (Signed)

## 2019-04-27 NOTE — Progress Notes (Signed)
   PRENATAL VISIT NOTE  Subjective:  Dawn Wu is a 33 y.o. G2P1001 at [redacted]w[redacted]d being seen today for ongoing prenatal care.  She is currently monitored for the following issues for this low-risk pregnancy and has Chest tightness/dyspnea; Allergic rhinitis; Food allergy; Acute sinusitis; Supervision of other normal pregnancy, antepartum; History of PCOS; Obesity affecting pregnancy; and Abnormal fetal ultrasound on their problem list.  Patient reports no complaints.  Contractions: Not present. Vag. Bleeding: None.  Movement: Present. Denies leaking of fluid.   The following portions of the patient's history were reviewed and updated as appropriate: allergies, current medications, past family history, past medical history, past social history, past surgical history and problem list.   Objective:   Vitals:   04/27/19 0901  BP: 112/67  Pulse: 96  Weight: 181 lb (82.1 kg)    Fetal Status: Fetal Heart Rate (bpm): 128 Fundal Height: 30 cm Movement: Present     General:  Alert, oriented and cooperative. Patient is in no acute distress.  Skin: Skin is warm and dry. No rash noted.   Cardiovascular: Normal heart rate noted  Respiratory: Normal respiratory effort, no problems with respiration noted  Abdomen: Soft, gravid, appropriate for gestational age.  Pain/Pressure: Absent     Pelvic: Cervical exam deferred        Extremities: Normal range of motion.  Edema: Trace  Mental Status: Normal mood and affect. Normal behavior. Normal judgment and thought content.   Assessment and Plan:  Pregnancy: G2P1001 at [redacted]w[redacted]d 1. Supervision of other normal pregnancy, antepartum - Patient doing well, no complaints reports vaginal discharge has resolved- occurs occasionally but denies odor or irritation  - Routine prenatal care - Anticipatory guidance on upcoming appointments with next visit being virtual appointment, patient initially had concern about virtual appointment but after discussion and education  agrees  - 2Hr GTT w/ 1 Hr Carpenter 75 g - CBC - HIV antibody (with reflex) - RPR  2. Obesity affecting pregnancy, antepartum - F/U US scheduled for 7/20   3. Abnormal fetal ultrasound - Patient initial [name] noted increased nuchal fold, NIPS and AFP negative  - Follow up US on 5/18, noted normal interval growth and nuchal fold appeared normal  - MFM recommends delivery by 40 weeks at this [name]   Preterm labor symptoms and general obstetric precautions including but not limited to vaginal bleeding, contractions, leaking of fluid and fetal movement were reviewed in detail with the patient. Please refer to After Visit Summary for other counseling recommendations.   Return in about 4 weeks (around 05/25/2019) for ROB-mychart visit .  Future Appointments  Date Time Provider [place]  05/24/2019  3:30 PM [name] 1 [facility]-MFCUS [facility]-US  05/25/2019  9:15 AM Lajean Manes, [place] [facility]    Lajean Manes, CNM

## 2019-04-28 ENCOUNTER — Encounter: Payer: Self-pay | Admitting: Certified Nurse Midwife

## 2019-04-28 DIAGNOSIS — O24419 Gestational diabetes mellitus in pregnancy, unspecified control: Secondary | ICD-10-CM | POA: Insufficient documentation

## 2019-04-28 LAB — 2HR GTT W 1 HR, CARPENTER, 75 G
Glucose, 1 Hr, Gest: 118 mg/dL (ref 65–179)
Glucose, 2 Hr, Gest: 133 mg/dL (ref 65–152)
Glucose, Fasting, Gest: 93 mg/dL — ABNORMAL HIGH (ref 65–91)

## 2019-04-28 LAB — CBC
HCT: 37.6 % (ref 35.0–45.0)
Hemoglobin: 12.5 g/dL (ref 11.7–15.5)
MCH: 27.1 pg (ref 27.0–33.0)
MCHC: 33.2 g/dL (ref 32.0–36.0)
MCV: 81.4 fL (ref 80.0–100.0)
MPV: 12.7 fL — ABNORMAL HIGH (ref 7.5–12.5)
Platelets: 191 10*3/uL (ref 140–400)
RBC: 4.62 10*6/uL (ref 3.80–5.10)
RDW: 13.4 % (ref 11.0–15.0)
WBC: 9.4 10*3/uL (ref 3.8–10.8)

## 2019-04-28 LAB — HIV ANTIBODY (ROUTINE TESTING W REFLEX): HIV 1&2 Ab, 4th Generation: NONREACTIVE

## 2019-04-28 LAB — RPR: RPR Ser Ql: NONREACTIVE

## 2019-04-28 MED ORDER — ACCU-CHEK FASTCLIX LANCETS MISC: 1.0000 | Freq: Four times a day (QID) | 12 refills | Status: DC

## 2019-04-28 MED ORDER — ACCU-CHEK GUIDE W/DEVICE KIT: 1.0000 | PACK | Freq: Four times a day (QID) | 0 refills | Status: DC

## 2019-04-28 MED ORDER — GLUCOSE BLOOD VI STRP: ORAL_STRIP | 12 refills | Status: DC

## 2019-04-28 MED FILL — FREESTYLE LITE TEST STRIP: 25 days supply | Qty: 100 | Fill #0

## 2019-04-28 MED FILL — FREESTYLE LITE METER: 90 days supply | Qty: 1 | Fill #0

## 2019-04-28 MED FILL — FREESTYLE LANCETS: 25 days supply | Qty: 100 | Fill #0

## 2019-04-28 NOTE — Addendum Note (Signed)
Addended by: Lajean Manes on: 04/28/2019 10:40 AM   Modules accepted: Orders

## 2019-04-29 ENCOUNTER — Encounter: Payer: Self-pay | Admitting: *Deleted

## 2019-04-29 ENCOUNTER — Other Ambulatory Visit: Payer: Self-pay | Admitting: *Deleted

## 2019-04-29 NOTE — Patient Outreach (Addendum)
[place] [facility]) Care Management  [place]  04/29/2019   Mina Babula 05-22-1986 [id number]  Subjective: Dawn Wu contacted the [place] Management department to enroll in the [place] Management Gestational Diabetes Program. She says she was diagnosed with gestational diabetes on 04/27/19 via a glucose tolerance test. She says this is her second child;  she has a 31 year old son. She says she is having a boy and her due date is 07/19/19.  She says she did not have gestational diabetes with her first pregnancy. She says her mom and dad have Type 2 diabetes and were diagnosed as adults.  She says she picked up her diabetes testing supplies yesterday at the [facility] outpatient pharmacy.  She reports her fasting blood sugar this morning as 94 and her 2 hour post meal was 130. She says she was told to check her blood sugars 4 times daily- a fasting in the morning and 2 hours after each meal.   She says she is interested in attending the gestational diabetes class offered by [facility] at the [facility].   Objective: Hgb A1C= 5.4% on 03/02/19  Encounter Medications:  Outpatient Encounter Medications as of 04/29/2019  Medication Sig  . omeprazole (PRILOSEC) 20 MG capsule Take 20 mg by mouth 2 (two) times daily before a meal.  . Prenatal Vit-Fe Fumarate-FA (PRENATAL VITAMINS PO) Take by mouth.  . Accu-Chek FastClix Lancets MISC 1 Device by Percutaneous route 4 (four) times daily.  Marland Kitchen acetaminophen (TYLENOL) 500 MG tablet Take 500 mg by mouth every 8 (eight) hours as needed for mild pain or moderate pain.  . Freestyle glucose blood test strip Use as instructed   No facility-administered encounter medications on file as of 04/29/2019.     Functional Status:  In your present state of health, do you have any difficulty performing the following activities: 04/29/2019  Hearing? N  Vision? N  Difficulty  concentrating or making decisions? N  Walking or climbing stairs? N  Dressing or bathing? N  Doing errands, shopping? N  Some recent data might be hidden    Fall/Depression Screening: Fall Risk  04/19/2015  Falls in the past year? No   PHQ 2/9 Scores 04/29/2019 04/19/2015  PHQ - 2 Score 0 0    Assessment: Motivated employee of [employer] enrolling in the [place] Management Gestational Diabetes Program to receive self management assistance to control her blood sugar with the personally stated goal of "making sure my son is healthy".  Plan:   [facility] CM Care Plan Problem One     Most Recent Value  Care Plan Problem One  Knowledge deficit related to new diagnosis of gestational diabetes  Role Documenting the Problem One  Care Management Coordinator  Care Plan for Problem One  Active  [facility] Long Term Goal  Patient stated goal " making sure my son is healthy". In the next 90 days patient will demonstrate good understanding of treatment of gestational diabetes as evidenced by: self-monitoring of blood sugars 4 times daily or as prescribed with 90% of blood sugars meeting target, adherence to Plate Method or carb controlled meal plan, completion of assigned [place] gestational modules, attending gestational diabetes class, adherence to provider appointments, adherence in contacting this RNCM at least monthly per program guidelines, delivery of healthy baby with no pre or post-delivery maternal or fetal complications related to gestational diabetes  [facility] Long Term Goal Start Date  04/29/19  Interventions for Problem One Long Term Goal Reviewed [facility] Care Management Gestational Diabetes Program guidelines and benefits, securely e-mailed information packet with explanation of contents to [facility]'s [facility] e-mail address that contains information related to hypoglycemia, symptoms,  rule of 15s for treating hypoglycemia, strategies to treat elevated glucose, plate  method and/or basic carbohydrate counting, the effects of stress on blood sugar and coping strategies. Ensured patient knows how to enroll in the [place] gestational diabetes class, ensured patient has diabetes testing supplies and is comfortable with glucometer operation, instructed patient on how to obtain testing supplies at no cost, and to ask pharmacist for instructions on glucometer use if needed, reviewed the American Diabetes Association recommendations related to frequency of glucose testing and targets, reinforced the importance of keeping provider appointments and attending class(es), encouraged patient to contact this RNCM for questions or concerns related to gestational diabetes self-management.     This RNCM will send today's office visit note to patient'sOBprovider. This RNCM willensure monthly contact with patientand as needed to assist with gestational diabetes self management during her pregnancyand assess patient's progress towardmutually set goals of meeting blood sugar targets and "making sure my son is healthy".   Bary RichardJanet S. Makensie Mulhall RN,CCM,CDE [facility] Care Management Coordinator Office Phone [phone] Office Fax [phone]

## 2019-05-03 ENCOUNTER — Telehealth: Payer: Self-pay | Admitting: *Deleted

## 2019-05-03 ENCOUNTER — Encounter: Payer: Self-pay | Admitting: *Deleted

## 2019-05-03 MED ORDER — METFORMIN HCL 500 MG PO TABS: ORAL_TABLET | ORAL | 6 refills | Status: DC

## 2019-05-03 MED FILL — metFORMIN HCL 500 MG TABS: 500 | 30 days supply | Qty: 60 | Fill #0

## 2019-05-03 NOTE — Telephone Encounter (Signed)
Pt called with her CBG's and is concerned with her fastings to be elevated.  She had been on Metformin 750 but stopped it @ [redacted] weeks GA.  Per Dr Gala Romney she is to restart Metform 500 mg daily for 1 week then increase to 500 mg BID.  Pt will post her CBG's in the [place] app.  Metformin RX sent to [facility] outpatient pharmacy

## 2019-05-18 MED FILL — FREESTYLE LANCETS: 25 days supply | Qty: 100 | Fill #1

## 2019-05-18 MED FILL — FREESTYLE LITE TEST STRIP: 25 days supply | Qty: 100 | Fill #1

## 2019-05-19 ENCOUNTER — Other Ambulatory Visit: Payer: Self-pay | Admitting: *Deleted

## 2019-05-19 NOTE — Patient Outreach (Signed)
[place] [facility]) Care Management  05/19/2019  Dawn Wu 05-03-1986 [id number]   Received returned e-mail from [place]. She states her blood sugars are meeing target 95-98% of the time. She says she was prescribed Metformin 500 mg twice daily.  Plan: Reply email sent thanking [name] for her response and advising her to take her Metformin with meals to decrease GI side effects. Will plan to contact [facility] in August for monthly status update.  Barrington Ellison RN,CCM,CDE [place] Management Coordinator Office Phone [phone] Office Fax [phone]

## 2019-05-19 NOTE — Patient Outreach (Signed)
[facility] [facility]) Care Management  05/19/2019  [name] 07/04/1986 [id number]  Dawn Wu is a member of the [place] Management Gestational Diabetes Program. Her due date is 07/19/19 and she is having a baby boy.  Secure e-mail sent to [facility] e-mail address requesting status of her gestational diabetes.  Plan: Await response from [place].  Barrington Ellison RN,CCM,CDE [place] Management Coordinator Office Phone [phone] Office Fax [phone]

## 2019-05-24 ENCOUNTER — Ambulatory Visit (HOSPITAL_COMMUNITY): Payer: 59

## 2019-05-24 ENCOUNTER — Other Ambulatory Visit: Payer: Self-pay

## 2019-05-24 ENCOUNTER — Other Ambulatory Visit: Payer: Self-pay | Admitting: *Deleted

## 2019-05-24 ENCOUNTER — Ambulatory Visit (HOSPITAL_COMMUNITY)
Admission: RE | Admit: 2019-05-24 | Discharge: 2019-05-24 | Disposition: A | Discharge status: Home / Self Care | Payer: 59 | Source: Ambulatory Visit | Attending: Obstetrics and Gynecology | Admitting: Obstetrics and Gynecology

## 2019-05-24 DIAGNOSIS — Z362 Encounter for other antenatal screening follow-up: Secondary | ICD-10-CM | POA: Insufficient documentation

## 2019-05-24 DIAGNOSIS — O359XX0 Maternal care for (suspected) fetal abnormality and damage, unspecified, not applicable or unspecified: Secondary | ICD-10-CM | POA: Diagnosis not present

## 2019-05-24 DIAGNOSIS — Z3A32 32 weeks gestation of pregnancy: Secondary | ICD-10-CM | POA: Diagnosis not present

## 2019-05-24 DIAGNOSIS — O24415 Gestational diabetes mellitus in pregnancy, controlled by oral hypoglycemic drugs: Secondary | ICD-10-CM

## 2019-05-24 DIAGNOSIS — O99213 Obesity complicating pregnancy, third trimester: Secondary | ICD-10-CM | POA: Diagnosis not present

## 2019-05-24 NOTE — Patient Outreach (Signed)
[facility]) Care Management  05/24/2019  Dawn Wu [birth year] [id number]   Dawn Wu is a member of the [name]. Her due date is 07/19/19.  Subjective: Dawn Wu called this RNCM and stated she is concerned that her glucometer is not providing accurate readings. She is receiving very low blood sugar numbers especially in the morning. She says when she rechecks them, they are significantly higher. She denies symptoms of hypoglycemia.She is questioning the accuracy of her glucometer and would like a  another one. She says she does not have any control solution that would allow her to perform a quality control check.  Plan: After ontacting Dawn Wu, director of [facility] pharmacy, advised Dawn Wu to go by the [facility] OP pharmacy tomorrow and they will perform a quality control check on her meter and if it is defective they will replace it.  Dawn Ellison RN,CCM,CDE [place] Management Coordinator Office Phone [phone] Office Fax [phone]

## 2019-05-25 ENCOUNTER — Ambulatory Visit (INDEPENDENT_AMBULATORY_CARE_PROVIDER_SITE_OTHER): Payer: 59 | Admitting: Certified Nurse Midwife

## 2019-05-25 ENCOUNTER — Encounter: Payer: Self-pay | Admitting: Certified Nurse Midwife

## 2019-05-25 ENCOUNTER — Encounter: Payer: Self-pay | Admitting: *Deleted

## 2019-05-25 ENCOUNTER — Other Ambulatory Visit (HOSPITAL_COMMUNITY): Payer: Self-pay | Admitting: *Deleted

## 2019-05-25 VITALS — BP 127/81 | HR 97 | Wt 181.0 lb

## 2019-05-25 DIAGNOSIS — O24415 Gestational diabetes mellitus in pregnancy, controlled by oral hypoglycemic drugs: Secondary | ICD-10-CM | POA: Diagnosis not present

## 2019-05-25 DIAGNOSIS — Z3A32 32 weeks gestation of pregnancy: Secondary | ICD-10-CM

## 2019-05-25 DIAGNOSIS — N760 Acute vaginitis: Secondary | ICD-10-CM

## 2019-05-25 DIAGNOSIS — B9689 Other specified bacterial agents as the cause of diseases classified elsewhere: Secondary | ICD-10-CM

## 2019-05-25 DIAGNOSIS — Z3480 Encounter for supervision of other normal pregnancy, unspecified trimester: Secondary | ICD-10-CM

## 2019-05-25 MED ORDER — METRONIDAZOLE 500 MG PO TABS: 500.0000 mg | ORAL_TABLET | Freq: Two times a day (BID) | ORAL | 0 refills | Status: DC

## 2019-05-25 MED ORDER — OMEPRAZOLE 20 MG PO CPDR: 20.0000 mg | DELAYED_RELEASE_CAPSULE | Freq: Every day | ORAL | 2 refills | Status: DC

## 2019-05-25 MED FILL — metroNIDAZOLE 500 MG TABS: 500 | 7 days supply | Qty: 14 | Fill #0

## 2019-05-25 MED FILL — OMEPRAZOLE 20 MG CPDR: 20 | 30 days supply | Qty: 30 | Fill #0

## 2019-05-25 NOTE — Progress Notes (Signed)
   PRENATAL VISIT NOTE  Subjective:  Dawn Wu is a 33 y.o. G2P1001 at [redacted]w[redacted]d being seen today for ongoing prenatal care.  She is currently monitored for the following issues for this high-risk pregnancy and has Chest tightness/dyspnea; Allergic rhinitis; Food allergy; Acute sinusitis; Supervision of other normal pregnancy, antepartum; History of PCOS; Obesity affecting pregnancy; Abnormal fetal ultrasound; Gestational diabetes mellitus (GDM) in third trimester; Hepatic steatosis; Large liver; PCOS (polycystic ovarian syndrome); Urticaria, unspecified; Atypical squamous cells of undetermined significance (ASCUS) on Papanicolaou smear of cervix; and Obesity (BMI 30.0-34.9) on their problem list.  Patient reports no complaints.  Contractions: Not present. Vag. Bleeding: None.  Movement: Present. Denies leaking of fluid.   The following portions of the patient's history were reviewed and updated as appropriate: allergies, current medications, past family history, past medical history, past social history, past surgical history and problem list.   Objective:   Vitals:   05/25/19 0926  BP: 127/81  Pulse: 97  Weight: 181 lb (82.1 kg)    Fetal Status: Fetal Heart Rate (bpm): NST Fundal Height: 30 cm Movement: Present     General:  Alert, oriented and cooperative. Patient is in no acute distress.  Skin: Skin is warm and dry. No rash noted.   Cardiovascular: Normal heart rate noted  Respiratory: Normal respiratory effort, no problems with respiration noted  Abdomen: Soft, gravid, appropriate for gestational age.  Pain/Pressure: Absent     Pelvic: Cervical exam deferred        Extremities: Normal range of motion.  Edema: Trace  Mental Status: Normal mood and affect. Normal behavior. Normal judgment and thought content.   Assessment and Plan:  Pregnancy: G2P1001 at [redacted]w[redacted]d 1. Gestational diabetes mellitus (GDM) in third trimester controlled on oral hypoglycemic drug - Patient currently on  Metformin 500 BID  - CBGs: fasting range from 75-89, with two abnormal of 92 and 103 2hr PP range from 84-119, with two abnormal of 125 and 130 - Reviewed antenatal screening with patient, NST completed today, BPP to start next week - NST reactive, baseline 130/ moderate variability/ +accels/ no decelerations   2. Supervision of other normal pregnancy, antepartum - Patient doing well, no complaints - Anticipatory guidance on upcoming appointments and antenatal screening  - patient request Rx for Prilosec  - omeprazole (PRILOSEC) 20 MG capsule; Take 1 capsule (20 mg total) by mouth daily.  Dispense: 30 capsule; Refill: 2  3. Bacterial vaginosis - Patient reports BV is back with white thin discharge and odor  - metroNIDAZOLE (FLAGYL) 500 MG tablet; Take 1 tablet (500 mg total) by mouth 2 (two) times daily.  Dispense: 14 tablet; Refill: 0  Preterm labor symptoms and general obstetric precautions including but not limited to vaginal bleeding, contractions, leaking of fluid and fetal movement were reviewed in detail with the patient. Please refer to After Visit Summary for other counseling recommendations.   Return in about 4 weeks (around 06/22/2019) for ROB/GBS.  Future Appointments  Date Time Provider [place]  05/28/2019 10:00 AM [place] [facility]  05/31/2019  3:00 PM [name] 3 WH-MFCUS [facility]  06/07/2019  3:30 PM [name] 1 WH-MFCUS MFC-US  06/14/2019  3:45 PM [name] 2 WH-MFCUS MFC-US  06/21/2019  3:30 PM [name] 1 WH-MFCUS MFC-US  06/22/2019  8:35 AM [place], Kathie Dike, CNM [facility]    Lajean Manes, CNM

## 2019-05-27 MED FILL — metFORMIN HCL 500 MG TABS: 500 | 30 days supply | Qty: 60 | Fill #1

## 2019-05-28 ENCOUNTER — Other Ambulatory Visit: Payer: Self-pay

## 2019-05-28 ENCOUNTER — Ambulatory Visit (INDEPENDENT_AMBULATORY_CARE_PROVIDER_SITE_OTHER): Payer: 59 | Admitting: *Deleted

## 2019-05-28 VITALS — BP 113/63 | HR 83 | Wt 183.0 lb

## 2019-05-28 DIAGNOSIS — O24415 Gestational diabetes mellitus in pregnancy, controlled by oral hypoglycemic drugs: Secondary | ICD-10-CM | POA: Diagnosis not present

## 2019-05-28 DIAGNOSIS — Z3A32 32 weeks gestation of pregnancy: Secondary | ICD-10-CM

## 2019-05-28 NOTE — Progress Notes (Signed)
Pt is here for NST only.

## 2019-05-31 ENCOUNTER — Other Ambulatory Visit: Payer: Self-pay

## 2019-05-31 ENCOUNTER — Ambulatory Visit (HOSPITAL_COMMUNITY)
Admission: RE | Admit: 2019-05-31 | Discharge: 2019-05-31 | Disposition: A | Discharge status: Home / Self Care | Payer: 59 | Source: Ambulatory Visit | Attending: Obstetrics and Gynecology | Admitting: Obstetrics and Gynecology

## 2019-05-31 ENCOUNTER — Ambulatory Visit (HOSPITAL_COMMUNITY): Payer: 59

## 2019-05-31 DIAGNOSIS — O24415 Gestational diabetes mellitus in pregnancy, controlled by oral hypoglycemic drugs: Secondary | ICD-10-CM | POA: Diagnosis not present

## 2019-05-31 DIAGNOSIS — O359XX0 Maternal care for (suspected) fetal abnormality and damage, unspecified, not applicable or unspecified: Secondary | ICD-10-CM | POA: Diagnosis not present

## 2019-05-31 DIAGNOSIS — O99213 Obesity complicating pregnancy, third trimester: Secondary | ICD-10-CM

## 2019-05-31 DIAGNOSIS — Z3A33 33 weeks gestation of pregnancy: Secondary | ICD-10-CM | POA: Diagnosis not present

## 2019-06-01 ENCOUNTER — Encounter: Payer: Self-pay | Admitting: *Deleted

## 2019-06-07 ENCOUNTER — Ambulatory Visit (HOSPITAL_COMMUNITY)
Admission: RE | Admit: 2019-06-07 | Discharge: 2019-06-07 | Disposition: A | Discharge status: Home / Self Care | Payer: 59 | Source: Ambulatory Visit | Attending: Obstetrics and Gynecology | Admitting: Obstetrics and Gynecology

## 2019-06-07 ENCOUNTER — Other Ambulatory Visit: Payer: Self-pay

## 2019-06-07 ENCOUNTER — Ambulatory Visit (HOSPITAL_COMMUNITY): Payer: 59

## 2019-06-07 DIAGNOSIS — O359XX0 Maternal care for (suspected) fetal abnormality and damage, unspecified, not applicable or unspecified: Secondary | ICD-10-CM | POA: Diagnosis not present

## 2019-06-07 DIAGNOSIS — Z3A34 34 weeks gestation of pregnancy: Secondary | ICD-10-CM

## 2019-06-07 DIAGNOSIS — O99213 Obesity complicating pregnancy, third trimester: Secondary | ICD-10-CM | POA: Diagnosis not present

## 2019-06-07 DIAGNOSIS — O24415 Gestational diabetes mellitus in pregnancy, controlled by oral hypoglycemic drugs: Secondary | ICD-10-CM | POA: Insufficient documentation

## 2019-06-09 ENCOUNTER — Encounter: Payer: Self-pay | Admitting: *Deleted

## 2019-06-09 ENCOUNTER — Telehealth: Payer: Self-pay | Admitting: *Deleted

## 2019-06-09 NOTE — Telephone Encounter (Signed)
Pt sent the last 3 weeks of CBG'S into my chart for review.  I forwarded to Redge Gainer, CNM as she is the next provider this patient will be seeing for PN visit.

## 2019-06-11 ENCOUNTER — Telehealth (INDEPENDENT_AMBULATORY_CARE_PROVIDER_SITE_OTHER): Payer: 59

## 2019-06-11 DIAGNOSIS — Z3A34 34 weeks gestation of pregnancy: Secondary | ICD-10-CM

## 2019-06-11 DIAGNOSIS — O24415 Gestational diabetes mellitus in pregnancy, controlled by oral hypoglycemic drugs: Secondary | ICD-10-CM

## 2019-06-11 DIAGNOSIS — Z3480 Encounter for supervision of other normal pregnancy, unspecified trimester: Secondary | ICD-10-CM

## 2019-06-11 MED ORDER — METFORMIN HCL 500 MG PO TABS: ORAL_TABLET | ORAL | 6 refills | Status: DC

## 2019-06-11 NOTE — Progress Notes (Signed)
   TELEHEALTH VIRTUAL OBSTETRICS VISIT ENCOUNTER NOTE  I connected with Dawn Wu on 06/11/19 at 10:00 AM EDT by telephone at home and verified that I am speaking with the correct person using two identifiers.   I discussed the limitations, risks, security and privacy concerns of performing an evaluation and management service by telephone and the availability of in person appointments. I also discussed with the patient that there may be a patient responsible charge related to this service. The patient expressed understanding and agreed to proceed.  Subjective:  Dawn Wu is a 33 y.o. G2P1001 at [redacted]w[redacted]d being followed for ongoing prenatal care.  She is currently monitored for the following issues for this high-risk pregnancy and has Chest tightness/dyspnea; Allergic rhinitis; Food allergy; Acute sinusitis; Supervision of other normal pregnancy, antepartum; History of PCOS; Obesity affecting pregnancy; Abnormal fetal ultrasound; Gestational diabetes mellitus (GDM) in third trimester; Hepatic steatosis; Large liver; PCOS (polycystic ovarian syndrome); Urticaria, unspecified; Atypical squamous cells of undetermined significance (ASCUS) on Papanicolaou smear of cervix; and Obesity (BMI 30.0-34.9) on their problem list.  Patient reports no complaints. Reports fetal movement. Denies any contractions, bleeding or leaking of fluid.   The following portions of the patient's history were reviewed and updated as appropriate: allergies, current medications, past family history, past medical history, past social history, past surgical history and problem list.   Objective:   General:  Alert, oriented and cooperative.   Mental Status: Normal mood and affect perceived. Normal judgment and thought content.  Rest of physical exam deferred due to type of encounter  Assessment and Plan:  Pregnancy: G2P1001 at [redacted]w[redacted]d 1. Gestational diabetes mellitus (GDM) in third trimester controlled on oral hypoglycemic  drug -Work in appointment to review CBGs. Patient reports increasing CBGs despite oral agent. -Review of CBGs shows fasting are all within normal limits but PP range from 100s-150s. Consulted with Dr. Roselie Awkward- will increase AM dose of metformin to 1000mg  and keep pm dose at 500mg .   Preterm labor symptoms and general obstetric precautions including but not limited to vaginal bleeding, contractions, leaking of fluid and fetal movement were reviewed in detail with the patient.  I discussed the assessment and treatment plan with the patient. The patient was provided an opportunity to ask questions and all were answered. The patient agreed with the plan and demonstrated an understanding of the instructions. The patient was advised to call back or seek an in-person office evaluation/go to [facility] at [facility] for any urgent or concerning symptoms. Please refer to After Visit Summary for other counseling recommendations.   I provided 10 minutes of non-face-to-face time during this encounter.  Return in about 2 weeks (around 06/25/2019) for Return OB visit.  Future Appointments  Date Time Provider [place]  06/11/2019 10:00 AM [place] Colace [facility]  06/14/2019  3:45 PM [name] 2 WH-MFCUS MFC-US  06/21/2019  3:30 PM [name] 1 WH-MFCUS MFC-US  06/22/2019  8:35 AM [place], Kathie Dike, CNM [facility]    Wende Mott, [place] for [employer], [place] [employer]

## 2019-06-11 NOTE — Progress Notes (Signed)
On Metformin 500 mg BID but CBG's are elevated  See CBG's under media tab

## 2019-06-14 ENCOUNTER — Ambulatory Visit (HOSPITAL_COMMUNITY): Payer: 59

## 2019-06-14 ENCOUNTER — Inpatient Hospital Stay (HOSPITAL_COMMUNITY)
Admission: AD | Admit: 2019-06-14 | Discharge: 2019-06-19 | DRG: 806 | Disposition: A | Discharge status: Home / Self Care | Payer: 59 | Attending: Obstetrics & Gynecology | Admitting: Obstetrics & Gynecology

## 2019-06-14 ENCOUNTER — Encounter (HOSPITAL_COMMUNITY): Payer: Self-pay

## 2019-06-14 ENCOUNTER — Other Ambulatory Visit: Payer: Self-pay

## 2019-06-14 ENCOUNTER — Ambulatory Visit (HOSPITAL_BASED_OUTPATIENT_CLINIC_OR_DEPARTMENT_OTHER)
Admission: RE | Admit: 2019-06-14 | Discharge: 2019-06-14 | Disposition: A | Discharge status: Home / Self Care | Payer: 59 | Source: Ambulatory Visit | Attending: Obstetrics and Gynecology | Admitting: Obstetrics and Gynecology

## 2019-06-14 DIAGNOSIS — O9912 Other diseases of the blood and blood-forming organs and certain disorders involving the immune mechanism complicating childbirth: Secondary | ICD-10-CM | POA: Diagnosis present

## 2019-06-14 DIAGNOSIS — Z3A35 35 weeks gestation of pregnancy: Secondary | ICD-10-CM

## 2019-06-14 DIAGNOSIS — O42913 Preterm premature rupture of membranes, unspecified as to length of time between rupture and onset of labor, third trimester: Secondary | ICD-10-CM | POA: Diagnosis not present

## 2019-06-14 DIAGNOSIS — O24415 Gestational diabetes mellitus in pregnancy, controlled by oral hypoglycemic drugs: Secondary | ICD-10-CM

## 2019-06-14 DIAGNOSIS — D696 Thrombocytopenia, unspecified: Secondary | ICD-10-CM

## 2019-06-14 DIAGNOSIS — O3660X0 Maternal care for excessive fetal growth, unspecified trimester, not applicable or unspecified: Secondary | ICD-10-CM

## 2019-06-14 DIAGNOSIS — O99214 Obesity complicating childbirth: Secondary | ICD-10-CM | POA: Diagnosis present

## 2019-06-14 DIAGNOSIS — O2662 Liver and biliary tract disorders in childbirth: Secondary | ICD-10-CM | POA: Diagnosis present

## 2019-06-14 DIAGNOSIS — O0990 Supervision of high risk pregnancy, unspecified, unspecified trimester: Secondary | ICD-10-CM

## 2019-06-14 DIAGNOSIS — O99113 Other diseases of the blood and blood-forming organs and certain disorders involving the immune mechanism complicating pregnancy, third trimester: Secondary | ICD-10-CM | POA: Diagnosis not present

## 2019-06-14 DIAGNOSIS — O283 Abnormal ultrasonic finding on antenatal screening of mother: Secondary | ICD-10-CM | POA: Diagnosis present

## 2019-06-14 DIAGNOSIS — E669 Obesity, unspecified: Secondary | ICD-10-CM | POA: Diagnosis present

## 2019-06-14 DIAGNOSIS — O4100X0 Oligohydramnios, unspecified trimester, not applicable or unspecified: Secondary | ICD-10-CM

## 2019-06-14 DIAGNOSIS — Z20828 Contact with and (suspected) exposure to other viral communicable diseases: Secondary | ICD-10-CM | POA: Diagnosis present

## 2019-06-14 DIAGNOSIS — K760 Fatty (change of) liver, not elsewhere classified: Secondary | ICD-10-CM | POA: Diagnosis present

## 2019-06-14 DIAGNOSIS — Z3689 Encounter for other specified antenatal screening: Secondary | ICD-10-CM

## 2019-06-14 DIAGNOSIS — O4103X0 Oligohydramnios, third trimester, not applicable or unspecified: Principal | ICD-10-CM | POA: Diagnosis present

## 2019-06-14 DIAGNOSIS — O368130 Decreased fetal movements, third trimester, not applicable or unspecified: Secondary | ICD-10-CM

## 2019-06-14 DIAGNOSIS — O24419 Gestational diabetes mellitus in pregnancy, unspecified control: Secondary | ICD-10-CM | POA: Diagnosis present

## 2019-06-14 DIAGNOSIS — O359XX0 Maternal care for (suspected) fetal abnormality and damage, unspecified, not applicable or unspecified: Secondary | ICD-10-CM

## 2019-06-14 DIAGNOSIS — O99210 Obesity complicating pregnancy, unspecified trimester: Secondary | ICD-10-CM | POA: Diagnosis present

## 2019-06-14 DIAGNOSIS — O99213 Obesity complicating pregnancy, third trimester: Secondary | ICD-10-CM

## 2019-06-14 DIAGNOSIS — O3663X0 Maternal care for excessive fetal growth, third trimester, not applicable or unspecified: Secondary | ICD-10-CM | POA: Diagnosis not present

## 2019-06-14 DIAGNOSIS — O42013 Preterm premature rupture of membranes, onset of labor within 24 hours of rupture, third trimester: Secondary | ICD-10-CM | POA: Diagnosis not present

## 2019-06-14 DIAGNOSIS — Z8759 Personal history of other complications of pregnancy, childbirth and the puerperium: Secondary | ICD-10-CM

## 2019-06-14 DIAGNOSIS — O26893 Other specified pregnancy related conditions, third trimester: Secondary | ICD-10-CM | POA: Diagnosis not present

## 2019-06-14 DIAGNOSIS — D6959 Other secondary thrombocytopenia: Secondary | ICD-10-CM | POA: Diagnosis present

## 2019-06-14 DIAGNOSIS — O24425 Gestational diabetes mellitus in childbirth, controlled by oral hypoglycemic drugs: Secondary | ICD-10-CM | POA: Diagnosis present

## 2019-06-14 DIAGNOSIS — O99119 Other diseases of the blood and blood-forming organs and certain disorders involving the immune mechanism complicating pregnancy, unspecified trimester: Secondary | ICD-10-CM | POA: Diagnosis present

## 2019-06-14 DIAGNOSIS — N898 Other specified noninflammatory disorders of vagina: Secondary | ICD-10-CM

## 2019-06-14 DIAGNOSIS — Z3480 Encounter for supervision of other normal pregnancy, unspecified trimester: Secondary | ICD-10-CM

## 2019-06-14 LAB — CBC
HCT: 38.1 % (ref 36.0–46.0)
Hemoglobin: 12.4 g/dL (ref 12.0–15.0)
MCH: 26.2 pg (ref 26.0–34.0)
MCHC: 32.5 g/dL (ref 30.0–36.0)
MCV: 80.5 fL (ref 80.0–100.0)
Platelets: 142 10*3/uL — ABNORMAL LOW (ref 150–400)
RBC: 4.73 MIL/uL (ref 3.87–5.11)
RDW: 14.4 % (ref 11.5–15.5)
WBC: 9.7 10*3/uL (ref 4.0–10.5)
nRBC: 0 % (ref 0.0–0.2)

## 2019-06-14 LAB — POCT FERN TEST: POCT Fern Test: NEGATIVE

## 2019-06-14 LAB — TYPE AND SCREEN
ABO/RH(D): O POS
Antibody Screen: NEGATIVE

## 2019-06-14 LAB — SARS CORONAVIRUS 2 BY RT PCR (HOSPITAL ORDER, PERFORMED IN ~~LOC~~ HOSPITAL LAB): SARS Coronavirus 2: NEGATIVE

## 2019-06-14 LAB — AMNISURE RUPTURE OF MEMBRANE (ROM) NOT AT ARMC: Amnisure ROM: NEGATIVE

## 2019-06-14 MED ORDER — ACETAMINOPHEN 325 MG PO TABS
650.0000 mg | ORAL_TABLET | ORAL | Status: DC | PRN
  Administered 2019-06-15: 650 mg via ORAL
  Filled 2019-06-14: qty 2

## 2019-06-14 MED ORDER — MOMETASONE FURO-FORMOTEROL FUM 200-5 MCG/ACT IN AERO
2.0000 | INHALATION_SPRAY | Freq: Two times a day (BID) | RESPIRATORY_TRACT | Status: DC
  Filled 2019-06-14: qty 8.8

## 2019-06-14 MED ORDER — FAMOTIDINE 20 MG PO TABS
20.0000 mg | ORAL_TABLET | Freq: Two times a day (BID) | ORAL | Status: DC
  Administered 2019-06-15 – 2019-06-16 (×3): 20 mg via ORAL
  Filled 2019-06-14 (×4): qty 1

## 2019-06-14 MED ORDER — ALBUTEROL SULFATE (2.5 MG/3ML) 0.083% IN NEBU: 3.0000 mL | INHALATION_SOLUTION | Freq: Four times a day (QID) | RESPIRATORY_TRACT | Status: DC | PRN

## 2019-06-14 MED ORDER — DOCUSATE SODIUM 100 MG PO CAPS: 100.0000 mg | ORAL_CAPSULE | Freq: Two times a day (BID) | ORAL | Status: DC | PRN

## 2019-06-14 MED ORDER — BETAMETHASONE SOD PHOS & ACET 6 (3-3) MG/ML IJ SUSP
12.0000 mg | INTRAMUSCULAR | Status: AC
  Administered 2019-06-14 – 2019-06-15 (×2): 12 mg via INTRAMUSCULAR
  Filled 2019-06-14 (×2): qty 2

## 2019-06-14 MED ORDER — ZOLPIDEM TARTRATE 5 MG PO TABS: 5.0000 mg | ORAL_TABLET | Freq: Every evening | ORAL | Status: DC | PRN

## 2019-06-14 MED ORDER — PRENATAL MULTIVITAMIN CH
1.0000 | ORAL_TABLET | Freq: Every day | ORAL | Status: DC
  Administered 2019-06-15 – 2019-06-16 (×2): 1 via ORAL
  Filled 2019-06-14 (×2): qty 1

## 2019-06-14 MED ORDER — LACTATED RINGERS IV BOLUS
1000.0000 mL | Freq: Once | INTRAVENOUS | Status: AC
  Administered 2019-06-14: 1000 mL via INTRAVENOUS

## 2019-06-14 MED ORDER — METFORMIN HCL 500 MG PO TABS
500.0000 mg | ORAL_TABLET | Freq: Every day | ORAL | Status: DC
  Administered 2019-06-14 – 2019-06-15 (×2): 500 mg via ORAL
  Filled 2019-06-14 (×2): qty 1

## 2019-06-14 MED ORDER — METFORMIN HCL 500 MG PO TABS
1000.0000 mg | ORAL_TABLET | Freq: Every day | ORAL | Status: DC
  Administered 2019-06-15 – 2019-06-16 (×2): 1000 mg via ORAL
  Filled 2019-06-14 (×2): qty 2

## 2019-06-14 MED ORDER — METFORMIN HCL 500 MG PO TABS: 500.0000 mg | ORAL_TABLET | Freq: Every day | ORAL | Status: DC

## 2019-06-14 NOTE — MAU Provider Note (Signed)
History     CSN: [id number]  Arrival date and time: 06/14/19 1655   First Provider Initiated Contact with Patient 06/14/19 1747      Chief Complaint  Patient presents with  . Rupture of Membranes   Ms. Kathyann Spaugh is a 33 y.o. G2P1001 at 6w0dwho presents to MAU for possible ROM. Pt was seen by MFM today and diagnosed with oligohydramnios and reported possible LOF since last week and was sent to MAU for further evaluation.  Onset: 06/09/2019 Location: vagina Duration: 5days Character: watery vaginal discharge, pt reports last Wednesday she experienced a big gush of fluid while she was in the shower, pt reports she did not call her OB, pt reports underwear are "more wet" and has had several days over the weekend where she needed to change her underwear multiple times, pt reports clear, odorless fluid Aggravating/Associated: none/none Relieving: none Treatment: none  Pt reports irregular contractions. Pt also reports the baby has been "sluggish" for the past two days.  Pt denies VB, odor/itching. Pt denies N/V, abdominal pain, constipation, diarrhea, or urinary problems. Pt denies fever, chills, fatigue, sweating or changes in appetite. Pt denies SOB or chest pain. Pt denies dizziness, HA, light-headedness, weakness.  Problems this pregnancy include: oligo, GDM - on Metformin. Allergies? Shellfish, NuvaRing Current medications/supplements? Metformin 10037min AM, 50077mM, PNVs, Omeprazole 26m46mily, Allegra PRN Prenatal care provider? [facility] [place], next appt 06/22/2019   OB History    Gravida  2   Para  1   Term  1   Preterm      AB      Living  1     SAB      TAB      Ectopic      Multiple  0   Live Births  1           Past Medical History:  Diagnosis Date  . Allergies   . Headache   . Hyperlipidemia   . Infertility management   . PCOS (polycystic ovarian syndrome)   . Plantar fasciitis 01/28/2014    Past Surgical History:   Procedure Laterality Date  . CHOLECYSTECTOMY N/A 06/24/2017   Procedure: LAPAROSCOPIC CHOLECYSTECTOMY;  Surgeon: ConnClovis Riley;  Location: [facility];  Service: General;  Laterality: N/A;  . WISDOM TOOTH EXTRACTION      Family History  Problem Relation Age of Onset  . Diabetes Mother   . Hyperlipidemia Mother   . Diabetes Father   . Hyperlipidemia Father   . Cancer Maternal Aunt        uterine/Ovarian    Social History   Tobacco Use  . Smoking status: Never Smoker  . Smokeless tobacco: Never Used  Substance Use Topics  . Alcohol use: Yes    Comment: occ  . Drug use: No    Allergies:  Allergies  Allergen Reactions  . Shellfish Allergy Anaphylaxis  . Nuvaring [Etonogestrel-Ethinyl Estradiol] Hives    Medications Prior to Admission  Medication Sig Dispense Refill Last Dose  . metFORMIN (GLUCOPHAGE) 500 MG tablet Take 1 PO daily for 1 week then 1 PO BID 60 tablet 6 06/14/2019 at Unknown time  . Prenatal Vit-Fe Fumarate-FA (PRENATAL VITAMINS PO) Take by mouth.   06/14/2019 at Unknown time  . Accu-Chek FastClix Lancets MISC 1 Device by Percutaneous route 4 (four) times daily. 100 each 12   . acetaminophen (TYLENOL) 500 MG tablet Take 500 mg by mouth every 8 (eight) hours as needed for mild  pain or moderate pain.     Marland Kitchen albuterol (PROVENTIL HFA;VENTOLIN HFA) 108 (90 Base) MCG/ACT inhaler Inhale 1-2 puffs into the lungs every 6 (six) hours as needed for wheezing or shortness of breath. 18 g 0   . Blood Glucose Monitoring Suppl (ACCU-CHEK GUIDE) w/Device KIT 1 kit by Subdermal route 4 (four) times daily. Check blood sugars for fasting, and two hours after breakfast, lunch and dinner (4 checks) 1 kit 0   . budesonide-formoterol (SYMBICORT) 160-4.5 MCG/ACT inhaler Inhale 2 puffs into the lungs 2 (two) times daily for 30 days. 1 Inhaler 5   . cetirizine (ZYRTEC) 10 MG tablet Take 10 mg by mouth daily as needed for allergies.      . Doxylamine-Pyridoxine 10-10 MG TBEC      .  famotidine (PEPCID) 20 MG tablet Take 1 tablet (20 mg total) by mouth 2 (two) times daily. 60 tablet 3   . glucose blood test strip Use as instructed 100 each 12   . Olopatadine HCl (PATADAY) 0.2 % SOLN Place 1 drop into both eyes 1 day or 1 dose. 1 Bottle 5   . omeprazole (PRILOSEC) 20 MG capsule Take 1 capsule (20 mg total) by mouth daily. 30 capsule 2   . UNABLE TO FIND every 7 (seven) days. Allergy injection on Tuesday at Dr office       Review of Systems  Constitutional: Negative for chills, diaphoresis, fatigue and fever.  Respiratory: Negative for shortness of breath.   Cardiovascular: Negative for chest pain.  Gastrointestinal: Negative for abdominal pain, constipation, diarrhea, nausea and vomiting.  Genitourinary: Positive for vaginal discharge. Negative for dysuria, flank pain, frequency, pelvic pain, urgency and vaginal bleeding.  Neurological: Negative for dizziness, weakness, light-headedness and headaches.   Physical Exam   Blood pressure 137/78, pulse 80, temperature 98.5 F (36.9 C), temperature source Oral, resp. rate 18, weight 84.4 kg, last menstrual period 10/12/2018, SpO2 99 %.  Patient Vitals for the past 24 hrs:  BP Temp Temp src Pulse Resp SpO2 Weight  06/14/19 1731 137/78 - - 80 - - -  06/14/19 1716 127/73 98.5 F (36.9 C) Oral 80 18 99 % 84.4 kg   Physical Exam  Constitutional: She is oriented to person, place, and time. She appears well-developed and well-nourished. No distress.  HENT:  Head: Normocephalic and atraumatic.  Respiratory: Effort normal.  GI: Soft. She exhibits no distension and no mass. There is no abdominal tenderness. There is no rebound and no guarding.  Genitourinary: There is no rash, tenderness or lesion on the right labia. There is no rash, tenderness or lesion on the left labia.    Vaginal discharge (scant discharge with mucus) present.     No vaginal tenderness or bleeding.  No tenderness or bleeding in the vagina.    Genitourinary  Comments: On SSE: no pooling of fluid, no fluid leaking from external os with or without Valsalva. CE: 1/long/posterior   Neurological: She is alert and oriented to person, place, and time.  Skin: Skin is warm and dry. She is not diaphoretic.  Psychiatric: She has a normal mood and affect. Her behavior is normal. Judgment and thought content normal.   Results for orders placed or performed during the hospital encounter of 06/14/19 (from the past 24 hour(s))  Fern Test     Status: Normal   Collection Time: 06/14/19  6:41 PM  Result Value Ref Range   POCT Fern Test Negative = intact amniotic membranes   Amnisure rupture of  membrane (rom)not at [facility]     Status: None   Collection Time: 06/14/19  7:05 PM  Result Value Ref Range   Amnisure ROM NEGATIVE    [name] Mfm Fetal Bpp Wo Non Stress  Result Date: 06/14/2019 ----------------------------------------------------------------------  OBSTETRICS REPORT                       (Signed Final 06/14/2019 04:55 pm) ---------------------------------------------------------------------- Patient Info  ID #:       [id number]                          D.O.B.:  10-25-1986 (33 yrs)  Name:       Orlene Erm                 Visit Date: 06/14/2019 04:00 pm ---------------------------------------------------------------------- Performed By  Performed By:     Rodrigo Ran BS      Ref. Address:     [address]                    [place]                                                             [place], [place]  Attending:        Tama High MD        [facility]  Referred By:      [place] ---------------------------------------------------------------------- Orders   #  Description                          Code          Ordered By   1  [name] MFM FETAL BPP WO NON              63817.71     RAVI SHANKAR      STRESS  ----------------------------------------------------------------------   #  Order #  Accession #                 Episode #   1  [id number]                  [id number]                  [id number]  ---------------------------------------------------------------------- Indications   Encounter for other antenatal screening        Z36.2   follow-up   Gestational diabetes in pregnancy,             O24.415   controlled by oral hypoglycemic drugs   Fetal abnormality - other known or             O35.9XX0   suspected (thickened nuchal fold) (Neg AFP,   low risk NIPs)   Obesity complicating pregnancy, third          O99.213   trimester   Leakage of amniotic fluid                      O42.90   Oligohydramnios / Decreased amniotic fluid     O41.00X0   volume   [redacted] weeks gestation of pregnancy                Z3A.35  ---------------------------------------------------------------------- Vital Signs                                                 Height:        5'2" ---------------------------------------------------------------------- Fetal Evaluation  Num Of Fetuses:         1  Fetal Heart Rate(bpm):  138  Cardiac Activity:       Observed  Presentation:           Cephalic  Amniotic Fluid  AFI FV:      Oligohydramnios  AFI Sum(cm)     %Tile       Largest Pocket(cm)  3.91            < 3         2.41  RUQ(cm)       RLQ(cm)       LUQ(cm)        LLQ(cm)  2.41          0             0              1.5 ---------------------------------------------------------------------- Biophysical Evaluation  Amniotic F.V:   Pocket => 2 cm two         F. Tone:        Observed                  planes  F. Movement:    Observed                   Score:          8/8  F. Breathing:   Observed ---------------------------------------------------------------------- OB History  Gravidity:    2         Term:   1  Living:       1  ---------------------------------------------------------------------- Gestational Age  LMP:           35w 0d        Date:  10/12/18  EDD:   07/19/19  Best:          Barbie Haggis 0d     Det. By:  LMP  (10/12/18)          EDD:   07/19/19 ---------------------------------------------------------------------- Impression  Gestational diabetes. Patient takes metformin for control. She  gives history of leakage of amniotic fluid for 2 days.  On ultrasound, oligohydramnios is seen. Antenatal testing is  reassuring. BPP 8/8.  I advised the patient to go to the MAU for evaluation.  Discussed with MAU team. ---------------------------------------------------------------------- Recommendations  -MAU evaluation.  -If PPROM is confirmed, option of delivery now versus at 37  weeks may be discussed. ----------------------------------------------------------------------                  Tama High, MD Electronically Signed Final Report   06/14/2019 04:55 pm ----------------------------------------------------------------------  [name] Mfm Fetal Bpp Wo Non Stress  Result Date: 06/08/2019 ----------------------------------------------------------------------  OBSTETRICS REPORT                        (Signed Final 06/08/2019 12:29 am) ---------------------------------------------------------------------- Patient Info  ID #:       [id number]                          D.O.B.:  04/30/1986 (33 yrs)  Name:       Orlene Erm                 Visit Date: 06/07/2019 04:17 pm ---------------------------------------------------------------------- Performed By  Performed By:     Hubert Azure          Ref. Address:      [address]                    [place], [place]  Attending:        Sander Nephew      [facility]                    MD                                        [facility]  Referred By:  MELANIE N                    BHAMBRI CNM ---------------------------------------------------------------------- Orders   #  Description                          Code         Ordered By   1  [name] MFM FETAL BPP WO NON              76819.01     RAVI [facility]      STRESS  ----------------------------------------------------------------------   #  Order #                    Accession #                 Episode #   1  [id number]                  [id number]                  [id number]  ---------------------------------------------------------------------- Indications   Gestational diabetes in pregnancy,             O24.415   controlled by oral hypoglycemic drugs   Fetal abnormality - other known or             O35.9XX0   suspected (thickened nuchal fold) (Neg AFP,   low risk NIPs)   Obesity complicating pregnancy, third          O99.213   trimester   [redacted] weeks gestation of pregnancy                Z3A.34  ---------------------------------------------------------------------- Vital Signs                                                 Height:        5'2" ---------------------------------------------------------------------- Fetal Evaluation  Num Of Fetuses:          1  Fetal Heart Rate(bpm):   134  Cardiac Activity:        Observed  Presentation:            Cephalic  Placenta:                Anterior  P. Cord Insertion:       Visualized  Amniotic Fluid  AFI FV:      Within normal limits  AFI Sum(cm)     %Tile       Largest Pocket(cm)  11.07           27          5.26  RUQ(cm)       RLQ(cm)       LUQ(cm)        LLQ(cm)  5.26          2.02          2.69           1.1 ---------------------------------------------------------------------- Biophysical Evaluation  Amniotic F.V:   Within normal limits       F. Tone:         Observed  F. Movement:    Observed                   Score:  8/8  F. Breathing:   Observed  ---------------------------------------------------------------------- OB History  Gravidity:    2         Term:   1  Living:       1 ---------------------------------------------------------------------- Gestational Age  LMP:           34w 0d        Date:  10/12/18                 EDD:   07/19/19  Best:          34w 0d     Det. By:  LMP  (10/12/18)          EDD:   07/19/19 ---------------------------------------------------------------------- Impression  Biophysical profile 8/8  A2GDM ---------------------------------------------------------------------- Recommendations  Repeat BPP on 8/10 ----------------------------------------------------------------------               Sander Nephew, MD Electronically Signed Final Report   06/08/2019 12:29 am ----------------------------------------------------------------------  [name] Mfm Fetal Bpp Wo Non Stress  Result Date: 06/01/2019 ----------------------------------------------------------------------  OBSTETRICS REPORT                        (Signed Final 06/01/2019 08:18 am) ---------------------------------------------------------------------- Patient Info  ID #:       [id number]                          D.O.B.:  07/11/1986 (33 yrs)  Name:       Orlene Erm                 Visit Date: 05/31/2019 03:16 pm ---------------------------------------------------------------------- Performed By  Performed By:     Rodrigo Ran BS      Ref. Address:      [address]                    [place]                                                              [place], [place]  Attending:        Sander Nephew      Location[facility]                    MD  [facility]etal [facility]  Referred By:      Graciela Husbands CNM  ---------------------------------------------------------------------- Orders   #  Description                          Code         Ordered By   1  [name] MFM FETAL BPP WO NON              76819.01     RAVI [facility]      STRESS  ----------------------------------------------------------------------   #  Order #                    Accession #                 Episode #   1  [id number]                  [id number]                  [id number]  ---------------------------------------------------------------------- Indications   Encounter for other antenatal screening        Z36.2   follow-up   Gestational diabetes in pregnancy,             O24.415   controlled by oral hypoglycemic drugs   Fetal abnormality - other known or             O35.9XX0   suspected (thickened nuchal fold) (Neg AFP,   low risk NIPs)   Obesity complicating pregnancy, third          O99.213   trimester   [redacted] weeks gestation of pregnancy                Z3A.33  ---------------------------------------------------------------------- Vital Signs                                                 Height:        5'2" ---------------------------------------------------------------------- Fetal Evaluation  Num Of Fetuses:          1  Fetal Heart Rate(bpm):   138  Cardiac Activity:        Observed  Presentation:            Cephalic  Amniotic Fluid  AFI FV:      Within normal limits  AFI Sum(cm)     %Tile       Largest Pocket(cm)  9.44            14          3.37  RUQ(cm)       RLQ(cm)       LUQ(cm)        LLQ(cm)  3.29          1.22          3.37           1.56 ---------------------------------------------------------------------- Biophysical Evaluation  Amniotic F.V:   Within normal limits       F. Tone:         Observed  F. Movement:    Observed                   Score:  8/8  F. Breathing:   Observed ---------------------------------------------------------------------- OB History  Gravidity:    2         Term:   1  Living:       1  ---------------------------------------------------------------------- Gestational Age  LMP:           33w 0d        Date:  10/12/18                 EDD:   07/19/19  Best:          33w 0d     Det. By:  LMP  (10/12/18)          EDD:   07/19/19 ---------------------------------------------------------------------- Impression  Biophysical profile 8/8  A2GDM ---------------------------------------------------------------------- Recommendations  Continue weekly testing ----------------------------------------------------------------------               Sander Nephew, MD Electronically Signed Final Report   06/01/2019 08:18 am ----------------------------------------------------------------------  [name] Mfm Ob Follow Up  Result Date: 05/24/2019 ----------------------------------------------------------------------  OBSTETRICS REPORT                       (Signed Final 05/24/2019 04:51 pm) ---------------------------------------------------------------------- Patient Info  ID #:       [id number]                          D.O.B.:  1986/05/03 (33 yrs)  Name:       Orlene Erm                 Visit Date: 05/24/2019 03:48 pm ---------------------------------------------------------------------- Performed By  Performed By:     Georgie Chard        Ref. Address:     [address]                                                             [place], [place]  Attending:        Tama High MD        Location[facility]  Referred By:      Graciela Husbands CNM ---------------------------------------------------------------------- Orders   #  Description                          Code         Ordered By   1  [name] MFM OB FOLLOW UP                  53976.73     Sander Nephew  ----------------------------------------------------------------------   #  Order #                    Accession #                 Episode #   1  [id number]                  [id number]                  [id number]  ---------------------------------------------------------------------- Indications   Fetal abnormality - other known or             O35.9XX0   suspected (thickened nuchal fold) (Neg AFP,   low risk NIPs)   Obesity complicating pregnancy, second         O99.212   trimester   Encounter for other antenatal screening        Z36.2   follow-up   [redacted] weeks gestation of pregnancy                Z3A.32   Gestational diabetes in pregnancy,             O24.415   controlled by oral hypoglycemic drugs  ---------------------------------------------------------------------- Vital Signs  Weight (lb): 181                               Height:        5'2"  BMI:         33.1 ---------------------------------------------------------------------- Fetal Evaluation  Num Of Fetuses:         1  Fetal Heart Rate(bpm):  140  Cardiac Activity:       Observed  Presentation:           Cephalic  Placenta:               Anterior  P. Cord Insertion:      Visualized  Amniotic Fluid  AFI FV:      Within normal limits  AFI Sum(cm)     %Tile       Largest Pocket(cm)  12.91           38          4.17  RUQ(cm)       RLQ(cm)       LUQ(cm)        LLQ(cm)  4.17          2.68  3.85           2.21 ---------------------------------------------------------------------- Biometry  BPD:      84.9  mm     G. Age:  34w 1d         93  %    CI:        78.08   %    70 - 86                                                          FL/HC:      22.4   %    19.1 - 21.3  HC:       304   mm     G. Age:  33w 6d         62  %    HC/AC:      1.01        0.96 - 1.17  AC:      301.2  mm     G. Age:  34w 1d         94  %    FL/BPD:     80.1   %    71 - 87  FL:         68  mm     G. Age:  35w 0d         96   %    FL/AC:      22.6   %    20 - 24  HUM:      57.5  mm     G. Age:  33w 2d         78  %  LV:        4.7  mm  Est. FW:    2398  gm      5 lb 5 oz     96  % ---------------------------------------------------------------------- OB History  Gravidity:    2         Term:   1  Living:       1 ---------------------------------------------------------------------- Gestational Age  LMP:           32w 0d        Date:  10/12/18                 EDD:   07/19/19  U/S Today:     34w 2d                                        EDD:   07/03/19  Best:          32w 0d     Det. By:  LMP  (10/12/18)          EDD:   07/19/19 ---------------------------------------------------------------------- Anatomy  Cranium:               Appears normal         Aortic Arch:            Previously seen  Cavum:                 Appears normal         Ductal Arch:  Previously seen  Ventricles:            Appears normal         Diaphragm:              Appears normal  Choroid Plexus:        Previously seen        Stomach:                Appears normal, left                                                                        sided  Cerebellum:            Previously seen        Abdomen:                Appears normal  Posterior Fossa:       Previously seen        Abdominal Wall:         Previously seen  Nuchal Fold:           Previously seen        Cord Vessels:           Not well visualized  Face:                  Orbits and profile     Kidneys:                Previously seen                         previously seen  Lips:                  Previously seen        Bladder:                Previously seen  Thoracic:              Appears normal         Spine:                  Previously seen  Heart:                 Appears normal         Upper Extremities:      Previously seen                         (4CH, axis, and                         situs)  RVOT:                  Previously seen        Lower Extremities:      Previously seen  LVOT:                   Previously seen  Other:  Parents do not wish to know sex of fetus. Heels previously seen.          Nasal bone previously seen. Technically difficult due to fetal  position. ---------------------------------------------------------------------- Impression  The estimated fetal weight is at the 96th percentile. Amniotic  fluid is normal and good fetal activity is seen.  Patient takes metformin for control of gestational diabetes.  She reports her blood glucose levels are within normal range. ---------------------------------------------------------------------- Recommendations  -Continue weekly BPP till delivery. ----------------------------------------------------------------------                  Tama High, MD Electronically Signed Final Report   05/24/2019 04:51 pm ----------------------------------------------------------------------  MAU Course  Procedures  MDM -r/o ROM + oligo per MFM -pt also reporting DFM X2JJHE -pregnancy complicated by GDM, on Metformin -on SSE: no pooling of fluid, no leaking of fluid from external os with or without Valsalva -Fern: negative -AmniSure: negative -called and spoke with Dr. Ilda Basset _0 , will consult with MFM re: patient presentation and return call to MAU with more information -Dr. Ilda Basset called back to MAU, will admit to [facility] for observation -EFM: reactive       -baseline: 140/130/120       -variability: minimal/moderate       -accels: present, 15x15       -decels: few variable       -TOCO: irritability, irregular ctx -admit to [facility] for observation  Orders Placed This Encounter  Procedures  . SARS Coronavirus 2 [facility] order, Performed in [facility] hospital lab) Nasopharyngeal Nasopharyngeal Swab    Standing Status:   Standing    Number of Occurrences:   1    Order Specific Question:   Is this test for diagnosis or screening    Answer:   Screening    Order Specific Question:   Symptomatic for COVID-19 as  defined by CDC    Answer:   No    Order Specific Question:   Hospitalized for COVID-19    Answer:   No    Order Specific Question:   Admitted to ICU for COVID-19    Answer:   No    Order Specific Question:   Previously tested for COVID-19    Answer:   No    Order Specific Question:   Resident in a congregate (group) care setting    Answer:   No    Order Specific Question:   Employed in healthcare setting    Answer:   Unknown    Order Specific Question:   Pregnant    Answer:   Yes  . Amnisure rupture of membrane (rom)not at [facility]    Standing Status:   Standing    Number of Occurrences:   1  . CBC    Standing Status:   Standing    Number of Occurrences:   1  . Diet gestational carb mod Room service appropriate? Yes; Fluid consistency: Thin    Standing Status:   Standing    Number of Occurrences:   1    Order Specific Question:   Room service appropriate?    Answer:   Yes    Order Specific Question:   Fluid consistency:    Answer:   Thin  . Notify physician (specify)    Standing Status:   Standing    Number of Occurrences:   20    Order Specific Question:   Notify Physician    Answer:   for pulse less than 60 or greater than 120    Order Specific Question:   Notify Physician    Answer:   for respiratory rate less than 12 or greater than 28  Order Specific Question:   Notify Physician    Answer:   for temperature greater than 100.4    Order Specific Question:   Notify Physician    Answer:   for urinary output less than 30 ml/hr    Order Specific Question:   Notify Physician    Answer:   for systolic BP less than 80 or greater than 140    Order Specific Question:   Notify Physician    Answer:   for diastolic BP less than 40 or greater than 90  . Vital signs    While awake, respect sleep.    Standing Status:   Standing    Number of Occurrences:   1  . Defer vaginal exam for vaginal bleeding or PROM <37 weeks    Standing Status:   Standing    Number of Occurrences:   1  .  Initiate Oral Care Protocol    Standing Status:   Standing    Number of Occurrences:   1  . Initiate Carrier Fluid Protocol    Standing Status:   Standing    Number of Occurrences:   1  . SCDs    Standing Status:   Standing    Number of Occurrences:   1    Order Specific Question:   Laterality    Answer:   Bilateral  . Fetal monitoring every shift x 30 minutes    Standing Status:   Standing    Number of Occurrences:   1  . Activity as tolerated    Standing Status:   Standing    Number of Occurrences:   1  . Full code    Standing Status:   Standing    Number of Occurrences:   1  . Airborne and Contact precautions    Standing Status:   Standing    Number of Occurrences:   1  . Fern Test    Standing Status:   Standing    Number of Occurrences:   1  . POCT CBG (Fasting - Glucose)    Standing Status:   Standing    Number of Occurrences:   9  . Type and screen [place]     Standing Status:   Standing    Number of Occurrences:   1  . Insert peripheral IV    Standing Status:   Standing    Number of Occurrences:   1  . Admit to Inpatient (patient's expected length of stay will be greater than 2 midnights or inpatient only procedure)    Standing Status:   Standing    Number of Occurrences:   1    Order Specific Question:   Hospital Area    Answer:   [place] [[id number]]    Order Specific Question:   Level of Care    Answer:   Antepartum [20]    Order Specific Question:   Covid Evaluation    Answer:   Asymptomatic Screening Protocol (No Symptoms)    Order Specific Question:   Diagnosis    Answer:   Oligohydramnios [[id number]]    Order Specific Question:   Admitting Physician    Answer:   Aletha Halim [[id number]]    Order Specific Question:   Attending Physician    Answer:   Aletha Halim [[id number]]    Order Specific Question:   Estimated length of stay    Answer:   past midnight tomorrow  Order Specific  Question:   Certification:    Answer:   I certify this patient will need inpatient services for at least 2 midnights    Order Specific Question:   PT Class (Do Not Modify)    Answer:   Inpatient [101]    Order Specific Question:   PT Acc Code (Do Not Modify)    Answer:   Private [1]   Meds ordered this encounter  Medications  . betamethasone acetate-betamethasone sodium phosphate (CELESTONE) injection 12 mg  . lactated ringers bolus 1,000 mL  . famotidine (PEPCID) tablet 20 mg  . mometasone-formoterol (DULERA) 200-5 MCG/ACT inhaler 2 puff  . albuterol (VENTOLIN HFA) 108 (90 Base) MCG/ACT inhaler 1-2 puff  . metFORMIN (GLUCOPHAGE) tablet 1,000 mg  . metFORMIN (GLUCOPHAGE) tablet 500 mg  . acetaminophen (TYLENOL) tablet 650 mg  . zolpidem (AMBIEN) tablet 5 mg  . docusate sodium (COLACE) capsule 100 mg  . prenatal multivitamin tablet 1 tablet   Assessment and Plan   1. Vaginal discharge during pregnancy in third trimester   2. Gestational diabetes mellitus (GDM) in third trimester controlled on oral hypoglycemic drug   3. Abnormal fetal ultrasound   4. Obesity affecting pregnancy in third trimester   5. Supervision of other normal pregnancy, antepartum   6. Decreased fetal movements in third trimester, single or unspecified fetus   7. NST (non-stress test) reactive   8. [redacted] weeks gestation of pregnancy   9. Oligohydramnios in third trimester, single or unspecified fetus    -admit to [facility] for observation  Gerrie Nordmann Nugent 06/14/2019, 9:06 PM

## 2019-06-14 NOTE — MAU Note (Signed)
Sent from MFM, oligo.  Pt reports ? Leaking for about a wk.  Passed mucous plug last wk, now watery. No bleeding.  Infrequent, irregular ctxs.

## 2019-06-14 NOTE — H&P (Signed)
Obstetrics Admission History & Physical  06/14/2019 - 8:48 PM Primary OBGYN: [facility]  Chief Complaint: oligo  History of Present Illness  33 y.o. G2P1001 @ [redacted]w[redacted]d with the above CC. Pregnancy complicated by: GDMA2, BMI 30s  Ms. Dawn Wu seen for bpp today and had afi of 3 but with a 2x2 pocket. Patient giving history of potential PPROM so sent to MAU to for eval.  Here FMaryann Alarand amnisure negative.   Review of Systems: as noted in the History of Present Illness.  Patient Active Problem List   Diagnosis Date Noted  . Oligohydramnios 06/14/2019  . Gestational diabetes mellitus (GDM) in third trimester 04/28/2019  . Abnormal fetal ultrasound 02/22/2019  . History of PCOS 01/22/2019  . Obesity affecting pregnancy 01/22/2019  . Supervision of other normal pregnancy, antepartum 01/20/2019  . Acute sinusitis 01/19/2018  . PCOS (polycystic ovarian syndrome) 08/15/2017  . Hepatic steatosis 06/04/2017  . Large liver 06/04/2017  . Chest tightness/dyspnea 11/25/2016  . Allergic rhinitis 11/25/2016  . Food allergy 11/25/2016  . Atypical squamous cells of undetermined significance (ASCUS) on Papanicolaou smear of cervix 01/22/2016  . Urticaria, unspecified 04/07/2014  . Obesity (BMI 30.0-34.9) 04/07/2014      PMHx:  Past Medical History:  Diagnosis Date  . Allergies   . Headache   . Hyperlipidemia   . Infertility management   . PCOS (polycystic ovarian syndrome)   . Plantar fasciitis 01/28/2014   PSHx:  Past Surgical History:  Procedure Laterality Date  . CHOLECYSTECTOMY N/A 06/24/2017   Procedure: LAPAROSCOPIC CHOLECYSTECTOMY;  Surgeon: CClovis Riley MD;  Location: [facility]S;  Service: General;  Laterality: N/A;  . WISDOM TOOTH EXTRACTION     Medications:  Medications Prior to Admission  Medication Sig Dispense Refill Last Dose  . metFORMIN (GLUCOPHAGE) 500 MG tablet Take 1 PO daily for 1 week then 1 PO BID 60 tablet 6 06/14/2019 at Unknown time  . Prenatal  Vit-Fe Fumarate-FA (PRENATAL VITAMINS PO) Take by mouth.   06/14/2019 at Unknown time  . Accu-Chek FastClix Lancets MISC 1 Device by Percutaneous route 4 (four) times daily. 100 each 12   . acetaminophen (TYLENOL) 500 MG tablet Take 500 mg by mouth every 8 (eight) hours as needed for mild pain or moderate pain.     .Marland Kitchenalbuterol (PROVENTIL HFA;VENTOLIN HFA) 108 (90 Base) MCG/ACT inhaler Inhale 1-2 puffs into the lungs every 6 (six) hours as needed for wheezing or shortness of breath. 18 g 0   . Blood Glucose Monitoring Suppl (ACCU-CHEK GUIDE) w/Device KIT 1 kit by Subdermal route 4 (four) times daily. Check blood sugars for fasting, and two hours after breakfast, lunch and dinner (4 checks) 1 kit 0   . budesonide-formoterol (SYMBICORT) 160-4.5 MCG/ACT inhaler Inhale 2 puffs into the lungs 2 (two) times daily for 30 days. 1 Inhaler 5   . cetirizine (ZYRTEC) 10 MG tablet Take 10 mg by mouth daily as needed for allergies.      . Doxylamine-Pyridoxine 10-10 MG TBEC      . famotidine (PEPCID) 20 MG tablet Take 1 tablet (20 mg total) by mouth 2 (two) times daily. 60 tablet 3   . glucose blood test strip Use as instructed 100 each 12   . Olopatadine HCl (PATADAY) 0.2 % SOLN Place 1 drop into both eyes 1 day or 1 dose. 1 Bottle 5   . omeprazole (PRILOSEC) 20 MG capsule Take 1 capsule (20 mg total) by mouth daily. 30 capsule 2   .  UNABLE TO FIND every 7 (seven) days. Allergy injection on Tuesday at Dr office        Allergies: is allergic to shellfish allergy and nuvaring [etonogestrel-ethinyl estradiol]. OBHx:  OB History  Gravida Para Term Preterm AB Living  2 1 1     1   SAB TAB Ectopic Multiple Live Births        0 1    # Outcome Date GA Lbr Len/2nd Weight Sex Delivery Anes PTL Lv  2 Current           1 Term 07/20/16 [id number] / 03:02 3350 g M Vag-Spont EPI  LIV          FHx:  Family History  Problem Relation Age of Onset  . Diabetes Mother   . Hyperlipidemia Mother   . Diabetes Father    . Hyperlipidemia Father   . Cancer Maternal Aunt        uterine/Ovarian   Soc Hx:  Social History   Socioeconomic History  . Marital status: Single    Spouse name: Not on file  . Number of children: 1  . Years of education: Not on file  . Highest education level: Not on file  Occupational History  . Occupation: LPN  Social Needs  . Financial resource strain: Not on file  . Food insecurity    Worry: Not on file    Inability: Not on file  . Transportation needs    Medical: Not on file    Non-medical: Not on file  Tobacco Use  . Smoking status: Never Smoker  . Smokeless tobacco: Never Used  Substance and Sexual Activity  . Alcohol use: Yes    Comment: occ  . Drug use: No  . Sexual activity: Yes    Partners: Male    Birth control/protection: None  Lifestyle  . Physical activity    Days per week: Not on file    Minutes per session: Not on file  . Stress: Not on file  Relationships  . Social [name] phone: Not on file    Gets together: Not on file    Attends religious service: Not on file    Active member of club or organization: Not on file    Attends meetings of clubs or organizations: Not on file    Relationship status: Not on file  . Intimate partner violence    Fear of current or ex partner: Not on file    Emotionally abused: Not on file    Physically abused: Not on file    Forced sexual activity: Not on file  Other Topics Concern  . Not on file  Social History Narrative  . Not on file    Objective    Current Vital Signs 24h Vital Sign Ranges  T 98.5 F (36.9 C) Temp  Avg: 98.5 F (36.9 C)  Min: 98.5 F (36.9 C)  Max: 98.5 F (36.9 C)  BP 137/78 BP  Min: 127/73  Max: 137/78  HR 80 Pulse  Avg: 80  Min: 80  Max: 80  RR 18 Resp  Avg: 18  Min: 18  Max: 18  SaO2 99 %   SpO2  Avg: 99 %  Min: 99 %  Max: 99 %       24 Hour I/O Current Shift I/O  Time Ins Outs No intake/output data recorded. No intake/output data recorded.   EFM: 125  baseline, +accels, no decel, mod variability  Toco: quiet  General: Well nourished, well developed female in no acute distress.  Skin:  Warm and dry.  Cardiovascular: S1, S2 normal, no murmur, rub or gallop, regular rate and rhythm Respiratory:  Clear to auscultation bilateral. Normal respiratory effort Abdomen: gravid nttp Neuro/Psych:  Normal mood and affect.    Labs  Negative: fern, amnisure Pending: type and screen CBC Latest Ref Rng & Units 06/14/2019 04/27/2019 05/28/2017  WBC 4.0 - 10.5 K/uL 9.7 9.4 9.4  Hemoglobin 12.0 - 15.0 g/dL 12.4 12.5 13.3  Hematocrit 36.0 - 46.0 % 38.1 37.6 38.6  Platelets 150 - 400 K/uL 142(L) 191 202     Radiology 8/10: ceph, 8/8 bpp with 2x2 pocket, afi 3.9 7/20: efw 96%, 2398gm, AC 94%  Assessment & Plan   33 y.o. G2P1001 @ 51w0dwith oligo. Pt stable *Pregnancy: category I tracing with accels *Oligo: d/w Dr. SDonalee Citrin Rpt bpp on Wednesday. Will give 1L IVF bolus and do bmz course. qshift NSTs *GDMa2: continue home metformin. Am fasting and 2h post prandial checks.  *Analgesia: no current needs *PPx: SCDs, OOB ad lib.  *Dispo: see above  CDurene RomansMD Attending Center for [facility]

## 2019-06-14 NOTE — Plan of Care (Signed)
  Problem: Education: Goal: Knowledge of General Education information will improve Description: Including pain rating scale, medication(s)/side effects and non-pharmacologic comfort measures Outcome: Completed/Met

## 2019-06-15 DIAGNOSIS — O4103X0 Oligohydramnios, third trimester, not applicable or unspecified: Principal | ICD-10-CM

## 2019-06-15 LAB — ABO/RH: ABO/RH(D): O POS

## 2019-06-15 LAB — GLUCOSE, CAPILLARY
Glucose-Capillary: 157 mg/dL — ABNORMAL HIGH (ref 70–99)
Glucose-Capillary: 119 mg/dL — ABNORMAL HIGH (ref 70–99)
Glucose-Capillary: 137 mg/dL — ABNORMAL HIGH (ref 70–99)
Glucose-Capillary: 121 mg/dL — ABNORMAL HIGH (ref 70–99)
Glucose-Capillary: 144 mg/dL — ABNORMAL HIGH (ref 70–99)

## 2019-06-15 NOTE — Progress Notes (Signed)
PT states she hasn't felt baby move for 3 hours.  Monitors applied.  FHR 125.  Continuing to monitor.

## 2019-06-15 NOTE — Progress Notes (Signed)
FACULTY PRACTICE ANTEPARTUM COMPREHENSIVE PROGRESS NOTE  Dawn Wu is a 33 y.o. G2P1001 at [redacted]w[redacted]d who is admitted for Oligohydramnios.  Estimated Date of Delivery: 07/19/19 Fetal presentation is cephalic.  Length of Stay:  1 Days. Admitted 06/14/2019  Subjective: No complaints. Patient reports good fetal movement.  She reports rare uterine contractions, no bleeding and no loss of fluid per vagina.  Vitals:  Blood pressure 110/64, pulse 85, temperature 98.2 F (36.8 C), temperature source Oral, resp. rate 18, height 5' 1.5" (1.562 m), weight 84.4 kg, last menstrual period 10/12/2018, SpO2 97 %. Physical Examination: CONSTITUTIONAL: Well-developed, well-nourished female in no acute distress.  HENT:  Normocephalic, atraumatic, External right and left ear normal. Oropharynx is clear and moist EYES: Conjunctivae and EOM are normal. Pupils are equal, round, and reactive to light. No scleral icterus.  NECK: Normal range of motion, supple, no masses SKIN: Skin is warm and dry. No rash noted. Not diaphoretic. No erythema. No pallor. NEUROLGIC: Alert and oriented to person, place, and time. Normal reflexes, muscle tone coordination. No cranial nerve deficit noted. PSYCHIATRIC: Normal mood and affect. Normal behavior. Normal judgment and thought content. CARDIOVASCULAR: Normal heart rate noted, regular rhythm RESPIRATORY: Effort and breath sounds normal, no problems with respiration noted MUSCULOSKELETAL: Normal range of motion. No edema and no tenderness. 2+ distal pulses. ABDOMEN: Soft, nontender, nondistended, gravid. CERVIX: Dilation: 1 Effacement (%): Thick Cervical Position: Posterior Exam by:: N Nugent, NP  Fetal monitoring: FHR: 125 bpm, Variability: moderate, Accelerations: Present, Decelerations: Absent  Uterine activity: 2-3 contractions per hour  Results for orders placed or performed during the hospital encounter of 06/14/19 (from the past 48 hour(s))  Fern Test     Status:  Normal   Collection Time: 06/14/19  6:41 PM  Result Value Ref Range   POCT Fern Test Negative = intact amniotic membranes   Amnisure rupture of membrane (rom)not at [facility]     Status: None   Collection Time: 06/14/19  7:05 PM  Result Value Ref Range   Amnisure ROM NEGATIVE     Comment: Performed at [facility], [address], [place], [place] [address]  SARS Coronavirus 2 [facility] order, Performed in [facility] hospital lab) Nasopharyngeal Nasopharyngeal Swab     Status: None   Collection Time: 06/14/19  8:43 PM   Specimen: Nasopharyngeal Swab  Result Value Ref Range   SARS Coronavirus 2 NEGATIVE NEGATIVE    Comment: (NOTE) If result is NEGATIVE SARS-CoV-2 target nucleic acids are NOT DETECTED. The SARS-CoV-2 RNA is generally detectable in upper and lower  respiratory specimens during the acute phase of infection. The lowest  concentration of SARS-CoV-2 viral copies this assay can detect is 250  copies / mL. A negative result does not preclude SARS-CoV-2 infection  and should not be used as the sole basis for treatment or other  patient management decisions.  A negative result may occur with  improper specimen collection / handling, submission of specimen other  than nasopharyngeal swab, presence of viral mutation(s) within the  areas targeted by this assay, and inadequate number of viral copies  (<250 copies / mL). A negative result must be combined with clinical  observations, patient history, and epidemiological information. If result is POSITIVE SARS-CoV-2 target nucleic acids are DETECTED. The SARS-CoV-2 RNA is generally detectable in upper and lower  respiratory specimens dur ing the acute phase of infection.  Positive  results are indicative of active infection with SARS-CoV-2.  Clinical  correlation with patient history and  other diagnostic information is  necessary to determine patient infection status.  Positive results do  not rule out bacterial infection or  co-infection with other viruses. If result is PRESUMPTIVE POSTIVE SARS-CoV-2 nucleic acids MAY BE PRESENT.   A presumptive positive result was obtained on the submitted specimen  and confirmed on repeat testing.  While 2019 novel coronavirus  (SARS-CoV-2) nucleic acids may be present in the submitted sample  additional confirmatory testing may be necessary for epidemiological  and / or clinical management purposes  to differentiate between  SARS-CoV-2 and other Sarbecovirus currently known to infect humans.  If clinically indicated additional testing with an alternate test  methodology [id number]) is advised. The SARS-CoV-2 RNA is generally  detectable in upper and lower respiratory sp ecimens during the acute  phase of infection. The expected result is Negative. Fact Sheet for Patients:  BoilerBrush.com.cyhttps://www.fda.gov/media/136312/download Fact Sheet for Healthcare Providers: https://pope.com/https://www.fda.gov/media/136313/download This test is not yet approved or cleared by the [place] FDA and has been authorized for detection and/or diagnosis of SARS-CoV-2 by FDA under an Emergency Use Authorization (EUA).  This EUA will remain in effect (meaning this test can be used) for the duration of the COVID-19 declaration under Section 564(b)(1) of the Act, 21 U.S.C. section 360bbb-3(b)(1), unless the authorization is terminated or revoked sooner. Performed at [facility], [address] [place], [place] [address]   Type and screen [facility]     Status: None   Collection Time: 06/14/19  9:15 PM  Result Value Ref Range   ABO/RH(D) O POS    Antibody Screen NEG    Sample Expiration      06/17/2019,2359 Performed at [facility], [address] [place], [place] [address]   CBC     Status: Abnormal   Collection Time: 06/14/19  9:15 PM  Result Value Ref Range   WBC 9.7 4.0 - 10.5 K/uL   RBC 4.73 3.87 - 5.11 MIL/uL   Hemoglobin 12.4 12.0 - 15.0 g/dL   HCT 47.838.1 29.536.0 - 62.146.0 %    MCV 80.5 80.0 - 100.0 fL   MCH 26.2 26.0 - 34.0 pg   MCHC 32.5 30.0 - 36.0 g/dL   RDW 30.814.4 65.711.5 - 84.615.5 %   Platelets 142 (L) 150 - 400 K/uL    Comment: REPEATED TO VERIFY   nRBC 0.0 0.0 - 0.2 %    Comment: Performed at [facility], [address] [place], [place] [address]  ABO/Rh     Status: None   Collection Time: 06/14/19  9:15 PM  Result Value Ref Range   ABO/RH(D) O POS    No rh immune globuloin      NOT A RH IMMUNE GLOBULIN CANDIDATE, PT RH POSITIVE Performed at [facility], [address] [place], [place] [address]   Glucose, capillary     Status: Abnormal   Collection Time: 06/15/19  1:22 AM  Result Value Ref Range   Glucose-Capillary 157 (H) 70 - 99 mg/dL  Glucose, capillary     Status: Abnormal   Collection Time: 06/15/19  6:45 AM  Result Value Ref Range   Glucose-Capillary 119 (H) 70 - 99 mg/dL  Glucose, capillary     Status: Abnormal   Collection Time: 06/15/19 10:33 AM  Result Value Ref Range   Glucose-Capillary 137 (H) 70 - 99 mg/dL   Comment 1 Notify RN    Comment 2 Document in Chart     [place]  Mfm Fetal Bpp Wo Non Stress  Result Date: 06/14/2019 ----------------------------------------------------------------------  OBSTETRICS REPORT                       (Signed Final 06/14/2019 04:55 pm) ---------------------------------------------------------------------- Patient Info  ID #:       [id number]                          D.O.B.:  [birth year] (33 yrs)  Name:       Orlene Erm                 Visit Date: 06/14/2019 04:00 pm ---------------------------------------------------------------------- Performed By  Performed By:     Rodrigo Ran BS      Ref. Address:     [address]                    [place] [facility]                                                             [place], [place]  Attending:        Tama High MD         [facility]  Referred By:      [place] ---------------------------------------------------------------------- Orders   #  Description                          Code         Ordered By   1  [name] MFM FETAL BPP WO NON              44034.74     RAVI SHANKAR      STRESS  ----------------------------------------------------------------------   #  Order #                    Accession #                 Episode #  1  [id number]                  [phone]                  [id number]  ---------------------------------------------------------------------- Indications   Encounter for other antenatal screening        Z36.2   follow-up   Gestational diabetes in pregnancy,             O24.415   controlled by oral hypoglycemic drugs   Fetal abnormality - other known or             O35.9XX0   suspected (thickened nuchal fold) (Neg AFP,   low risk NIPs)   Obesity complicating pregnancy, third          O99.213   trimester   Leakage of amniotic fluid                      O42.90   Oligohydramnios / Decreased amniotic fluid     O41.00X0   volume   [redacted] weeks gestation of pregnancy                Z3A.35  ---------------------------------------------------------------------- Vital Signs                                                 Height:        5'2" ---------------------------------------------------------------------- Fetal Evaluation  Num Of Fetuses:         1  Fetal Heart Rate(bpm):  138  Cardiac Activity:       Observed  Presentation:           Cephalic  Amniotic Fluid  AFI FV:      Oligohydramnios  AFI Sum(cm)     %Tile       Largest Pocket(cm)  3.91            < 3         2.41  RUQ(cm)       RLQ(cm)       LUQ(cm)        LLQ(cm)  2.41          0             0              1.5 ---------------------------------------------------------------------- Biophysical Evaluation  Amniotic F.V:   Pocket => 2 cm two          F. Tone:        Observed                  planes  F. Movement:    Observed                   Score:          8/8  F. Breathing:   Observed ---------------------------------------------------------------------- OB History  Gravidity:    2         Term:   1  Living:       1 ---------------------------------------------------------------------- Gestational Age  LMP:           35w 0d        Date:  10/12/18                 EDD:   07/19/19  Best:  35w 0d     Det. By:  LMP  (10/12/18)          EDD:   07/19/19 ---------------------------------------------------------------------- Impression  Gestational diabetes. Patient takes metformin for control. She  gives history of leakage of amniotic fluid for 2 days.  On ultrasound, oligohydramnios is seen. Antenatal testing is  reassuring. BPP 8/8.  I advised the patient to go to the MAU for evaluation.  Discussed with MAU team. ---------------------------------------------------------------------- Recommendations  -MAU evaluation.  -If PPROM is confirmed, option of delivery now versus at 37  weeks may be discussed. ----------------------------------------------------------------------                  Noralee Spaceavi Shankar, MD Electronically Signed Final Report   06/14/2019 04:55 pm ----------------------------------------------------------------------   Current scheduled medications . betamethasone acetate-betamethasone sodium phosphate  12 mg Intramuscular Q24 Hr x 2  . famotidine  20 mg Oral BID  . metFORMIN  1,000 mg Oral Q breakfast  . metFORMIN  500 mg Oral Q supper  . mometasone-formoterol  2 puff Inhalation BID  . prenatal multivitamin  1 tablet Oral Q1200    I have reviewed the patient's current medications.  ASSESSMENT: Principal Problem:   Oligohydramnios in third trimester Active Problems:   Supervision of high-risk pregnancy   Obesity affecting pregnancy   Gestational diabetes mellitus (GDM) in third trimester   LGA (large for gestational age)  fetus affecting management of mother   Gestational thrombocytopenia [facility](HCC)   PLAN: Continue inpatient monitoring for now, plan as per MFM is for repeat scan tomorrow and decided on further management. No evidence of PPROM for now, continue to monitor Category I FHR tracing, continue NST BID Blood sugars are slightly elevated due to betamethasone, continue to monitor closely and consider covering with SS. Continue routine antenatal care.   Jaynie CollinsUGONNA  Myrka Sylva, MD, FACOG Obstetrician & Gynecologist, [facility], [facility]

## 2019-06-16 ENCOUNTER — Encounter (HOSPITAL_COMMUNITY): Payer: Self-pay | Admitting: *Deleted

## 2019-06-16 ENCOUNTER — Inpatient Hospital Stay (HOSPITAL_BASED_OUTPATIENT_CLINIC_OR_DEPARTMENT_OTHER): Payer: 59

## 2019-06-16 DIAGNOSIS — O359XX0 Maternal care for (suspected) fetal abnormality and damage, unspecified, not applicable or unspecified: Secondary | ICD-10-CM

## 2019-06-16 DIAGNOSIS — D696 Thrombocytopenia, unspecified: Secondary | ICD-10-CM | POA: Diagnosis not present

## 2019-06-16 DIAGNOSIS — O24415 Gestational diabetes mellitus in pregnancy, controlled by oral hypoglycemic drugs: Secondary | ICD-10-CM

## 2019-06-16 DIAGNOSIS — O99113 Other diseases of the blood and blood-forming organs and certain disorders involving the immune mechanism complicating pregnancy, third trimester: Secondary | ICD-10-CM | POA: Diagnosis not present

## 2019-06-16 DIAGNOSIS — Z3A35 35 weeks gestation of pregnancy: Secondary | ICD-10-CM

## 2019-06-16 DIAGNOSIS — O3663X0 Maternal care for excessive fetal growth, third trimester, not applicable or unspecified: Secondary | ICD-10-CM | POA: Diagnosis not present

## 2019-06-16 DIAGNOSIS — O4103X0 Oligohydramnios, third trimester, not applicable or unspecified: Secondary | ICD-10-CM | POA: Diagnosis not present

## 2019-06-16 DIAGNOSIS — O99213 Obesity complicating pregnancy, third trimester: Secondary | ICD-10-CM | POA: Diagnosis not present

## 2019-06-16 LAB — GLUCOSE, CAPILLARY
Glucose-Capillary: 121 mg/dL — ABNORMAL HIGH (ref 70–99)
Glucose-Capillary: 100 mg/dL — ABNORMAL HIGH (ref 70–99)
Glucose-Capillary: 107 mg/dL — ABNORMAL HIGH (ref 70–99)
Glucose-Capillary: 134 mg/dL — ABNORMAL HIGH (ref 70–99)
Glucose-Capillary: 94 mg/dL (ref 70–99)
Glucose-Capillary: 106 mg/dL — ABNORMAL HIGH (ref 70–99)

## 2019-06-16 LAB — COMPREHENSIVE METABOLIC PANEL
ALT: 27 U/L (ref 0–44)
AST: 26 U/L (ref 15–41)
Albumin: 2.8 g/dL — ABNORMAL LOW (ref 3.5–5.0)
Alkaline Phosphatase: 152 U/L — ABNORMAL HIGH (ref 38–126)
Anion gap: 11 (ref 5–15)
BUN: 13 mg/dL (ref 6–20)
CO2: 17 mmol/L — ABNORMAL LOW (ref 22–32)
Calcium: 8.9 mg/dL (ref 8.9–10.3)
Chloride: 109 mmol/L (ref 98–111)
Creatinine, Ser: 0.77 mg/dL (ref 0.44–1.00)
GFR calc Af Amer: >60 mL/min (ref >60)
GFR calc non Af Amer: >60 mL/min (ref >60)
Glucose, Bld: 98 mg/dL (ref 70–99)
Potassium: 3.9 mmol/L (ref 3.5–5.1)
Sodium: 137 mmol/L (ref 135–145)
Total Bilirubin: 0.4 mg/dL (ref 0.3–1.2)
Total Protein: 6.4 g/dL — ABNORMAL LOW (ref 6.5–8.1)

## 2019-06-16 LAB — CBC
HCT: 36.9 % (ref 36.0–46.0)
Hemoglobin: 12.2 g/dL (ref 12.0–15.0)
MCH: 26.5 pg (ref 26.0–34.0)
MCHC: 33.1 g/dL (ref 30.0–36.0)
MCV: 80.2 fL (ref 80.0–100.0)
Platelets: 129 10*3/uL — ABNORMAL LOW (ref 150–400)
RBC: 4.6 MIL/uL (ref 3.87–5.11)
RDW: 14.6 % (ref 11.5–15.5)
WBC: 11.3 10*3/uL — ABNORMAL HIGH (ref 4.0–10.5)
nRBC: 0 % (ref 0.0–0.2)

## 2019-06-16 MED ORDER — DIPHENHYDRAMINE HCL 50 MG/ML IJ SOLN: 12.5000 mg | INTRAMUSCULAR | Status: DC | PRN

## 2019-06-16 MED ORDER — FLEET ENEMA 7-19 GM/118ML RE ENEM: 1.0000 | ENEMA | Freq: Every day | RECTAL | Status: DC | PRN

## 2019-06-16 MED ORDER — LACTATED RINGERS IV SOLN
500.0000 mL | Freq: Once | INTRAVENOUS | Status: AC
  Administered 2019-06-16: 500 mL via INTRAVENOUS

## 2019-06-16 MED ORDER — SODIUM CHLORIDE 0.9 % IV SOLN
5.0000 10*6.[IU] | Freq: Once | INTRAVENOUS | Status: AC
  Administered 2019-06-16: 11:00:00 5 10*6.[IU] via INTRAVENOUS
  Filled 2019-06-16: qty 5

## 2019-06-16 MED ORDER — ACETAMINOPHEN 325 MG PO TABS: 650.0000 mg | ORAL_TABLET | ORAL | Status: DC | PRN

## 2019-06-16 MED ORDER — TERBUTALINE SULFATE 1 MG/ML IJ SOLN: 0.2500 mg | Freq: Once | INTRAMUSCULAR | Status: DC | PRN

## 2019-06-16 MED ORDER — PHENYLEPHRINE 40 MCG/ML (10ML) SYRINGE FOR IV PUSH (FOR BLOOD PRESSURE SUPPORT)
80.0000 ug | PREFILLED_SYRINGE | INTRAVENOUS | Status: DC | PRN
  Filled 2019-06-16: qty 10

## 2019-06-16 MED ORDER — OXYTOCIN 40 UNITS IN NORMAL SALINE INFUSION - SIMPLE MED: 1.0000 m[IU]/min | INTRAVENOUS | Status: DC

## 2019-06-16 MED ORDER — OXYTOCIN 40 UNITS IN NORMAL SALINE INFUSION - SIMPLE MED
1.0000 m[IU]/min | INTRAVENOUS | Status: DC
  Administered 2019-06-16: 2 m[IU]/min via INTRAVENOUS
  Filled 2019-06-16: qty 1000

## 2019-06-16 MED ORDER — ONDANSETRON HCL 4 MG/2ML IJ SOLN: 4.0000 mg | Freq: Four times a day (QID) | INTRAMUSCULAR | Status: DC | PRN

## 2019-06-16 MED ORDER — FENTANYL CITRATE (PF) 100 MCG/2ML IJ SOLN: 50.0000 ug | INTRAMUSCULAR | Status: DC | PRN

## 2019-06-16 MED ORDER — OXYCODONE-ACETAMINOPHEN 5-325 MG PO TABS: 2.0000 | ORAL_TABLET | ORAL | Status: DC | PRN

## 2019-06-16 MED ORDER — LACTATED RINGERS IV SOLN
INTRAVENOUS | Status: DC
  Administered 2019-06-16 – 2019-06-17 (×3): via INTRAVENOUS

## 2019-06-16 MED ORDER — METFORMIN HCL 500 MG PO TABS
500.0000 mg | ORAL_TABLET | Freq: Two times a day (BID) | ORAL | Status: DC
  Administered 2019-06-16: 500 mg via ORAL
  Filled 2019-06-16 (×3): qty 1

## 2019-06-16 MED ORDER — OXYTOCIN BOLUS FROM INFUSION
500.0000 mL | Freq: Once | INTRAVENOUS | Status: AC
  Administered 2019-06-17: 500 mL via INTRAVENOUS

## 2019-06-16 MED ORDER — MISOPROSTOL 25 MCG QUARTER TABLET
25.0000 ug | ORAL_TABLET | ORAL | Status: DC | PRN
  Administered 2019-06-16 (×2): 25 ug via VAGINAL
  Filled 2019-06-16 (×2): qty 1

## 2019-06-16 MED ORDER — HYDROXYZINE HCL 50 MG PO TABS: 50.0000 mg | ORAL_TABLET | Freq: Four times a day (QID) | ORAL | Status: DC | PRN

## 2019-06-16 MED ORDER — EPHEDRINE 5 MG/ML INJ: 10.0000 mg | INTRAVENOUS | Status: DC | PRN

## 2019-06-16 MED ORDER — LACTATED RINGERS IV SOLN
500.0000 mL | INTRAVENOUS | Status: DC | PRN
  Administered 2019-06-16: 1000 mL via INTRAVENOUS
  Administered 2019-06-17: 250 mL via INTRAVENOUS

## 2019-06-16 MED ORDER — PHENYLEPHRINE 40 MCG/ML (10ML) SYRINGE FOR IV PUSH (FOR BLOOD PRESSURE SUPPORT): 80.0000 ug | PREFILLED_SYRINGE | INTRAVENOUS | Status: DC | PRN

## 2019-06-16 MED ORDER — SOD CITRATE-CITRIC ACID 500-334 MG/5ML PO SOLN: 30.0000 mL | ORAL | Status: DC | PRN

## 2019-06-16 MED ORDER — OXYTOCIN 40 UNITS IN NORMAL SALINE INFUSION - SIMPLE MED: 2.5000 [IU]/h | INTRAVENOUS | Status: DC

## 2019-06-16 MED ORDER — OXYCODONE-ACETAMINOPHEN 5-325 MG PO TABS: 1.0000 | ORAL_TABLET | ORAL | Status: DC | PRN

## 2019-06-16 MED ORDER — PENICILLIN G 3 MILLION UNITS IVPB - SIMPLE MED
3.0000 10*6.[IU] | INTRAVENOUS | Status: DC
  Administered 2019-06-16 – 2019-06-17 (×4): 3 10*6.[IU] via INTRAVENOUS
  Filled 2019-06-16 (×4): qty 100

## 2019-06-16 MED ORDER — FENTANYL-BUPIVACAINE-NACL 0.5-0.125-0.9 MG/250ML-% EP SOLN
12.0000 mL/h | EPIDURAL | Status: DC | PRN
  Filled 2019-06-16: qty 250

## 2019-06-16 MED ORDER — LIDOCAINE HCL (PF) 1 % IJ SOLN: 30.0000 mL | INTRAMUSCULAR | Status: DC | PRN

## 2019-06-16 MED ORDER — ZOLPIDEM TARTRATE 5 MG PO TABS: 5.0000 mg | ORAL_TABLET | Freq: Every evening | ORAL | Status: DC | PRN

## 2019-06-16 NOTE — Progress Notes (Signed)
FACULTY PRACTICE ANTEPARTUM COMPREHENSIVE PROGRESS NOTE  Dawn Wu is a 33 y.o. G2P1001 at [redacted]w[redacted]d who is admitted for Oligohydramnios.  Estimated Date of Delivery: 07/19/19 Fetal presentation is cephalic.  Length of Stay:  2 Days. Admitted 06/14/2019  Subjective: Patient just came back from BPP, had 4/10 (had reactive NST). Dr. Noralee Space recommends induction of labor. Patient reports good fetal movement.  She reports rare uterine contractions, no bleeding and no loss of fluid per vagina.  Vitals:  Blood pressure 111/66, pulse 80, temperature 98.2 F (36.8 C), temperature source Oral, resp. rate 18, height 5' 1.5" (1.562 m), weight 84.4 kg, last menstrual period 10/12/2018, SpO2 98 %. Physical Examination: CONSTITUTIONAL: Well-developed, well-nourished female in no acute distress.  HENT:  Normocephalic, atraumatic, External right and left ear normal. Oropharynx is clear and moist EYES: Conjunctivae and EOM are normal. Pupils are equal, round, and reactive to light. No scleral icterus.  NECK: Normal range of motion, supple, no masses SKIN: Skin is warm and dry. No rash noted. Not diaphoretic. No erythema. No pallor. NEUROLGIC: Alert and oriented to person, place, and time. Normal reflexes, muscle tone coordination. No cranial nerve deficit noted. PSYCHIATRIC: Normal mood and affect. Normal behavior. Normal judgment and thought content. CARDIOVASCULAR: Normal heart rate noted, regular rhythm RESPIRATORY: Effort and breath sounds normal, no problems with respiration noted MUSCULOSKELETAL: Normal range of motion. No edema and no tenderness. 2+ distal pulses. ABDOMEN: Soft, nontender, nondistended, gravid. CERVIX: Dilation: 1 Effacement (%): Thick Cervical Position: Posterior Exam by:: N Nugent, NP (done on admission)  Fetal monitoring: FHR: 135 bpm, Variability: moderate, Accelerations: Present, Decelerations: Absent  Uterine activity: 1-2 contractions per hour  Results for orders  placed or performed during the hospital encounter of 06/14/19 (from the past 48 hour(s))  Fern Test     Status: Normal   Collection Time: 06/14/19  6:41 PM  Result Value Ref Range   POCT Fern Test Negative = intact amniotic membranes   Amnisure rupture of membrane (rom)not at [facility]     Status: None   Collection Time: 06/14/19  7:05 PM  Result Value Ref Range   Amnisure ROM NEGATIVE     Comment: Performed at [facility], [address], [place], [place] [address]  SARS Coronavirus 2 [facility] order, Performed in [facility] hospital lab) Nasopharyngeal Nasopharyngeal Swab     Status: None   Collection Time: 06/14/19  8:43 PM   Specimen: Nasopharyngeal Swab  Result Value Ref Range   SARS Coronavirus 2 NEGATIVE NEGATIVE    Comment: (NOTE) If result is NEGATIVE SARS-CoV-2 target nucleic acids are NOT DETECTED. The SARS-CoV-2 RNA is generally detectable in upper and lower  respiratory specimens during the acute phase of infection. The lowest  concentration of SARS-CoV-2 viral copies this assay can detect is 250  copies / mL. A negative result does not preclude SARS-CoV-2 infection  and should not be used as the sole basis for treatment or other  patient management decisions.  A negative result may occur with  improper specimen collection / handling, submission of specimen other  than nasopharyngeal swab, presence of viral mutation(s) within the  areas targeted by this assay, and inadequate number of viral copies  (<250 copies / mL). A negative result must be combined with clinical  observations, patient history, and epidemiological information. If result is POSITIVE SARS-CoV-2 target nucleic acids are DETECTED. The SARS-CoV-2 RNA is generally detectable in upper and lower  respiratory specimens dur ing the acute phase of infection.  Positive  results are indicative of active infection with SARS-CoV-2.  Clinical  correlation with patient history and other diagnostic information is   necessary to determine patient infection status.  Positive results do  not rule out bacterial infection or co-infection with other viruses. If result is PRESUMPTIVE POSTIVE SARS-CoV-2 nucleic acids MAY BE PRESENT.   A presumptive positive result was obtained on the submitted specimen  and confirmed on repeat testing.  While 2019 novel coronavirus  (SARS-CoV-2) nucleic acids may be present in the submitted sample  additional confirmatory testing may be necessary for epidemiological  and / or clinical management purposes  to differentiate between  SARS-CoV-2 and other Sarbecovirus currently known to infect humans.  If clinically indicated additional testing with an alternate test  methodology [phone]) is advised. The SARS-CoV-2 RNA is generally  detectable in upper and lower respiratory sp ecimens during the acute  phase of infection. The expected result is Negative. Fact Sheet for Patients:  StrictlyIdeas.no Fact Sheet for Healthcare Providers: BankingDealers.co.za This test is not yet approved or cleared by the [place] FDA and has been authorized for detection and/or diagnosis of SARS-CoV-2 by FDA under an Emergency Use Authorization (EUA).  This EUA will remain in effect (meaning this test can be used) for the duration of the COVID-19 declaration under Section 564(b)(1) of the Act, 21 U.S.C. section 360bbb-3(b)(1), unless the authorization is terminated or revoked sooner. Performed at [facility], [place] [address] [place], [place] [address]   Type and screen [place]     Status: None   Collection Time: 06/14/19  9:15 PM  Result Value Ref Range   ABO/RH(D) O POS    Antibody Screen NEG    Sample Expiration      06/17/2019,2359 Performed at [facility], [place] [address] [place], [place] [address]   CBC     Status: Abnormal   Collection Time: 06/14/19  9:15 PM  Result Value Ref Range   WBC 9.7  4.0 - 10.5 K/uL   RBC 4.73 3.87 - 5.11 MIL/uL   Hemoglobin 12.4 12.0 - 15.0 g/dL   HCT 38.1 36.0 - 46.0 %   MCV 80.5 80.0 - 100.0 fL   MCH 26.2 26.0 - 34.0 pg   MCHC 32.5 30.0 - 36.0 g/dL   RDW 14.4 11.5 - 15.5 %   Platelets 142 (L) 150 - 400 K/uL    Comment: REPEATED TO VERIFY   nRBC 0.0 0.0 - 0.2 %    Comment: Performed at [facility], [place] [address] [place], [place] [address]  ABO/Rh     Status: None   Collection Time: 06/14/19  9:15 PM  Result Value Ref Range   ABO/RH(D) O POS    No rh immune globuloin      NOT A RH IMMUNE GLOBULIN CANDIDATE, PT RH POSITIVE Performed at [place] [address] [place], [place] [address]   Glucose, capillary     Status: Abnormal   Collection Time: 06/15/19  1:22 AM  Result Value Ref Range   Glucose-Capillary 157 (H) 70 - 99 mg/dL  Glucose, capillary     Status: Abnormal   Collection Time: 06/15/19  6:45 AM  Result Value Ref Range   Glucose-Capillary 119 (H) 70 - 99 mg/dL  Glucose, capillary     Status: Abnormal   Collection Time: 06/15/19 10:33 AM  Result Value Ref Range   Glucose-Capillary 137 (H) 70 - 99 mg/dL  Comment 1 Notify RN    Comment 2 Document in Chart   Glucose, capillary     Status: Abnormal   Collection Time: 06/15/19  3:36 PM  Result Value Ref Range   Glucose-Capillary 121 (H) 70 - 99 mg/dL  Glucose, capillary     Status: Abnormal   Collection Time: 06/15/19  9:45 PM  Result Value Ref Range   Glucose-Capillary 144 (H) 70 - 99 mg/dL  CBC     Status: Abnormal   Collection Time: 06/16/19  4:57 AM  Result Value Ref Range   WBC 11.3 (H) 4.0 - 10.5 K/uL   RBC 4.60 3.87 - 5.11 MIL/uL   Hemoglobin 12.2 12.0 - 15.0 g/dL   HCT 16.136.9 09.636.0 - 04.546.0 %   MCV 80.2 80.0 - 100.0 fL   MCH 26.5 26.0 - 34.0 pg   MCHC 33.1 30.0 - 36.0 g/dL   RDW 40.914.6 81.111.5 - 91.415.5 %   Platelets 129 (L) 150 - 400 K/uL   nRBC 0.0 0.0 - 0.2 %    Comment: Performed at [facility], [address] [place], [place] [address]   Glucose, capillary     Status: Abnormal   Collection Time: 06/16/19  6:47 AM  Result Value Ref Range   Glucose-Capillary 121 (H) 70 - 99 mg/dL    [place] Mfm Fetal Bpp Wo Non Stress  Result Date: 06/16/2019 ----------------------------------------------------------------------  OBSTETRICS REPORT                        (Signed Final 06/16/2019 10:05 am) ---------------------------------------------------------------------- Patient Info  ID #:       [id number]                          D.O.B.:  1986-08-26 (33 yrs)  Name:       Dawn AlmMILDRED Wu                 Visit Date: 06/16/2019 07:23 am ---------------------------------------------------------------------- Performed By  Performed By:     Magnus Ivaneresa Jones           Secondary Phy.:    [facility]                    RDMS, RVT  Attending:        Noralee Spaceavi Shankar MD        Address:           [phone] [place]                                                              [place], [place]  Referred By:      Jethro BastosUGONNA A               Location:          [facility]                    [place] LargeANYANWU MD                                [facility]  Ref. Address:     [address]  [address]                    [place], [place]                    [id number] ---------------------------------------------------------------------- Orders   #  Description                          Code         Ordered By   1  US MFM FETAL BPP WO NON              76819.01     [facility]      STRESS  ----------------------------------------------------------------------   #  Order #                    Accession #                 Episode #   1  [id number]                  [id number]                  [id number]  ---------------------------------------------------------------------- Indications   Gestational diabetes in pregnancy,             O24.415   controlled by oral hypoglycemic drugs   Fetal abnormality - other known or             O35.9XX0   suspected (thickened nuchal fold) (Neg AFP,   low risk  NIPs)   Obesity complicating pregnancy, third          O99.213   trimester   Oligohydramnios / Decreased amniotic fluid     O41.00X0   volume   Large for gestational age fetus affecting      O36.60X0   management of mother   Gestational Thrombocytopenia affecting         O99.119, D69.6   pregnancy, antepartum   [redacted] weeks gestation of pregnancy                Z3A.35  ---------------------------------------------------------------------- Vital Signs                                                 Height:        5'2" ---------------------------------------------------------------------- Fetal Evaluation  Num Of Fetuses:          1  Fetal Heart Rate(bpm):   130  Cardiac Activity:        Observed  Presentation:            Cephalic  Placenta:                Anterior  Amniotic Fluid  AFI FV:      Decreased  AFI Sum(cm)     %Tile       Largest Pocket(cm)  5.91            < 3         2.54  RUQ(cm)       RLQ(cm)       LUQ(cm)        LLQ(cm)  1.63          2.54          1.74  0 ---------------------------------------------------------------------- Biophysical Evaluation  Amniotic F.V:   Pocket => 2 cm             F. Tone:         Not Observed  F. Movement:    Not Observed               Score:           2/8  F. Breathing:   Not Observed ---------------------------------------------------------------------- OB History  Gravidity:    2         Term:   1  Living:       1 ---------------------------------------------------------------------- Gestational Age  LMP:           35w 2d        Date:  10/12/18                 EDD:   07/19/19  Best:          Consuello Closs 2d     Det. By:  LMP  (10/12/18)          EDD:   07/19/19 ---------------------------------------------------------------------- Impression  Patient was admitted yesterday with oligohydramnios.  PPROM was ruled out on clinical examination.  On today's ultrasound, amniotic fluid is decreased, but not  consistent with oligohydramnios. Fetal breathing movements,  tone and gross  body movements did not meet the criteria of  BPP. Cephalic presentation.  I reviewed the NST. BPP score including NST: 4/10.  Patient had leakage of amniotic fluid and I feel that PPROM  cannot be clearly ruled out. Options include: a) continuous  monitoring (NST) and repeat BPP in 4 hours, or b) deliver  now.  Discussed with Dr. Macon Large. ----------------------------------------------------------------------                  Noralee Space, MD Electronically Signed Final Report   06/16/2019 10:05 am ----------------------------------------------------------------------  [name] Mfm Fetal Bpp Wo Non Stress  Result Date: 06/14/2019 ----------------------------------------------------------------------  OBSTETRICS REPORT                       (Signed Final 06/14/2019 04:55 pm) ---------------------------------------------------------------------- Patient Info  ID #:       [id number]                          D.O.B.:  1986/06/18 (33 yrs)  Name:       Dawn Wu                 Visit Date: 06/14/2019 04:00 pm ---------------------------------------------------------------------- Performed By  Performed By:     Eden Lathe BS      Ref. Address:     [address]                    RDMS [address]                                                             Road  [place], [place]                                                             [address]  Attending:        Noralee Space MD        [facility]  Referred By:      Lawernce Pitts CNM ---------------------------------------------------------------------- Orders   #  Description                          Code         Ordered By   1  [name] MFM FETAL BPP WO NON              76819.01     [facility]      STRESS  ----------------------------------------------------------------------   #  Order #                     Accession #                 Episode #   1  [id number]                  [id number]                  [id number]  ---------------------------------------------------------------------- Indications   Encounter for other antenatal screening        Z36.2   follow-up   Gestational diabetes in pregnancy,             O24.415   controlled by oral hypoglycemic drugs   Fetal abnormality - other known or             O35.9XX0   suspected (thickened nuchal fold) (Neg AFP,   low risk NIPs)   Obesity complicating pregnancy, third          O99.213   trimester   Leakage of amniotic fluid                      O42.90   Oligohydramnios / Decreased amniotic fluid     O41.00X0   volume   [redacted] weeks gestation of pregnancy                Z3A.35  ---------------------------------------------------------------------- Vital Signs                                                 Height:        5'2" ---------------------------------------------------------------------- Fetal Evaluation  Num Of Fetuses:         1  Fetal  Heart Rate(bpm):  138  Cardiac Activity:       Observed  Presentation:           Cephalic  Amniotic Fluid  AFI FV:      Oligohydramnios  AFI Sum(cm)     %Tile       Largest Pocket(cm)  3.91            < 3         2.41  RUQ(cm)       RLQ(cm)       LUQ(cm)        LLQ(cm)  2.41          0             0              1.5 ---------------------------------------------------------------------- Biophysical Evaluation  Amniotic F.V:   Pocket => 2 cm two         F. Tone:        Observed                  planes  F. Movement:    Observed                   Score:          8/8  F. Breathing:   Observed ---------------------------------------------------------------------- OB History  Gravidity:    2         Term:   1  Living:       1 ---------------------------------------------------------------------- Gestational Age  LMP:           35w 0d        Date:  10/12/18                 EDD:   07/19/19  Best:          Consuello Closs 0d     Det. By:  LMP  (10/12/18)           EDD:   07/19/19 ---------------------------------------------------------------------- Impression  Gestational diabetes. Patient takes metformin for control. She  gives history of leakage of amniotic fluid for 2 days.  On ultrasound, oligohydramnios is seen. Antenatal testing is  reassuring. BPP 8/8.  I advised the patient to go to the MAU for evaluation.  Discussed with MAU team. ---------------------------------------------------------------------- Recommendations  -MAU evaluation.  -If PPROM is confirmed, option of delivery now versus at 37  weeks may be discussed. ----------------------------------------------------------------------                  Noralee Space, MD Electronically Signed Final Report   06/14/2019 04:55 pm ----------------------------------------------------------------------   Current scheduled medications . famotidine  20 mg Oral BID  . metFORMIN  1,000 mg Oral Q breakfast  . metFORMIN  500 mg Oral Q supper  . mometasone-formoterol  2 puff Inhalation BID  . prenatal multivitamin  1 tablet Oral Q1200    I have reviewed the patient's current medications.  ASSESSMENT: Principal Problem:   Oligohydramnios in third trimester with BPP 4/10 Active Problems:   Supervision of high-risk pregnancy   Obesity affecting pregnancy   Gestational diabetes mellitus (GDM) in third trimester   LGA (large for gestational age) fetus affecting management of mother   Gestational thrombocytopenia (HCC)   PLAN: Discussed need for delivery with patient. Will proceed with IOL, discussed high risk of needing cesarean delivery. All questions answered. L&D team, Neonatology team informed.  Category I FHR tracing,s/p betamethasone x 2. Blood sugars are  slightly elevated due to betamethasone, continue to monitor closely and continue Metformin for now Transfer to L&D soon for induction.   Jaynie CollinsUGONNA  Leanore Biggers, MD, FACOG Obstetrician & Gynecologist, [facility], [facility]

## 2019-06-16 NOTE — Progress Notes (Signed)
LABOR PROGRESS NOTE   Fluty is a 33 y.o. G2P1001 at [redacted]w[redacted]d  admitted for oligohydramnios. She is beta-complete, and had a BPP 4/10, with recommendation for IOL.   Subjective: Patient is comfortable and in no acute distress. She denies vaginal leaking or bleeding. She is feeling baby move.   Objective: Pleasant and cooperative.  Dilation: 1 Effacement (%): 40 Cervical Position: Posterior Station: -2 Presentation: Vertex Exam by: Sparcino FHT: baseline rate 145, moderate varibility, accels, no decels Toco: CTX q2-3 min   Assessment / Plan: Skilton is a 33 y.o. G2P1001 at [redacted]w[redacted]d  admitted for oligohydramnios. She is beta-complete, and had a BPP 4/10, with recommendation for IOL. All questions were answered at bedside.  She is beta-complete.   Labor: continue with cytotec q4h vaginal, consider foley bulb, continue time-appropriate cervical exams.   Fetal Wellbeing:  Fetal status reassuring. EFW 8# by Leopold's. Cephalic by Leopold's and SVE.  -- continuous fetal monitoring    Pain Control:  Desires epidural when contractions become painful   Anticipated MOD:  Anticipate cervical ripening, C/S as appropriate.   Mathis Dad, D.O. OB Fellow

## 2019-06-16 NOTE — Progress Notes (Signed)
Orlene Erm G2P1001 at [redacted]w[redacted]d. Cervical exam per patient request as she contemplates readiness for epidural. Unchanged from previous, continue Pitocin titration. Pt may have epidural on request.  Mallie Snooks, MSN, CNM Certified Nurse Midwife, Faculty Practice 06/16/19  10:14 PM

## 2019-06-16 NOTE — Consult Note (Signed)
Neonatology Consult to Antenatal Patient:  I was asked by Dr. Harolyn Rutherford to see this patient in order to provide antenatal counseling due to prematurity.  Dawn Wu was admitted 06/14/19 at 35.[redacted] weeks GA for IOL due to oligohydramnios and BPP 4/10 with question of PPROM. She is currently not having active labor. She has received BTMZ and is getting IV PCN.  Ffern and amniosure are negative. Pregnancy complicated by GDMA2 on Metformin with fairly good control, PCOS, and obesity. [name] is involved and supportive.    I spoke with the patient. We discussed likely delivery in the next 1-2 days, including usual DR management.  NICU presence dependent on OB assessment.  Due to baby's late preterm status, I discussed possible respiratory complications and need for support, IV access, feedings (mother desires breast feeding, which was encouraged), and LOS if requires NICU admission. There is also an elevated risk for hypoglycemia during the early newborn period that may require support. She did have questions at this time which Ianswered.   Thank you for asking me to see this patient.  Monia Sabal Katherina Mires, MD Neonatologist  The total length of face-to-face or floor/unit time for this encounter was 25 minutes. Counseling and/or coordination of care was 40 minutes of the above.

## 2019-06-16 NOTE — Progress Notes (Signed)
Dawn Wu is a 33 y.o. G2P1001 at [redacted]w[redacted]d   Subjective: Reporting contraction pain 6/10, 2-3/10 between contractions. Planning epidural but not ready for it yet. Has questions regarding NICU presence at delivery, concerns for newborn transition period.  Objective: BP 120/61   Pulse 71   Temp 98.6 F (37 C) (Oral)   Resp 18   Ht 5' 1.5" (1.562 m)   Wt 84.5 kg   LMP 10/12/2018   SpO2 98%   BMI 34.61 kg/m  I/O last 3 completed shifts: In: 1327.8 [I.V.:1227.8; IV Piggyback:100] Out: -  No intake/output data recorded.  FHT:  FHR: 125 bpm, variability: moderate,  accelerations:  Present,  decelerations:  Absent UC:   irregular, every 2-6 minutes SVE:   Dilation: 5.5 Effacement (%): 70 Station: -2 Exam by:: Krystal Eaton RN   Labs: Lab Results  Component Value Date   WBC 11.3 (H) 06/16/2019   HGB 12.2 06/16/2019   HCT 36.9 06/16/2019   MCV 80.2 06/16/2019   PLT 129 (L) 06/16/2019    Assessment / Plan: --33 y.o. G2P1001 at [redacted]w[redacted]d admitted for oligo, BPP 4/10 --Answered questions related to newborn immediate transition period, possible presence of NICU at delivery  Labor Category I tracing S/p Cytotec x 2 and cook catheter Pitocin infusing at 8 milliunits May have epidural on request Most recent check at 1845, plan to recheck in 4-6 hours or with change in pain score, new onset perineal pressure or as indicated by change in fetal status   A2GDM (Metformin) Elevated CBG at 7pm following snack of graham crackers Normal on recheck at [place], CNM 06/16/2019, 9:19 PM

## 2019-06-16 NOTE — Progress Notes (Addendum)
LABOR PROGRESS NOTE   Mirandais a 33 y.o.G2P1001 at [redacted]w[redacted]d admitted for oligohydramnios. She is beta-complete, and had a BPP 4/10, with recommendation for IOL.   Subjective: Patient is comfortable, feeling baby move. Denies vaginal bleeding or leakage of fluid. Status post 2 doses vaginal cytotec   Objective: Dilation: 1-2 Effacement (%): 40 Cervical Position: Posterior Station: -2 Presentation: Vertex Exam by: Sparcino FHT: baseline rate 125, moderate varibility, accels, no decels, occasional variable Toco: CTX q3-5 min   Assessment / Plan: Mirandais a 33 y.o.G2P1001 at [redacted]w[redacted]d admitted for oligohydramnios. She is beta-complete, and had a BPP 4/10, with recommendation for IOL.   Labor:  - continue with cytotec q4h (s/p 2 doses) - Cook's Catheter place 1600h (60U,40V) - Continue time-appropriate cervical exams.   Fetal Wellbeing:   Fetal status reassuring. EFW 8# by Leopold's. Cephalic by Leopold's and SVE.  -- continuous fetal monitoring    Pain Control:   Desires epidural when contractions become painful   Anticipated MOD:   Anticipate cervical ripening, C/S as appropriate.   Dawn Wu, D.O. OB Fellow

## 2019-06-16 NOTE — Progress Notes (Signed)
[name] admitted foroligohydramnios. She is beta-complete, and had a BPP 4/10, with recommendation for IOL.  Subjective: Patient is feeling more uncomfortable, contraction intensity is increasing, still feeling baby move. Denies leakage of fluid. Status post 2 doses vaginal cytotec and placement of Cook's catheter that just came out.  Objective: Dilation: 3 Effacement (%): 70 Cervical Position: Posterior Station: -2 Presentation: Vertex Exam by: Dr. Darene Lamer FHT: baseline rate125, moderate varibility,accels,nodecels Toco:CTX q3-5 min  Assessment / Plan: [name] admitted foroligohydramnios. She is beta-complete, and had a BPP 4/10, with recommendation for IOL.  Labor: - s/p 2 doses of vaginal cytotec - s/p Cook's Catheter placed 1600h (60U,40V), out 1820 - start Pitocin - Continue time-appropriate cervical exams.  Fetal Wellbeing: Fetal status reassuring. EFW8#by Leopold's. Cephalic by Leopold's and SVE.  -- continuous fetal monitoring  Pain Control: Desires epidural when contractions become painful  Anticipated MOD: Anticipate cervical ripening, C/S as appropriate.  Dawn Wu, D.O. OB Fellow [place] for [facility]

## 2019-06-17 ENCOUNTER — Encounter (HOSPITAL_COMMUNITY): Payer: Self-pay

## 2019-06-17 ENCOUNTER — Inpatient Hospital Stay (HOSPITAL_COMMUNITY): Payer: 59 | Admitting: Anesthesiology

## 2019-06-17 DIAGNOSIS — Z3A35 35 weeks gestation of pregnancy: Secondary | ICD-10-CM

## 2019-06-17 DIAGNOSIS — O283 Abnormal ultrasonic finding on antenatal screening of mother: Secondary | ICD-10-CM

## 2019-06-17 DIAGNOSIS — O24425 Gestational diabetes mellitus in childbirth, controlled by oral hypoglycemic drugs: Secondary | ICD-10-CM

## 2019-06-17 DIAGNOSIS — O4103X0 Oligohydramnios, third trimester, not applicable or unspecified: Secondary | ICD-10-CM

## 2019-06-17 LAB — RPR: RPR Ser Ql: NONREACTIVE

## 2019-06-17 LAB — GLUCOSE, CAPILLARY
Glucose-Capillary: 101 mg/dL — ABNORMAL HIGH (ref 70–99)
Glucose-Capillary: 96 mg/dL (ref 70–99)

## 2019-06-17 MED ORDER — BENZOCAINE-MENTHOL 20-0.5 % EX AERO
1.0000 "application " | INHALATION_SPRAY | CUTANEOUS | Status: DC | PRN
  Administered 2019-06-17: 1 via TOPICAL
  Filled 2019-06-17: qty 56

## 2019-06-17 MED ORDER — SENNOSIDES-DOCUSATE SODIUM 8.6-50 MG PO TABS
2.0000 | ORAL_TABLET | ORAL | Status: DC
  Administered 2019-06-18 – 2019-06-19 (×2): 2 via ORAL
  Filled 2019-06-17 (×2): qty 2

## 2019-06-17 MED ORDER — TETANUS-DIPHTH-ACELL PERTUSSIS 5-2.5-18.5 LF-MCG/0.5 IM SUSP: 0.5000 mL | Freq: Once | INTRAMUSCULAR | Status: DC

## 2019-06-17 MED ORDER — ONDANSETRON HCL 4 MG PO TABS: 4.0000 mg | ORAL_TABLET | ORAL | Status: DC | PRN

## 2019-06-17 MED ORDER — ACETAMINOPHEN 325 MG PO TABS
650.0000 mg | ORAL_TABLET | ORAL | Status: DC | PRN
  Administered 2019-06-17: 650 mg via ORAL
  Filled 2019-06-17 (×2): qty 2

## 2019-06-17 MED ORDER — SODIUM CHLORIDE (PF) 0.9 % IJ SOLN
INTRAMUSCULAR | Status: DC | PRN
  Administered 2019-06-17: 12 mL/h via EPIDURAL

## 2019-06-17 MED ORDER — DIPHENHYDRAMINE HCL 25 MG PO CAPS: 25.0000 mg | ORAL_CAPSULE | Freq: Four times a day (QID) | ORAL | Status: DC | PRN

## 2019-06-17 MED ORDER — COCONUT OIL OIL
1.0000 "application " | TOPICAL_OIL | Status: DC | PRN
  Administered 2019-06-19: 1 via TOPICAL

## 2019-06-17 MED ORDER — ONDANSETRON HCL 4 MG/2ML IJ SOLN: 4.0000 mg | INTRAMUSCULAR | Status: DC | PRN

## 2019-06-17 MED ORDER — LIDOCAINE HCL (PF) 1 % IJ SOLN
INTRAMUSCULAR | Status: DC | PRN
  Administered 2019-06-17: 11 mL via EPIDURAL

## 2019-06-17 MED ORDER — DIBUCAINE (PERIANAL) 1 % EX OINT: 1.0000 "application " | TOPICAL_OINTMENT | CUTANEOUS | Status: DC | PRN

## 2019-06-17 MED ORDER — PRENATAL MULTIVITAMIN CH
1.0000 | ORAL_TABLET | Freq: Every day | ORAL | Status: DC
  Administered 2019-06-17 – 2019-06-19 (×3): 1 via ORAL
  Filled 2019-06-17 (×3): qty 1

## 2019-06-17 MED ORDER — IBUPROFEN 600 MG PO TABS
600.0000 mg | ORAL_TABLET | Freq: Four times a day (QID) | ORAL | Status: DC
  Administered 2019-06-17 – 2019-06-19 (×9): 600 mg via ORAL
  Filled 2019-06-17 (×10): qty 1

## 2019-06-17 MED ORDER — WITCH HAZEL-GLYCERIN EX PADS: 1.0000 "application " | MEDICATED_PAD | CUTANEOUS | Status: DC | PRN

## 2019-06-17 MED ORDER — ZOLPIDEM TARTRATE 5 MG PO TABS: 5.0000 mg | ORAL_TABLET | Freq: Every evening | ORAL | Status: DC | PRN

## 2019-06-17 MED ORDER — MEASLES, MUMPS & RUBELLA VAC IJ SOLR: 0.5000 mL | Freq: Once | INTRAMUSCULAR | Status: DC

## 2019-06-17 MED ORDER — SIMETHICONE 80 MG PO CHEW: 80.0000 mg | CHEWABLE_TABLET | ORAL | Status: DC | PRN

## 2019-06-17 NOTE — Anesthesia Procedure Notes (Signed)
Epidural Patient location during procedure: OB Start time: 06/17/2019 12:16 AM End time: 06/17/2019 12:35 AM  Staffing Anesthesiologist: Lynda Rainwater, MD Performed: anesthesiologist   Preanesthetic Checklist Completed: patient identified, site marked, surgical consent, pre-op evaluation, timeout performed, IV checked, risks and benefits discussed and monitors and equipment checked  Epidural Patient position: sitting Prep: ChloraPrep Patient monitoring: heart rate, cardiac monitor, continuous pulse ox and blood pressure Approach: midline Location: L2-L3 Injection technique: LOR saline  Needle:  Needle type: Tuohy  Needle gauge: 17 G Needle length: 9 cm Needle insertion depth: 6 cm Catheter type: closed end flexible Catheter size: 20 Guage Catheter at skin depth: 10 cm Test dose: negative  Assessment Events: blood not aspirated, injection not painful, no injection resistance, negative IV test and no paresthesia  Additional Notes Reason for block:procedure for pain

## 2019-06-17 NOTE — Anesthesia Preprocedure Evaluation (Signed)
Anesthesia Evaluation  Patient identified by MRN, date of birth, ID band Patient awake    Reviewed: Allergy & Precautions, NPO status , Patient's Chart, lab work & pertinent test results  Airway Mallampati: II  TM Distance: >3 FB Neck ROM: Full    Dental  (+) Dental Advisory Given   Pulmonary neg pulmonary ROS,    breath sounds clear to auscultation       Cardiovascular negative cardio ROS   Rhythm:Regular Rate:Normal     Neuro/Psych negative neurological ROS     GI/Hepatic Neg liver ROS, GERD  ,  Endo/Other  diabetes  Renal/GU negative Renal ROS     Musculoskeletal   Abdominal   Peds  Hematology negative hematology ROS (+)   Anesthesia Other Findings   Reproductive/Obstetrics (+) Pregnancy                             Lab Results  Component Value Date   WBC 11.3 (H) 06/16/2019   HGB 12.2 06/16/2019   HCT 36.9 06/16/2019   MCV 80.2 06/16/2019   PLT 129 (L) 06/16/2019   Lab Results  Component Value Date   CREATININE 0.77 06/16/2019   BUN 13 06/16/2019   NA 137 06/16/2019   K 3.9 06/16/2019   CL 109 06/16/2019   CO2 17 (L) 06/16/2019    Anesthesia Physical  Anesthesia Plan  ASA: II  Anesthesia Plan: Epidural   Post-op Pain Management:    Induction: Intravenous  PONV Risk Score and Plan: 4 or greater and Ondansetron, Dexamethasone, Midazolam, Scopolamine patch - Pre-op and Treatment may vary due to age or medical condition  Airway Management Planned:   Additional Equipment:   Intra-op Plan:   Post-operative Plan:   Informed Consent: I have reviewed the patients History and Physical, chart, labs and discussed the procedure including the risks, benefits and alternatives for the proposed anesthesia with the patient or authorized representative who has indicated his/her understanding and acceptance.     Dental advisory given  Plan Discussed with:  CRNA  Anesthesia Plan Comments:         Anesthesia Quick Evaluation

## 2019-06-17 NOTE — Lactation Note (Signed)
This note was copied from a baby's chart. Lactation Consultation Note  Patient Name: Dawn Wu ITGPQ'D Date: 06/17/2019 Reason for consult: Follow-up assessment;Late-preterm 34-36.6wks P2, 16 hour female infant. Infant had 4 voids and one stool. 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 Mom feels breastfeeding is going well she has been following LPTI guidelines breastfeeding for 20 minutes and then supplementing with her EBM at this time. Per mom, she been using medela DEBP and first time she pumped 15 ml, 2nd time 6 ml and 3rd time was 5 mls. Mom has been giving infant EBM supplement after she latches infant to breast. Mom knows to pump every 3 hours for 15 minutes on initial setting. LC did not observe a latch at this time infant was breastfeed at 2030 pm for 20 minutes and then given 5 ml of EBM using very slow flow (purple) bottle nipple. Mom knows to breastfeed infant by LPTI policy, do STS as much as possible and not exceed 30 minutes per feeding. Mom knows to call Nurse or [place] if she has any questions, concerns or need assistance with latching infant to breast.   Maternal Data    Feeding Feeding Type: Breast Fed  LATCH Score                   Interventions    Lactation Tools Discussed/Used     Consult Status Consult Status: Follow-up Date: 06/18/19 Follow-up type: In-patient    Vicente Serene 06/17/2019, 9:47 PM

## 2019-06-17 NOTE — Lactation Note (Addendum)
This note was copied from a baby's chart. Lactation Consultation Note  Patient Name: Boy Lella Mullany VOHYW'[id number] Date: 06/17/2019   Initial visit at 8 hours of life with this 3 week infant. Infant has already been supplemented with EBM 3 times since birth. Mom reports having had an abundant supply with her 1st child (who is now almost 31yo). Mom reports she has been leaking since [redacted] weeks gestation.  Mom understands that most LPIs do not transfer milk well at the breast & require supplementation close to their due date. So far, supplementation has occurred by finger-feeding with a curved-tip syringe. I recommended that Mom begin using a bottle once supplementing amounts are around 10 mL. I took in the Similac slow-flow nipple (yellow).  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 Mom says she knows how to do hand expression. Mom pumped & BO with her 1st child for 6 months (who was born at term). Mom is comfortable with pumping.    Matthias Hughs [facility] 06/17/2019, 1:24 PM

## 2019-06-17 NOTE — Lactation Note (Signed)
This note was copied from a baby's chart. Lactation Consultation Note  Patient Name: Dawn Wu [id number] Date: 06/17/2019  Initial visit attempted at 6 hours of life, but infant phlebotomy & RN are in room. This LC to return soon.  Matthias Hughs [facility] 06/17/2019, 12:09 PM

## 2019-06-17 NOTE — Anesthesia Postprocedure Evaluation (Signed)
Anesthesia Post Note  Patient: Dawn Wu  Procedure(s) Performed: AN AD [place]     Patient location during evaluation: Mother Baby Anesthesia Type: Epidural Level of consciousness: awake Pain management: satisfactory to patient Vital Signs Assessment: post-procedure vital signs reviewed and stable Respiratory status: spontaneous breathing Cardiovascular status: stable Anesthetic complications: no    Last Vitals:  Vitals:   06/17/19 0851 06/17/19 1217  BP: 115/65 113/74  Pulse: (!) 59 73  Resp: 18 20  Temp: 36.5 C 36.4 C  SpO2: 100% 100%    Last Pain:  Vitals:   06/17/19 1345  TempSrc:   PainSc: 5    Pain Goal: Patients Stated Pain Goal: 3 (06/14/19 2208)                 Casimer Lanius

## 2019-06-17 NOTE — Progress Notes (Signed)
Dawn Wu is a 33 y.o. G2P1001 at [redacted]w[redacted]d   Subjective: S/p epidural, denies pain, questions, concerns.  Objective: BP 113/77   Pulse 66   Temp 97.7 F (36.5 C) (Oral)   Resp 16   Ht 5' 1.5" (1.562 m)   Wt 84.5 kg   LMP 10/12/2018   SpO2 98%   BMI 34.61 kg/m  I/O last 3 completed shifts: In: 1327.8 [I.V.:1227.8; IV Piggyback:100] Out: -  No intake/output data recorded.  FHT:  FHR: 120 bpm, variability: moderate,  accelerations:  Present,  decelerations:  Absent UC:   irregular, every 2-4 minutes SVE:   Dilation: 5.5 Effacement (%): 80 Station: -2 Exam by:: Cassie Freer, RN  Labs: Lab Results  Component Value Date   WBC 11.3 (H) 06/16/2019   HGB 12.2 06/16/2019   HCT 36.9 06/16/2019   MCV 80.2 06/16/2019   PLT 129 (L) 06/16/2019    Assessment / Plan: --33 y.o. G2P1001 at [redacted]w[redacted]d   Labor --Category I tracing --Per RN cervix still very posterior --Patient to nap, then q one hour position changes including peanut ball, throne  A2GDM --Plan to move to Q 2 hour CBGs with next exam --Stable and asymptomatic  Consider AROM when active, engaged and anterior.  Anticipate NSVD  Darlina Rumpf, CNM 06/17/2019, 1:18 AM

## 2019-06-17 NOTE — Progress Notes (Signed)
Dawn Wu is a 33 y.o. G2P1001 at [redacted]w[redacted]d   Subjective: Intermittent nausea. Comfortable with epidural, dozing.  Objective: BP 129/78   Pulse (!) 56   Temp 98.3 F (36.8 C) (Oral)   Resp 20   Ht 5' 1.5" (1.562 m)   Wt 84.5 kg   LMP 10/12/2018   SpO2 98%   BMI 34.61 kg/m  I/O last 3 completed shifts: In: 1327.8 [I.V.:1227.8; IV Piggyback:100] Out: -  No intake/output data recorded.  FHT:  FHR: 120 bpm, variability: moderate,  accelerations:  Present,  decelerations:  Absent UC:   irregular, every 2-4 minutes SVE:   Dilation: 7 Effacement (%): 100 Station: 0 Exam by:: Maryelizabeth Kaufmann, CNM  Labs: Lab Results  Component Value Date   WBC 11.3 (H) 06/16/2019   HGB 12.2 06/16/2019   HCT 36.9 06/16/2019   MCV 80.2 06/16/2019   PLT 129 (L) 06/16/2019    Assessment / Plan: --At bedside per RN request to assess variability. , Improving with IV fluid bolus --Positive scalp stim --Plan to AROM with next exam --Epidural in place and effective --CBGs well controlled, move to q 2 hour for active labor --Anticipate NSVD  Darlina Rumpf, CNM 06/17/2019, 3:17 AM

## 2019-06-18 NOTE — Progress Notes (Signed)
POSTPARTUM PROGRESS NOTE  Post Partum Day 1  Subjective:  Dawn Wu is a 33 y.o. [id number] s/p SVD at [redacted]w[redacted]d.  She reports she is doing well. No acute events overnight. She denies any problems with ambulating, voiding or po intake. Denies nausea or vomiting.  Denies lightheadedness or dizziness. Pain is moderately controlled.  Lochia is appropriate.  Objective: Blood pressure (!) 104/52, pulse 83, temperature 97.8 F (36.6 C), temperature source Oral, resp. rate 16, height 5' 1.5" (1.562 m), weight 84.5 kg, last menstrual period 10/12/2018, SpO2 98 %, unknown if currently breastfeeding.  Physical Exam:  General: alert, cooperative and no distress Chest: no respiratory distress. Clear to auscultation bilaterally without wheezes or crackles. Heart:regular rate, no murmurs, rubs, or gallops Abdomen: soft, nontender,  Uterine Fundus: firm, appropriately tender DVT Evaluation: No calf swelling or tenderness Extremities: No edema Skin: warm, dry  Recent Labs    06/16/19 0457  HGB 12.2  HCT 36.9    Assessment/Plan: Dawn Wu is a 33 y.o. [id number] s/p SVD at [redacted]w[redacted]d   PPD#1 - Doing well  Routine postpartum care Hypotension: BP 104/52 this AM. Asymptomatic.             Continue to monitor Contraception: does not desire Feeding: Breast Dispo: Plan for discharge 8/15.   LOS: 4 days   [place] Student 06/18/2019, 7:46 AM

## 2019-06-18 NOTE — Lactation Note (Signed)
This note was copied from a baby's chart. Lactation Consultation Note  Patient Name: Dawn Wu Today's Date: 06/18/2019 Reason for consult: Follow-up assessment;Hyperbilirubinemia;Infant < 6lbs;Late-preterm 34-36.6wks;Infant weight loss;Maternal endocrine disorder Type of Endocrine Disorder?: Diabetes(GDM)  39 hours old FT female who is being partially BF by his mother, she's a P2 and experienced BF. Mom is a [employer] employee and she was ready to get her pump. She picked the Medela Freestyle, mom is aware that if she needs to go up a size in her flanges (currently using flanges # 24) she'll have to purchase flanges # 27 for her Medela Freestyle in case pump parts are not compatible with hospital DEBP kit.   Baby is at 5% weight loss and he was put on double phototherapy at noon, bilirubin started to increase rapidly. Mom has been very diligent with her pumping, doing it every 3 hours, she's now getting about 5-8 ml of EBM and completing the amounts required for supplementation with Similac 22 calorie formula according to LPI guidelines. She's also limiting feeding at the breast to no more than 30 minutes at a time (doing 20 total at this time).  Offered assistance with latch but mom politely declined, she stated that baby just fed. Asked mom to call for assistance when needed. Reviewed LPI policy, feeding cues, cluster feeding, supplementation guidelines, pumping schedule, going back to work tips and newborn jaundice. Mom pumping when exiting the room, she had about 15 ml on the containers. Parents aware that baby's volume required for supplementation will go up once he turns 48 hours old.   Feeding plan  1. Encouraged mom to feed baby STS 8-12 times/24 hours or sooner if feeding cues are present 2. Mom will continue pumping every 3 hours after feedings and will offer her EBM first prior any formula 3. Parents will supplement with EBM and formula (if needed to complete volumes) every 3  hours after feedings  Parents reported all questions and concerns were answered, they're both aware of LC OP services and will call PRN.  Maternal Data    Feeding Feeding Type: Breast Fed   Interventions Interventions: Breast feeding basics reviewed;DEBP  Lactation Tools Discussed/Used Tools: Pump Breast pump type: Double-Electric Breast Pump   Consult Status Consult Status: Follow-up Date: 06/19/19 Follow-up type: In-patient    Dawn Wu 06/18/2019, 9:11 PM    

## 2019-06-19 MED ORDER — IBUPROFEN 600 MG PO TABS: 600.0000 mg | ORAL_TABLET | Freq: Four times a day (QID) | ORAL | 0 refills | Status: DC | PRN

## 2019-06-19 NOTE — Discharge Instructions (Signed)

## 2019-06-19 NOTE — Lactation Note (Signed)
This note was copied from a baby's chart. Lactation Consultation Note  Patient Name: Dawn Wu IOEVO'J Date: 06/19/2019 Reason for consult: Follow-up assessment;Late-preterm 34-36.6wks;Infant < 6lbs;Hyperbilirubinemia  P2 mother whose infant is now 37 hours old.  This is a LPTI at 35+3 weeks with a CGA of 35+5 weeks weighing <6 lbs.  Baby was sleeping under double phototherapy when I arrived.    Mother has been breast feeding and supplementing baby after every feeding.  She has not had to supplement with formula since yesterday since she is now obtaining appropriate volumes of EBM.  Reviewed the supplementation guidelines with mother.  She currently has 30 mls of EBM that she has finished pumping and will use at the next feeding.  Praised her for her continued effort in pumping.    Reviewed the LPTI and to awaken every three hours to feed if baby does not self awaken.  Mother will feed no longer than 30 minutes total and will supplement with at least 20-30 mls after every feeding.  She will feed baby more as desired.  Mother did state that she feels some pain with pumping.  I would like to evaluate this so asked mother to call me back when she is pumping.  Educated her on correct flange size and suction strength.  While visiting with mother, RN came into room to discontinue the phototherapy.  We will await further orders from the pediatrician about another bilirubin check.  Mother pleased.  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  She will call for any further questions/concerns.     Maternal Data Formula Feeding for Exclusion: No Has patient been taught Hand Expression?: Yes  Feeding Feeding Type: Bottle Fed - Formula Nipple Type: Slow - flow  LATCH Score                   Interventions    Lactation Tools Discussed/Used Pump Review: Setup, frequency, and cleaning(Reviewed; mother will call me back during pumping; states she has pain) Initiated by:: Paul Dykes Date initiated:: 06/19/19   Consult Status Consult Status: Follow-up Date: 06/20/19 Follow-up type: In-patient    Kane Kusek R Darletta Noblett 06/19/2019, 10:32 AM

## 2019-06-19 NOTE — Discharge Summary (Signed)
Postpartum Discharge Summary     Patient Name: Dawn Wu DOB: 05/23/1986 MRN: [id number]  Date of admission: 06/14/2019 Delivering Provider: Darlina Rumpf   Date of discharge: 06/19/2019  Admitting diagnosis: low fluids Intrauterine pregnancy: [redacted]w[redacted]d    Secondary diagnosis:  Principal Problem:   Oligohydramnios in third trimester Active Problems:   Supervision of high-risk pregnancy   Obesity affecting pregnancy   Abnormal fetal BPP of 4/10   Gestational diabetes mellitus (GDM) in third trimester   LGA (large for gestational age) fetus affecting management of mother   Gestational thrombocytopenia ([place]  Additional problems: none     Discharge diagnosis: Preterm Pregnancy Delivered and GDM A2                                                                                              Post partum procedures:none  Augmentation: AROM, Pitocin, Cytotec and Foley Balloon  Complications: None  Hospital course:  Induction of Labor With Vaginal Delivery   33y.o. yo [id number] [id number] admitted to the hospital OB specialty care unit on 06/14/2019 for further evaluation of oligo as dx by MFM. She was given BMZ x 2 and tested neg for ROM. BPP was 4/10 on 06/16/19 and the decision was made to induce. Indication for induction: oligohydramnios.  Patient had an uncomplicated labor course, delivering the following morning after the induction methods listed above were used. Membrane Rupture Time/Date: 4:09 AM ,06/17/2019   Intrapartum Procedures: Episiotomy: None [1]                                         Lacerations:  1st degree [2];Perineal [11]  Patient had delivery of a Viable infant.  Information for the patient's newborn:  MiBriunna, Leicht0[[id number]]Delivery Method: Vag-Spont    06/17/2019  Details of delivery can be found in separate delivery note.  Patient had a routine postpartum course. Patient is discharged home 06/19/19.  Magnesium Sulfate recieved: No BMZ  received: Yes  Physical exam  Vitals:   06/18/19 0544 06/18/19 1437 06/18/19 2143 06/19/19 0615  BP: (!) 104/52 114/86 117/71 115/65  Pulse: 83 79 84 75  Resp: 16 16 16 17   Temp: 97.8 F (36.6 C) 98.1 F (36.7 C) 98 F (36.7 C) 97.7 F (36.5 C)  TempSrc: Oral Oral Oral Oral  SpO2: 98%  100%   Weight:      Height:       General: alert and cooperative Lochia: appropriate Uterine Fundus: firm Incision: N/A DVT Evaluation: No evidence of DVT seen on physical exam. Labs: Lab Results  Component Value Date   WBC 11.3 (H) 06/16/2019   HGB 12.2 06/16/2019   HCT 36.9 06/16/2019   MCV 80.2 06/16/2019   PLT 129 (L) 06/16/2019   CMP Latest Ref Rng & Units 06/16/2019  Glucose 70 - 99 mg/dL 98  BUN 6 - 20 mg/dL 13  Creatinine 0.44 - 1.00 mg/dL 0.77  Sodium 135 - 145 mmol/L 137  Potassium 3.5 -  5.1 mmol/L 3.9  Chloride 98 - 111 mmol/L 109  CO2 22 - 32 mmol/L 17(L)  Calcium 8.9 - 10.3 mg/dL 8.9  Total Protein 6.5 - 8.1 g/dL 6.4(L)  Total Bilirubin 0.3 - 1.2 mg/dL 0.4  Alkaline Phos 38 - 126 U/L 152(H)  AST 15 - 41 U/L 26  ALT 0 - 44 U/L 27    Discharge instruction: per After Visit Summary and "Baby and Me Booklet".  After visit meds:  Allergies as of 06/19/2019      Reactions   Shellfish Allergy Anaphylaxis   Nuvaring [etonogestrel-ethinyl Estradiol] Hives      Medication List    STOP taking these medications   Accu-Chek FastClix Lancets Misc   Accu-Chek Guide w/Device Kit   acetaminophen 500 MG tablet Commonly known as: TYLENOL   glucose blood test strip   metFORMIN 500 MG tablet Commonly known as: Glucophage   omeprazole 20 MG capsule Commonly known as: PriLOSEC     TAKE these medications   albuterol 108 (90 Base) MCG/ACT inhaler Commonly known as: VENTOLIN HFA Inhale 1-2 puffs into the lungs every 6 (six) hours as needed for wheezing or shortness of breath.   ibuprofen 600 MG tablet Commonly known as: ADVIL Take 1 tablet (600 mg total) by mouth every  6 (six) hours as needed.   PRENATAL VITAMINS PO Take by mouth.       Diet: routine diet  Activity: Advance as tolerated. Pelvic rest for 6 weeks.   Outpatient follow up:6 weeks Follow up Appt:No future appointments. Follow up Visit: [place] [facility] [place] [facility] [place]. Schedule an appointment as soon as possible for a visit in 6 week(s).   Specialty: Obstetrics and Gynecology Why: for your postpartum appointment, as well as a test for diabetes Contact information: [place], [place] [phone]          Please schedule this patient for Postpartum visit in: 4 weeks with the following provider: Any provider For C/S patients schedule nurse incision check in weeks 2 weeks: no High risk pregnancy complicated by: GDM Delivery mode: SVD Anticipated Birth Control: does not desire contraception PP Procedures needed: 2 hour GTT  Schedule Integrated BH visit: no   Newborn Data: Live born female  Birth Weight: 5 lb 8.7 oz (2515 g) APGAR: 8, 9  Newborn Delivery   Birth date/time: 06/17/2019 05:16:00 Delivery type: Vaginal, Spontaneous      Baby Feeding: Breast Disposition:TBD by peds; infant currently under phototherapy   06/19/2019 Myrtis Ser, CNM  8:56 AM

## 2019-06-20 ENCOUNTER — Ambulatory Visit: Payer: Self-pay

## 2019-06-20 NOTE — Lactation Note (Signed)
This note was copied from a baby's chart. Lactation Consultation Note  Patient Name: Dawn Wu GURKY'H Date: 06/20/2019 Reason for consult: Follow-up assessment;Late-preterm 34-36.6wks   Mom very full.  She is BF first then bottle feeding EBM to LPTI.   LC assisted mom with pumping using 27 size flange.  [facility] taught dad how to assist with breast massage during pumping.  A few minutes after mom began pumping, infant began cueing in bassinet.    LC undressed him and assisted latching infant to left side while mom pumped other side.  Infant latched well, rhythmic jaw movement noted and several spontaneous swallows heard.  LC reviewed BF basics of keeping newborn awake and actively sucking during feed.  Infant fed 15 minutes and fell asleep at breast with body relaxed.  Both breast felt softer than before and she stated she felt much better.    Mom collected approx. 3 ounces from pumping.    LC reviewed plan to bf infant then supplement afterwards, currently she is supplementing with 30 ml EBM.  Mom will continue to use DEBP  after BF to collect and feed back to infant.  Parents are using purple nipple but will attempt yellow nipple again  With paced bottle feeding.  LC reviewed paced feeding and LPTI information for family, limited feeds, supplementation, feeding with cues and at least every 3 hours.    [place] OP services provided and phone number shared along with SG info.    Maternal Data    Feeding Feeding Type: Breast Fed  LATCH Score Latch: Grasps breast easily, tongue down, lips flanged, rhythmical sucking.  Audible Swallowing: Spontaneous and intermittent  Type of Nipple: Everted at rest and after stimulation  Comfort (Breast/Nipple): Filling, red/small blisters or bruises, mild/mod discomfort  Hold (Positioning): Assistance needed to correctly position infant at breast and maintain latch.  LATCH Score: 8  Interventions Interventions: Breast feeding basics  reviewed;Assisted with latch;Skin to skin;Hand express;Expressed milk;Position options;Support pillows;Adjust position;DEBP;Breast compression  Lactation Tools Discussed/Used Tools: 66F feeding tube / Syringe Breast pump type: Double-Electric Breast Pump   Consult Status Consult Status: Complete Date: 06/20/19 Follow-up type: In-patient    Ferne Coe [facility] 06/20/2019, 10:20 AM

## 2019-06-21 ENCOUNTER — Other Ambulatory Visit: Payer: Self-pay | Admitting: *Deleted

## 2019-06-21 ENCOUNTER — Ambulatory Visit (HOSPITAL_COMMUNITY): Payer: 59

## 2019-06-21 NOTE — Patient Outreach (Signed)
[facility]) Care Management  06/21/2019  Dawn Wu October 27, 1986 [id number]  Dawn Wu is a member of the [place] [facility] Diabetes During Pregnancy Program  Transition of care telephone call  Referral received: 04/28/19 for enrollment into Diabetes During Pregnancy Program Insurance: [place]  Initial unsuccessful telephone call to patient's mobile number in order to complete transition of care assessment; no answer, left HIPAA compliant voicemail message requesting return call.   Objective: Per the electronic medical record, Dawn Wu  was hospitalized at [facility] and [facility] from 8/10-8/15 for labor. She delivered a baby boy weighing 5 lbs 8.7 ozs via vaginal delivery on 06/17/19. Her due date was 07/19/19. Comorbidities include: gestational diabetes, hepatic steatosis, polycystic ovarian syndrome, gestational thrombocytopenia, hyperlipidemia, obesity and abnormal PAP smear She was discharged to home on 06/19/19 with a breast pump per the discharge summary.   Plan: This RNCM will attempt another outreach within 4 business days.  Barrington Ellison RN,CCM,CDE [place] Management Coordinator Office Phone [phone] Office Fax [phone]

## 2019-06-22 ENCOUNTER — Encounter: Payer: 59 | Admitting: Advanced Practice Midwife

## 2019-06-24 ENCOUNTER — Other Ambulatory Visit: Payer: Self-pay | Admitting: *Deleted

## 2019-06-24 ENCOUNTER — Encounter: Payer: Self-pay | Admitting: *Deleted

## 2019-06-24 NOTE — Patient Outreach (Signed)
[facility]) Care Management  06/24/2019  Dawn Wu 1986-05-09 [id number]   Dawn Wu is a member of the [place] [facility] Diabetes During Pregnancy Program  Transition of care telephone call  Referral received: 04/28/19 for enrollment into Diabetes During Pregnancy Program Insurance: [facility] Health Choice Plan  Subjective: Successful telephone call to patient's preferred number in order to complete transition of care assessment; 2 HIPAA identifiers verified. Explained purpose of call and completed transition of care assessment.  Dawn Wu says she is trying to adjust to having a newborn and a toddler but otherwise things are going well. She says the baby, Dawn Wu, is breastfeeding well. She says she was told she did not have to check her blood sugar any more and that she will have an oral glucose tolerance test at 6 weeks postpartum. She says she has not made her post partum appointment yet because she is trying to line up childcare for her toddler and the baby if she cannot take the baby with her to her appointment.  She voiced appreciation to this RNCM for assisting her with gestational diabetes management.   Objective: Dawn Wu  was hospitalized at [facility] and [facility] from 8/10-8/15 for labor. She delivered a baby boy weighing 5 lbs 8.7 ozs via vaginal delivery on 06/17/19. Her due date was 07/19/19. Comorbidities include: gestational diabetes, hepatic steatosis, polycystic ovarian syndrome, gestational thrombocytopenia, hyperlipidemia, obesity and abnormal PAP smear She was discharged to home on 06/19/19 with a breast pump per the discharge summary.   Assessment:  Patient voices good understanding of all discharge instructions.  See transition of care flowsheet for assessment details.  Plan: Reviewed hospital discharge diagnosis of vaginal childbirth using hospital discharge instructions, assessing medication adherence,  reviewing problems requiring provider notification, and discussing the importance of follow up with obstetrician.  Emailed information on [employer] to [employer] since they offer childcare in the home to [facility] members at a reduced rate.  Will disenroll Dawn Wu from the [place] [facility] Diabetes During Pregnancy Program since her gestational diabetes resolved with childbirth. Encouraged her to have healthy awareness that she will be ar risk to develop Type 2 DM and to always inquire about her glucose value anytime it is tested.    Barrington Ellison RN,CCM,CDE [place] Management Coordinator Office Phone [phone] Office Fax [phone]

## 2019-07-19 ENCOUNTER — Inpatient Hospital Stay (HOSPITAL_COMMUNITY): Admission: AD | Admit: 2019-07-19 | Payer: 59 | Source: Home / Self Care

## 2019-07-23 ENCOUNTER — Other Ambulatory Visit: Payer: Self-pay

## 2019-07-23 ENCOUNTER — Encounter: Payer: Self-pay | Admitting: Certified Nurse Midwife

## 2019-07-23 ENCOUNTER — Ambulatory Visit (INDEPENDENT_AMBULATORY_CARE_PROVIDER_SITE_OTHER): Payer: 59 | Admitting: Certified Nurse Midwife

## 2019-07-23 VITALS — BP 104/72 | HR 73 | Wt 166.0 lb

## 2019-07-23 DIAGNOSIS — D696 Thrombocytopenia, unspecified: Secondary | ICD-10-CM

## 2019-07-23 DIAGNOSIS — O24419 Gestational diabetes mellitus in pregnancy, unspecified control: Secondary | ICD-10-CM

## 2019-07-23 DIAGNOSIS — O99119 Other diseases of the blood and blood-forming organs and certain disorders involving the immune mechanism complicating pregnancy, unspecified trimester: Secondary | ICD-10-CM | POA: Diagnosis not present

## 2019-07-23 DIAGNOSIS — R87610 Atypical squamous cells of undetermined significance on cytologic smear of cervix (ASC-US): Secondary | ICD-10-CM

## 2019-07-23 LAB — CBC
HCT: 42.4 % (ref 35.0–45.0)
Hemoglobin: 13.9 g/dL (ref 11.7–15.5)
MCH: 26.2 pg — ABNORMAL LOW (ref 27.0–33.0)
MCHC: 32.8 g/dL (ref 32.0–36.0)
MCV: 80 fL (ref 80.0–100.0)
MPV: 11.5 fL (ref 7.5–12.5)
Platelets: 231 Thousand/uL (ref 140–400)
RBC: 5.3 Million/uL — ABNORMAL HIGH (ref 3.80–5.10)
RDW: 14.1 % (ref 11.0–15.0)
WBC: 7.2 Thousand/uL (ref 3.8–10.8)

## 2019-07-23 MED ORDER — NORETHINDRONE 0.35 MG PO TABS: 1.0000 | ORAL_TABLET | Freq: Every day | ORAL | 11 refills | Status: DC

## 2019-07-23 NOTE — Progress Notes (Signed)
Post Partum Exam  Dawn Wu is a 33 y.o. G56P1102 female who presents for a postpartum visit. She is 4 weeks postpartum following a spontaneous vaginal delivery IOL @ [redacted]w[redacted]d for Oligihydramnios.  I have fully reviewed the prenatal and intrapartum course. The delivery was at [redacted]w[redacted]d gestational weeks.  Anesthesia: epidural. Postpartum course has been unremarkable. Baby's course has been unremarkable. Baby is feeding by breast. Bleeding no bleeding. Bowel function is normal. Bladder function is normal. Patient is not sexually active. Contraception method is oral progesterone-only contraceptive. Postpartum depression screening:neg  The following portions of the patient's history were reviewed and updated as appropriate: allergies, current medications, past family history, past medical history, past social history, past surgical history and problem list. Last pap smear done 2018 and was Normal  Review of Systems Pertinent items are noted in HPI.    Objective:  Blood pressure 104/72, pulse 73, weight 166 lb (75.3 kg), currently breastfeeding.  General:  alert, cooperative and no distress   Breasts:    Lungs: reg rate and effort  Heart:  reg rate  Abdomen:    Vulva:    Vagina:   Cervix:    Corpus:   Adnexa:    Rectal Exam:        Neuro: grossly nml  Assessment:   S/p SVD Normal postpartum exam.  A2GDM, delivered  Plan:   1.Contraception: oral progesterone-only contraceptive, discussed IUD and Nexplanon, provided pamphlets, pt prefers POP for now. Discussed back up method if takes late or forgets. Ok to resume IC in 1-2 weeks 2. GTT today 3. Follow up in: 1 year or as needed.

## 2019-07-24 ENCOUNTER — Telehealth: Payer: Self-pay | Admitting: Allergy & Immunology

## 2019-07-24 LAB — 2HR GTT W 1 HR, CARPENTER, 75 G
Glucose, 1 Hr, Gest: 150 mg/dL (ref 65–179)
Glucose, 2 Hr, Gest: 84 mg/dL (ref 65–152)
Glucose, Fasting, Gest: 82 mg/dL (ref 65–91)

## 2019-07-24 MED ORDER — PREDNISONE 10 MG PO TABS: ORAL_TABLET | ORAL | 0 refills | Status: DC

## 2019-07-24 NOTE — Telephone Encounter (Signed)
Patient called with hives over her entire body. She thinks that it might have been cross contamination from shrimp when she went out to eat. She is having no breathing problems at all and has no other systemic complaints. She did take one Allegra without improvement in her symptoms.   I sent in a small burst of prednisone. Confirmed pharmacy with the patient.  Salvatore Marvel, MD [facility] [place]

## 2019-07-26 NOTE — Telephone Encounter (Signed)
Spoke with patient and she has improved with Prednisone.

## 2019-07-30 ENCOUNTER — Other Ambulatory Visit: Payer: Self-pay

## 2019-07-30 MED ORDER — EPINEPHRINE 0.3 MG/0.3ML IJ SOAJ: 0.3000 mg | Freq: Once | INTRAMUSCULAR | 1 refills | Status: AC

## 2019-07-30 MED ORDER — BUDESONIDE-FORMOTEROL FUMARATE 160-4.5 MCG/ACT IN AERO: 2.0000 | INHALATION_SPRAY | Freq: Two times a day (BID) | RESPIRATORY_TRACT | 5 refills | Status: DC

## 2019-07-30 MED ORDER — ALBUTEROL SULFATE HFA 108 (90 BASE) MCG/ACT IN AERS: 1.0000 | INHALATION_SPRAY | Freq: Four times a day (QID) | RESPIRATORY_TRACT | 1 refills | Status: DC | PRN

## 2019-07-30 MED FILL — ALBUTEROL SULFATE HFA 108 (: 108 (90 BAS | 25 days supply | Qty: 18 | Fill #0

## 2019-07-30 MED FILL — EPINEPHRINE 0.3 MG AUTO-INJ: 0.3 | 30 days supply | Qty: 2 | Fill #0

## 2019-07-30 MED FILL — SYMBICORT 160-4.5 MCG INH: 160-4.5 | 30 days supply | Qty: 10 | Fill #0

## 2019-08-20 DIAGNOSIS — H52223 Regular astigmatism, bilateral: Secondary | ICD-10-CM | POA: Diagnosis not present

## 2019-08-26 MED FILL — EPINEPHRINE 0.3 MG AUTO-INJ: 0.3 | 30 days supply | Qty: 2 | Fill #1

## 2019-10-27 ENCOUNTER — Telehealth: Payer: Self-pay | Admitting: *Deleted

## 2019-10-27 MED ORDER — CEPHALEXIN 500 MG PO CAPS: 500.0000 mg | ORAL_CAPSULE | Freq: Four times a day (QID) | ORAL | 0 refills | Status: DC

## 2019-10-27 MED ORDER — DICLOXACILLIN SODIUM 250 MG PO CAPS: 250.0000 mg | ORAL_CAPSULE | Freq: Four times a day (QID) | ORAL | 0 refills | Status: DC

## 2019-10-27 NOTE — Telephone Encounter (Signed)
Pt called stating that she is still breast feeding and feels like she has mastitis in  Her left breast.  The breast is red, hot to touch and she has body aches.  RX sent to [facility] for Dicloxicillin.  Pt is aware that if she doesn't see any improvement by the first of the week she will need appt.

## 2019-10-27 NOTE — Telephone Encounter (Signed)
[facility] pharmacy called and said they did not have any Dicloxicillin and would like a substitute.  Per Dr Hulan Fray switch to Keflex 500 mg QID for 10 days.

## 2019-11-11 ENCOUNTER — Other Ambulatory Visit: Payer: Self-pay

## 2019-11-11 ENCOUNTER — Ambulatory Visit (INDEPENDENT_AMBULATORY_CARE_PROVIDER_SITE_OTHER): Payer: 59 | Admitting: Allergy & Immunology

## 2019-11-11 ENCOUNTER — Encounter: Payer: Self-pay | Admitting: Allergy & Immunology

## 2019-11-11 DIAGNOSIS — J4540 Moderate persistent asthma, uncomplicated: Secondary | ICD-10-CM | POA: Diagnosis not present

## 2019-11-11 DIAGNOSIS — J0110 Acute frontal sinusitis, unspecified: Secondary | ICD-10-CM

## 2019-11-11 DIAGNOSIS — J302 Other seasonal allergic rhinitis: Secondary | ICD-10-CM

## 2019-11-11 DIAGNOSIS — T7800XD Anaphylactic reaction due to unspecified food, subsequent encounter: Secondary | ICD-10-CM

## 2019-11-11 DIAGNOSIS — J3089 Other allergic rhinitis: Secondary | ICD-10-CM

## 2019-11-11 MED ORDER — EPINEPHRINE 0.3 MG/0.3ML IJ SOAJ: 0.3000 mg | INTRAMUSCULAR | 2 refills | Status: DC | PRN

## 2019-11-11 MED ORDER — PREDNISONE 10 MG PO TABS: ORAL_TABLET | ORAL | 0 refills | Status: DC

## 2019-11-11 MED ORDER — CEFDINIR 300 MG PO CAPS: 300.0000 mg | ORAL_CAPSULE | Freq: Two times a day (BID) | ORAL | 0 refills | Status: AC

## 2019-11-11 NOTE — Patient Instructions (Addendum)
1. Seasonal and perennial allergic rhinitis - Continue with Allegra daily.  - Continue with fluticasone one spray per nostril twice daily as needed.   2. Anaphylactic shock due to food (shellfish)  - Continue with avoid shellfish. [name] refilled today.   3. Acute sinusitis  - Start cefdinir BID for one week. - Call us in 24-48 hours if there is no improvement and we can add on prednisone to the regimen.  4. Moderate persistent asthma, uncomplicated - We are not going to make any medication changes at this time.  - Daily controller medication(s): Symbicort 160/4.49mcg two puffs twice daily with spacer (try for at least once daily) - Prior to physical activity: Ventolin 2 puffs 10-15 minutes before physical activity. - Rescue medications: Ventolin 4 puffs every 4-6 hours as needed - Asthma control goals:  * Full participation in all desired activities (may need albuterol before activity) * Albuterol use two time or less a week on average (not counting use with activity) * Cough interfering with sleep two time or less a month * Oral steroids no more than once a year * No hospitalizations  4. Return in about 6 months (around 05/10/2020). This can be an in-person, a virtual Webex or a telephone follow up visit.   Please inform us of any Emergency Department visits, hospitalizations, or changes in symptoms. Call us before going to the ED for breathing or allergy symptoms since we might be able to fit you in for a sick visit. Feel free to contact us anytime with any questions, problems, or concerns.  It was a pleasure to talk to you today today!  Websites that have reliable patient information: 1. American Academy of Asthma, Allergy, and Immunology: www.aaaai.org 2. Food Allergy Research and Education (FARE): foodallergy.org 3. Mothers of Asthmatics: http://www.asthmacommunitynetwork.org 4. American College of Allergy, Asthma, and Immunology: www.acaai.org  "Like" [name] on Facebook and  Instagram for our latest updates!        Make sure you are registered to vote! If you have moved or changed any of your contact information, you will need to get this updated before voting!  In some cases, you MAY be able to register to vote online: [email or web address]

## 2019-11-11 NOTE — Progress Notes (Signed)
RE: Dawn Wu MRN: [id number] DOB: 1985/11/17 Date of Telemedicine Visit: 11/11/2019  Referring provider: Murlean Wu* Primary care provider: Reynaldo Wu, [place]  Chief Complaint: Sinus Problem (congestion, mild headache, runny nose, loss os taste and smell, no body aches, no fever or chills. no one else in household sick. ongoing for about 4 days now. no respiratory symptoms or shortness of breath. )   Telemedicine Follow Up Visit via Telephone: I connected with Dawn Wu for a follow up on 11/11/19 by Video Visit and verified that I am speaking with the correct person using two identifiers.   I discussed the limitations, risks, security and privacy concerns of performing an evaluation and management service by telephone and the availability of in person appointments. I also discussed with the patient that there may be a patient responsible charge related to this service. The patient expressed understanding and agreed to proceed.  Patient is at home accompanied.  Provider is at the office.  Visit start time: 1:27 PM Visit end time: 1:45 PM Insurance consent/check in by: Dawn Wu Medical consent and medical assistant/nurse: Dawn Wu  History of Present Illness:  She is a 34 y.o. female, who is being followed for persistent asthma as well as allergic rhinitis and food allergy (shellfish). Her previous allergy office visit was in February 2020 with myself.  She was last seen in February 2020.  At that time, we continued her allergy shots as well as Nasacort.  We also refilled her Auvi-Q epinephrine autoinjector.  Her lung testing was slightly low, but she was experiencing an asthma exacerbation.  She continued on Symbicort 160/4.5 mcg 2 puffs twice daily with a spacer as well as albuterol as needed.  In the interim, she did give birth to her second son Dawn Wu.  He growing well and aside from a mild heart defect, he is very healthy. 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   She tells me today that she got sick. She took a shower and went outside. She has had symptoms. She has been using ibuprofen, hot tea, Allegra, albuterol, without much relief. She has had no fever. No COVID-19 exposures. Her last antibiotic was 3-4 weeks ago, but this was for mastitis (Keflex) but she did not complete the course of antibiotics.  Asthma/Respiratory Symptom History: Asthma is well controlled. She is using the inhaler here and there, but more over the last couple of days. She is not using Symbicort routinely at all. This regimen seem sto be working well for her. ACT is 13, indicating poor asthma control. But this is just indicative of her recent illness. Prior to this illness, she was actually doing very well.   Allergic Rhinitis Symptom History: She remains on the Allegra 1-2 times daily. She does have a nose spray, but she is not using it at all. She is no longer on the allergen immunotherapy.   Food Allergy Symptom History: She continues to avoid shellfish. She does need a new AuviQ.She is not interested in retesting at all.   Otherwise, there have been no changes to her past medical history, surgical history, family history, or social history.  Assessment and Plan:  Dawn Wu is a 34 y.o. female with:  Seasonal and perennial allergic rhinitis (grasses, weeds, trees, molds, dust mite, cat, cockroach)  Anaphylactic shock due to food (shellfish)  Moderate persistent asthma, uncomplicated  Acute sinusitis    1. Seasonal and perennial allergic rhinitis - Continue with  Allegra daily.  - Continue with fluticasone one spray per nostril twice daily as needed.   2. Anaphylactic shock due to food (shellfish)  - Continue with avoid shellfish. Dawn Wu refilled today.   3. Acute sinusitis  - Start cefdinir BID for one week. - Call us in 24-48 hours if there is no improvement and we can add on prednisone to the regimen.  4.  Moderate persistent asthma, uncomplicated - We are not going to make any medication changes at this time.  - Daily controller medication(s): Symbicort 160/4.42mcg two puffs twice daily with spacer (try for at least once daily) - Prior to physical activity: Ventolin 2 puffs 10-15 minutes before physical activity. - Rescue medications: Ventolin 4 puffs every 4-6 hours as needed - Asthma control goals:  * Full participation in all desired activities (may need albuterol before activity) * Albuterol use two time or less a week on average (not counting use with activity) * Cough interfering with sleep two time or less a month * Oral steroids no more than once a year * No hospitalizations  4. Return in about 6 months (around 05/10/2020). This can be an in-person, a virtual Webex or a telephone follow up visit.   Diagnostics: None.  Medication List:  Current Outpatient Medications  Medication Sig Dispense Refill   albuterol (VENTOLIN HFA) 108 (90 Base) MCG/ACT inhaler Inhale 1-2 puffs into the lungs every 6 (six) hours as needed for wheezing or shortness of breath. 18 g 1   budesonide-formoterol (SYMBICORT) 160-4.5 MCG/ACT inhaler Inhale 2 puffs into the lungs 2 (two) times daily. 1 Inhaler 5   EPINEPHrine 0.3 mg/0.3 mL IJ SOAJ injection      ibuprofen (ADVIL) 600 MG tablet Take 1 tablet (600 mg total) by mouth every 6 (six) hours as needed. 30 tablet 0   Nitroglycerin (RECTIV) 0.4 % OINT Place rectally.     Prenatal Vit-Fe Fumarate-FA (PRENATAL VITAMINS PO) Take by mouth.     cefdinir (OMNICEF) 300 MG capsule Take 1 capsule (300 mg total) by mouth 2 (two) times daily for 7 days. 14 capsule 0   predniSONE (DELTASONE) 10 MG tablet Take two tablets (20mg ) twice daily for three days, then one tablet (10mg ) twice daily for three days, then STOP. 18 tablet 0   No current facility-administered medications for this visit.   Allergies: Allergies  Allergen Reactions   Shellfish Allergy  Anaphylaxis   Nuvaring [Etonogestrel-Ethinyl Estradiol] Hives   I reviewed her past medical history, social history, family history, and environmental history and no significant changes have been reported from previous visits.  Review of Systems  Constitutional: Negative for activity change, appetite change, chills, fatigue and fever.  HENT: Positive for postnasal drip, rhinorrhea, sinus pressure, sneezing and sore throat. Negative for congestion, ear discharge, ear pain and facial swelling.   Eyes: Negative for pain, discharge, redness and itching.  Respiratory: Negative for shortness of breath, wheezing and stridor.   Gastrointestinal: Negative for diarrhea, nausea and vomiting.  Endocrine: Negative for cold intolerance and heat intolerance.  Musculoskeletal: Negative for arthralgias, joint swelling and myalgias.  Skin: Negative for rash.  Allergic/Immunologic: Positive for environmental allergies. Negative for food allergies and immunocompromised state.  Psychiatric/Behavioral: Positive for decreased concentration.    Objective:  Physical exam not obtained as encounter was done via telephone.   Previous notes and tests were reviewed.  I discussed the assessment and treatment plan with the patient. The patient was provided an opportunity to ask questions and all were  answered. The patient agreed with the plan and demonstrated an understanding of the instructions.   The patient was advised to call back or seek an in-person evaluation if the symptoms worsen or if the condition fails to improve as anticipated.  I provided 18 minutes of non-face-to-face time during this encounter.  It was my pleasure to participate in [place] care today. Please feel free to contact me with any questions or concerns.   Sincerely,  Valentina Shaggy, MD

## 2020-01-13 ENCOUNTER — Telehealth: Payer: Self-pay

## 2020-01-13 MED ORDER — EPINEPHRINE 0.3 MG/0.3ML IJ SOAJ: 0.3000 mg | Freq: Once | INTRAMUSCULAR | 1 refills | Status: AC

## 2020-01-13 NOTE — Telephone Encounter (Signed)
Patient called requesting a refill on her EPI PEN.  [facility] in [facility]  Thanks

## 2020-01-13 NOTE — Telephone Encounter (Signed)
Epi pen sent to pharmacy patient notify

## 2020-01-18 ENCOUNTER — Telehealth: Payer: Self-pay | Admitting: Allergy & Immunology

## 2020-01-18 NOTE — Telephone Encounter (Signed)
Called the patient and with her insurance deductible the cost is $400.00. Spoke with Print production planner, Beth and we are able to give her an Auvi Q .30mg  that expires 03/24/2020. 03/26/2020 has been placed up front for the patient to come pick up in our [place] office. Called and left a voicemail asking for patient to call back to inform.

## 2020-01-18 NOTE — Telephone Encounter (Signed)
Patient called stating her epipen is costing her $400 and she wants to know if there is a more cost affective alternative.

## 2020-01-19 NOTE — Telephone Encounter (Signed)
Called and spoke with the patient and she is going to have her niece Angie come and pick up the EpiPen for her today.

## 2020-03-22 ENCOUNTER — Ambulatory Visit (INDEPENDENT_AMBULATORY_CARE_PROVIDER_SITE_OTHER): Payer: 59 | Admitting: Allergy & Immunology

## 2020-03-22 ENCOUNTER — Other Ambulatory Visit: Payer: Self-pay

## 2020-03-22 ENCOUNTER — Encounter: Payer: Self-pay | Admitting: Allergy & Immunology

## 2020-03-22 DIAGNOSIS — J3089 Other allergic rhinitis: Secondary | ICD-10-CM

## 2020-03-22 DIAGNOSIS — T7800XD Anaphylactic reaction due to unspecified food, subsequent encounter: Secondary | ICD-10-CM | POA: Diagnosis not present

## 2020-03-22 DIAGNOSIS — J4540 Moderate persistent asthma, uncomplicated: Secondary | ICD-10-CM | POA: Diagnosis not present

## 2020-03-22 DIAGNOSIS — J302 Other seasonal allergic rhinitis: Secondary | ICD-10-CM

## 2020-03-22 DIAGNOSIS — J0100 Acute maxillary sinusitis, unspecified: Secondary | ICD-10-CM | POA: Diagnosis not present

## 2020-03-22 MED ORDER — IPRATROPIUM BROMIDE 0.06 % NA SOLN: 2.0000 | Freq: Four times a day (QID) | NASAL | 0 refills | Status: DC

## 2020-03-22 MED ORDER — DOXYCYCLINE MONOHYDRATE 100 MG PO TABS: 100.0000 mg | ORAL_TABLET | Freq: Two times a day (BID) | ORAL | 0 refills | Status: AC

## 2020-03-22 NOTE — Progress Notes (Signed)
RE: Dawn Wu MRN: [id number] DOB: [birth year] Date of Telemedicine Visit: 03/22/2020  Referring provider: Josefine Class* Primary care provider: Laurine Blazer, [place]  Chief Complaint: Sinus Problem (1 week ) and Gastroesophageal Reflux   Telemedicine Follow Up Visit via Telephone: I connected with [name] for a follow up on 03/22/20 by telephone and verified that I am speaking with the correct person using two identifiers.   I discussed the limitations, risks, security and privacy concerns of performing an evaluation and management service by telephone and the availability of in person appointments. I also discussed with the patient that there may be a patient responsible charge related to this service. The patient expressed understanding and agreed to proceed.  Patient is at home.  Provider is at the office.  Visit start time: 8:50 AM Visit end time: 9:12 AM Insurance consent/check in by: [place] consent and medical assistant/nurse: Garlon Hatchet  History of Present Illness:  She is a 34 y.o. female, who is being followed for persistent asthma as well as seasonal and perennial allergic rhinitis and shellfish allergy. Her previous allergy office visit was in January 2021 with myself.  At that visit, we started her on cefdinir twice a day for 1 week.  For her asthma, we continue with Symbicort 2 puffs twice daily with a spacer.  She was having trouble using it twice a day, so I recommended at least once a day to keep things under control.  For her rhinitis, would continued with Allegra and Flonase.  We refilled her Auvi-Q for her history of shellfish allergy.  Since last visit, she has mostly done well.  However, she is here today to discuss a possible sinus infection. She reports that she has had rhinorrhea. She has had this for around five days and worsening in intensity. She has tried using nasal saline as well as fluticasone 1-2 times per day. She woke up  this morning with left eye swelling. She denies any pain with eye movement. It is itching like crazy. She is having no sinus pressure or fever.   Asthma is good since having the baby. She has PRN albuterol but has not used in a bit.  She has not been using her Symbicort at all.  ACT score is 25, indicating excellent asthma control.  She has not been to the emergency room, urgent care, or required prednisone.  She continues to avoid shellfish.  She has good about telling the weight staff at restaurants that she is allergic.  Her epinephrine autoinjector is up-to-date from the last time we saw her.  She did start her Pfizer vaccine series for COVID-19.  Her second dose is next Monday.  She wants to be sure that she can get it and wants to stay ahead of what ever illness she is experiencing now.  Otherwise, there have been no changes to her past medical history, surgical history, family history, or social history.  Assessment and Plan:  Daffney is a 34 y.o. 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  I would start her on prednisone under normal circumstances, but since she is getting her second Pfizer vaccine next week, I do not want to give her prednisone since this can blunt her immune response to the vaccination.  She is in agreement  with this plan.  Because of this, we are going to start an antibiotic with some anti-inflammatory effects such as doxycycline.  Hopefully this will help control her symptoms while allowing her to get her second Pfizer vaccine as well.  She is going to update [name] in the the next few days.  Diagnostics: None.  Medication List:  Current Outpatient Medications  Medication Sig Dispense Refill  .  albuterol (VENTOLIN HFA) 108 (90 Base) MCG/ACT inhaler Inhale 1-2 puffs into the lungs every 6 (six) hours as needed for wheezing or shortness of breath. 18 g 1  . ibuprofen (ADVIL) 600 MG tablet Take 1 tablet (600 mg total) by mouth every 6 (six) hours as needed. 30 tablet 0  . Nitroglycerin (RECTIV) 0.4 % OINT Place rectally.    . Prenatal Vit-Fe Fumarate-FA (PRENATAL VITAMINS PO) Take by mouth.     No current facility-administered medications for this visit.   Allergies: Allergies  Allergen Reactions  . Shellfish Allergy Anaphylaxis  . Nuvaring [Etonogestrel-Ethinyl Estradiol] Hives   I reviewed her past medical history, social history, family history, and environmental history and no significant changes have been reported from previous visits.  Review of Systems  Constitutional: Negative for activity change and appetite change.  HENT: Positive for postnasal drip and rhinorrhea. Negative for congestion, sinus pressure, sinus pain and sore throat.   Eyes: Negative for pain, discharge, redness and itching.  Respiratory: Negative for shortness of breath, wheezing and stridor.   Gastrointestinal: Negative for diarrhea, nausea and vomiting.  Musculoskeletal: Negative for arthralgias, joint swelling and myalgias.  Skin: Negative for rash.  Allergic/Immunologic: Negative for environmental allergies and food allergies.    Objective:  Physical exam not obtained as encounter was done via telephone.   Previous notes and tests were reviewed.  I discussed the assessment and treatment plan with the patient. The patient was provided an opportunity to ask questions and all were answered. The patient agreed with the plan and demonstrated an understanding of the instructions.   The patient was advised to call back or seek an in-person evaluation if the symptoms worsen or if the condition fails to improve as anticipated.  I provided 22 minutes of non-face-to-face time during this encounter.  It  was my pleasure to participate in [place]'s care today. Please feel free to contact me with any questions or concerns.   Sincerely,  Alfonse Spruce, MD

## 2020-03-29 ENCOUNTER — Telehealth: Payer: Self-pay

## 2020-03-29 MED ORDER — AMOXICILLIN-POT CLAVULANATE 875-125 MG PO TABS: 1.0000 | ORAL_TABLET | Freq: Two times a day (BID) | ORAL | 0 refills | Status: AC

## 2020-03-29 NOTE — Telephone Encounter (Signed)
Patient is still under the weather. She is having swelling eyes, runny nose, cough, congestion in her chest.  Patient started her Doxycycline, but she seems to be getting worse.  Patient did not go get her 2nd Covid Vaccine due to her being sick. [facility]

## 2020-03-29 NOTE — Telephone Encounter (Signed)
I am sending in Augmentin twice a day for 10 days instead.  Patient notified.  Still prefer to avoid prednisone since this can blunt any response to a vaccine.  [name], MD [facility]

## 2020-06-20 IMAGING — US US MFM OB DETAIL +14 WK
1 series · 12 of 28 positions shown · non-contrast
Comparison: none

[Series 1: us mfm ob detail +14 wk · 12 of 90 slices shown]
[im 4/90]
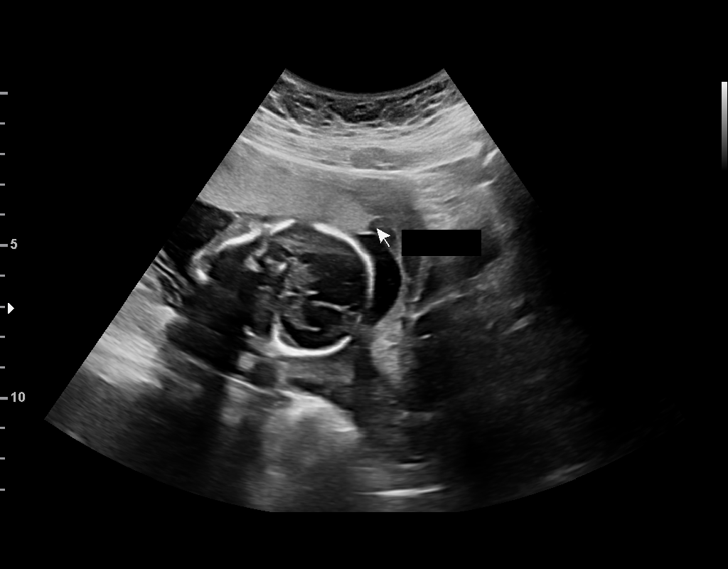
[im 10/90]
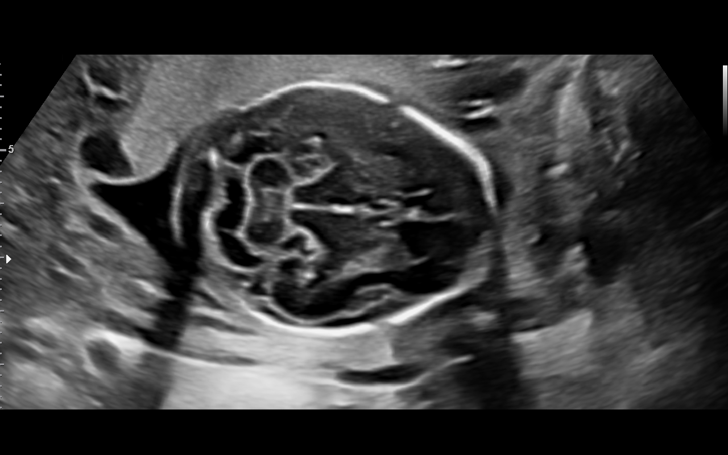
[im 17/90]
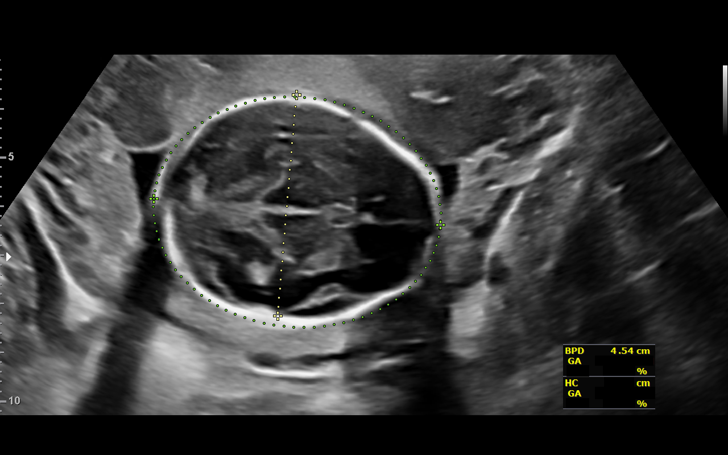
[im 27/90]
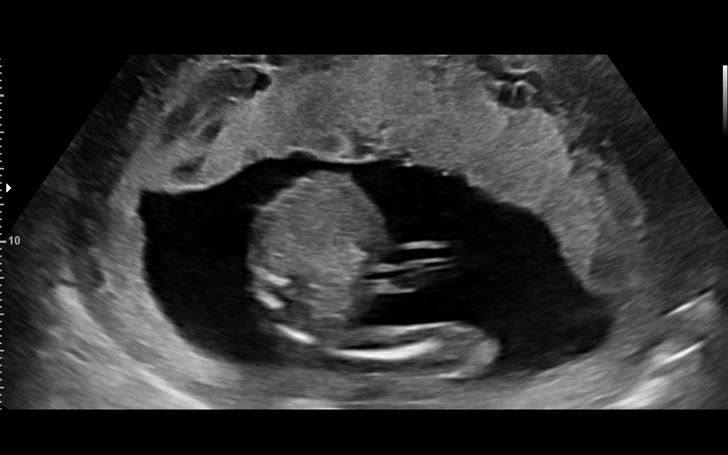
[im 33/90]
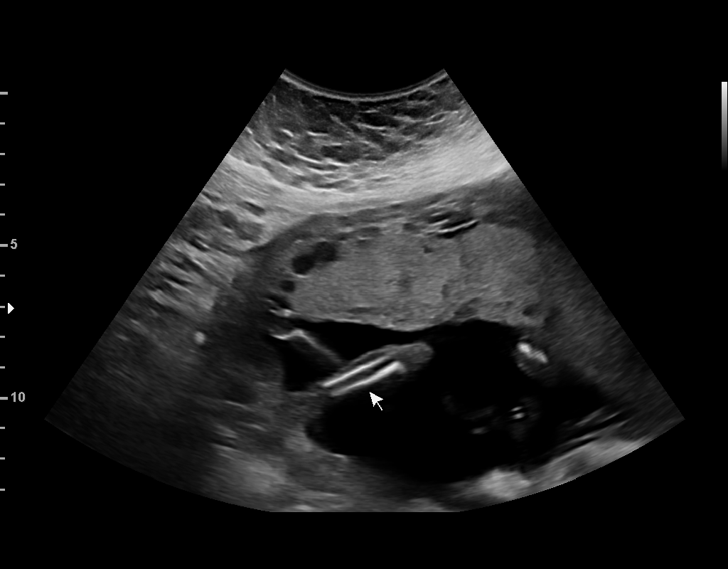
[im 40/90]
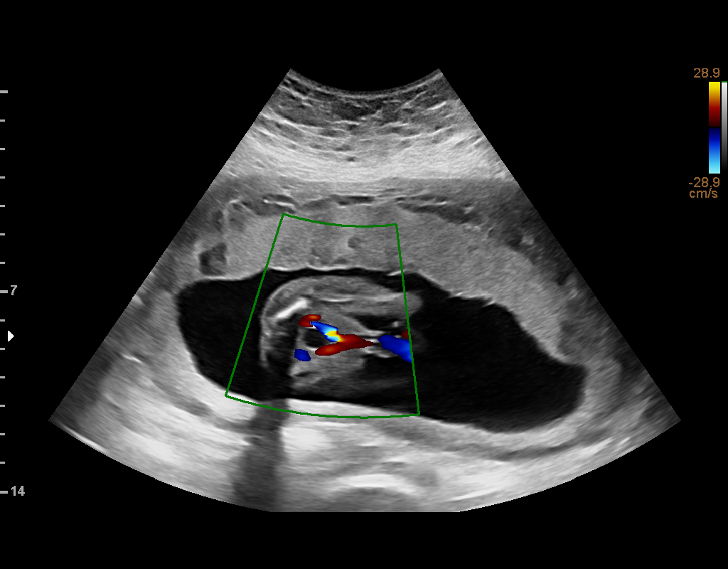
[im 50/90]
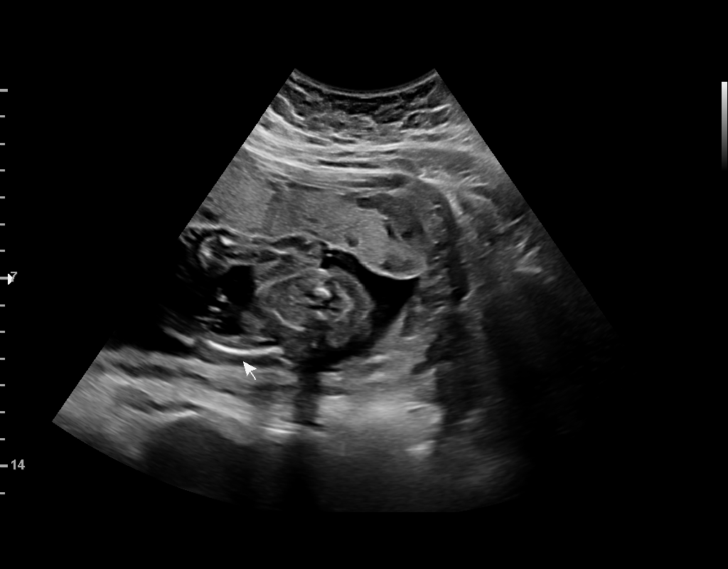
[im 57/90]
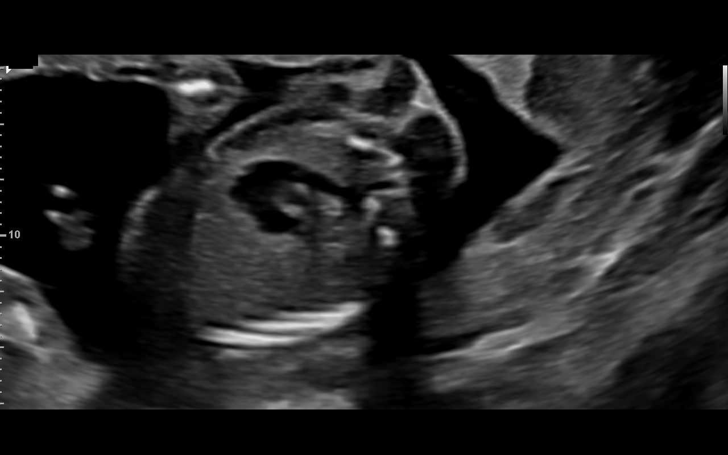
[im 63/90]
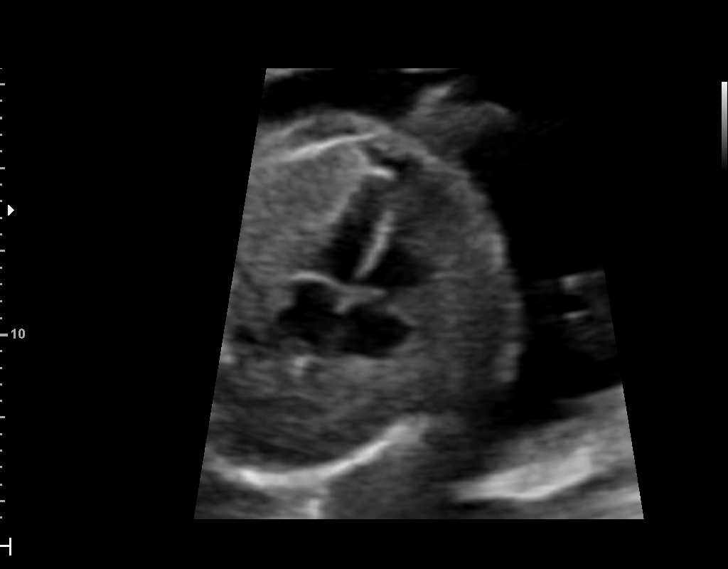
[im 73/90]
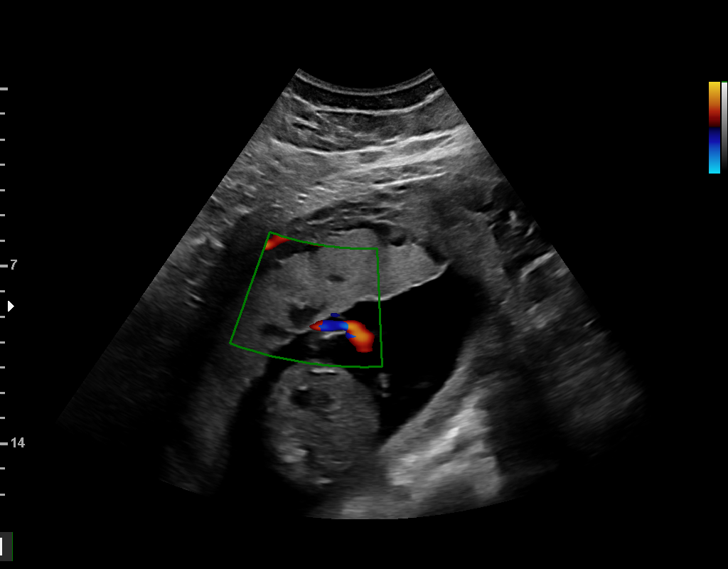
[im 80/90]
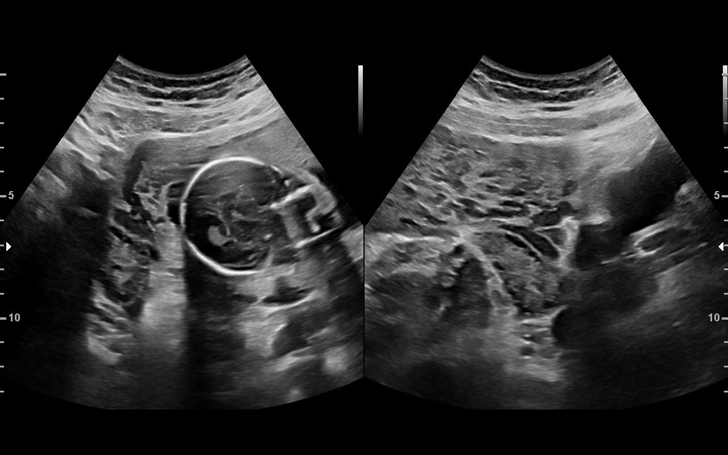
[im 86/90]
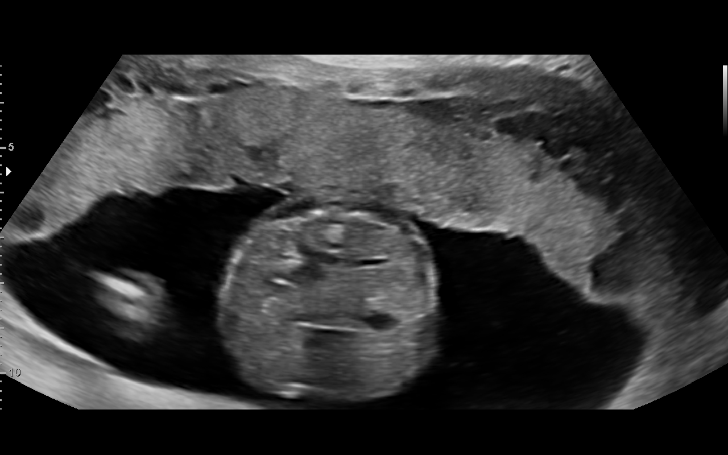

[12 of 28 positions shown; findings below may reference images not displayed]

MOOLMAN CNM

 ----------------------------------------------------------------------

 ----------------------------------------------------------------------
Indications

  Fetal abnormality - other known or
  suspected (thickened nuchal fold)
  Encounter for antenatal screening for
  malformations
  19 weeks gestation of pregnancy
 ----------------------------------------------------------------------
Vital Signs

 BMI:
Fetal Evaluation

 Num Of Fetuses:         1
 Fetal Heart Rate(bpm):  131
 Cardiac Activity:       Observed
 Presentation:           Cephalic
 Placenta:               Anterior
 P. Cord Insertion:      Visualized

 Amniotic Fluid
 AFI FV:      Within normal limits

                             Largest Pocket(cm)

Biometry

 BPD:      45.3  mm     G. Age:  19w 5d         79  %    CI:         75.2   %    70 - 86
                                                         FL/HC:      17.0   %    16.1 -
 HC:      165.7  mm     G. Age:  19w 2d         56  %    HC/AC:      1.09        1.09 -
 AC:      151.5  mm     G. Age:  20w 2d         85  %    FL/BPD:     62.3   %
 FL:       28.2  mm     G. Age:  18w 5d         31  %    FL/AC:      18.6   %    20 - 24
 HUM:      28.9  mm     G. Age:  19w 3d         60  %
 CER:      19.2  mm     G. Age:  18w 4d         38  %
 NFT:       7.3  mm

 LV:          7  mm
 CM:          4  mm

 Est. FW:     299  gm    0 lb 11 oz      54  %
OB History

 Gravidity:    2         Term:   1
 Living:       1
Gestational Age

 LMP:           19w 0d        Date:  10/12/18                 EDD:   07/19/19
 U/S Today:     19w 4d                                        EDD:   07/15/19
 Best:          19w 0d     Det. By:  LMP  (10/12/18)          EDD:   07/19/19
Anatomy

 Cranium:               Appears normal         LVOT:                   Not well visualized
 Cavum:                 Appears normal         Aortic Arch:            Not well visualized
 Ventricles:            Appears normal         Ductal Arch:            Not well visualized
 Choroid Plexus:        Appears normal         Diaphragm:              Not well visualized
 Cerebellum:            Appears normal         Stomach:                Appears normal, left
                                                                       sided
 Posterior Fossa:       Appears normal         Abdomen:                Appears normal
 Nuchal Fold:           Appears enlarged,      Abdominal Wall:         Appears nml (cord
                        7 mm
                                                                       insert, abd wall)
 Face:                  Profile nl; orbits not Cord Vessels:           Appears normal (3
                        well visualized                                vessel cord)
 Lips:                  Appears normal         Kidneys:                Appear normal
 Palate:                Not well visualized    Bladder:                Appears normal
 Thoracic:              Appears normal         Spine:                  Not well visualized
 Heart:                 Not well visualized    Upper Extremities:      Appears normal
 RVOT:                  Appears normal         Lower Extremities:      Appears normal

 Other:  Parents do not wish to know sex of fetus. Heels visualized. Nasal
         bone visualized. Technically difficult due to maternal habitus and fetal
         position.
Cervix Uterus Adnexa

 Cervix
 Length:           3.19  cm.

 Uterus
 No abnormality visualized.

 Left Ovary
 Within normal limits.

 Right Ovary
 Within normal limits.
Impression

 Ms. Angelakis is here for fetal anatomy scan. She has not had
 screening for fetal aneuploidies.
 We performed a fetal anatomy scan.  The nuchal-fold
 thickness is increased (7 millimeters). Nasal bone is seen. No
 other markers of aneuploidies or fetal structural defects are
 seen. Fetal biometry is consistent with her previously-
 established dates. Amniotic fluid is normal and good fetal
 activity is seen. Patient understands the limitations of
 ultrasound in detecting fetal anomalies.

 I explained the significance of the finding of increased nuchal-
 fold thickness that it is strongly associated with fetal Down
 syndrome.

 I discussed the following options: 1) Maternal blood cell-free
 fetal DNA screening  for trisomies 21, 18 and 13, which has a
 greater detection rate than conventional screening tests. I
 informed the patient that not all chromosomal malformations
 are detected by this test. 2) I informed her that only
 amniocentesis will give a definitive result on the fetal
 karyotype. I discussed a procedure-related pregnancy loss of
 about 1 in 500.
 Patient opted to have cell-free fetal DNA screening.

 Blood was drawn for cell-free fetal DNA screening today at
 our office ([facility]). We will communicate the results to
 the patient and scan a copy into the EMR.
Recommendations

 -An appointment was made for her to return in 4 weeks for
 completion of fetal anatomy.
                 Brar, Danie

## 2020-10-20 ENCOUNTER — Ambulatory Visit (INDEPENDENT_AMBULATORY_CARE_PROVIDER_SITE_OTHER): Payer: 59 | Admitting: Allergy & Immunology

## 2020-10-20 ENCOUNTER — Encounter: Payer: Self-pay | Admitting: Allergy & Immunology

## 2020-10-20 ENCOUNTER — Other Ambulatory Visit: Payer: Self-pay

## 2020-10-20 DIAGNOSIS — J4540 Moderate persistent asthma, uncomplicated: Secondary | ICD-10-CM | POA: Diagnosis not present

## 2020-10-20 DIAGNOSIS — T7800XD Anaphylactic reaction due to unspecified food, subsequent encounter: Secondary | ICD-10-CM

## 2020-10-20 DIAGNOSIS — J302 Other seasonal allergic rhinitis: Secondary | ICD-10-CM

## 2020-10-20 DIAGNOSIS — J0100 Acute maxillary sinusitis, unspecified: Secondary | ICD-10-CM

## 2020-10-20 DIAGNOSIS — J3089 Other allergic rhinitis: Secondary | ICD-10-CM | POA: Diagnosis not present

## 2020-10-20 MED ORDER — PREDNISONE 10 MG PO TABS: ORAL_TABLET | ORAL | 0 refills | Status: DC

## 2020-10-20 MED ORDER — ALBUTEROL SULFATE HFA 108 (90 BASE) MCG/ACT IN AERS: 1.0000 | INHALATION_SPRAY | Freq: Four times a day (QID) | RESPIRATORY_TRACT | 1 refills | Status: DC | PRN

## 2020-10-20 MED ORDER — CEFDINIR 300 MG PO CAPS: 300.0000 mg | ORAL_CAPSULE | Freq: Two times a day (BID) | ORAL | 0 refills | Status: AC

## 2020-10-20 NOTE — Patient Instructions (Signed)
1. Seasonal and perennial allergic rhinitis - Continue with Allegra daily.  - Continue with fluticasone one spray per nostril twice daily as needed.   2. Anaphylactic shock due to food (shellfish)  - Continue with avoid shellfish. [name] refilled today.   3. Acute sinusitis  - Start cefdinir BID for one week. - Call us in 24-48 hours if there is no improvement and we can add on prednisone to the regimen.  4. Moderate persistent asthma, uncomplicated - We are not going to make any medication changes at this time.  - Daily controller medication(s): Symbicort 160/4.20mcg two puffs twice daily with spacer (try for at least once daily) - Prior to physical activity: Ventolin 2 puffs 10-15 minutes before physical activity. - Rescue medications: Ventolin 4 puffs every 4-6 hours as needed - Asthma control goals:  * Full participation in all desired activities (may need albuterol before activity) * Albuterol use two time or less a week on average (not counting use with activity) * Cough interfering with sleep two time or less a month * Oral steroids no more than once a year * No hospitalizations  4. No follow-ups on file. This can be an in-person, a virtual Webex or a telephone follow up visit.   Please inform us of any Emergency Department visits, hospitalizations, or changes in symptoms. Call us before going to the ED for breathing or allergy symptoms since we might be able to fit you in for a sick visit. Feel free to contact us anytime with any questions, problems, or concerns.  It was a pleasure to talk to you today today!  Websites that have reliable patient information: 1. American Academy of Asthma, Allergy, and Immunology: www.aaaai.org 2. Food Allergy Research and Education (FARE): foodallergy.org 3. Mothers of Asthmatics: http://www.asthmacommunitynetwork.org 4. American College of Allergy, Asthma, and Immunology: www.acaai.org  "Like" [name] on Facebook and Instagram for our latest  updates!        Make sure you are registered to vote! If you have moved or changed any of your contact information, you will need to get this updated before voting!  In some cases, you MAY be able to register to vote online: [email or web address]

## 2020-10-20 NOTE — Progress Notes (Signed)
RE: Dawn Wu MRN: [id number] DOB: June 16, 1986 Date of Telemedicine Visit: 10/20/2020  Referring provider: Murlean Hark* Primary care provider: Reynaldo Minium, [place]  Chief Complaint: Cough   Telemedicine Follow Up Visit via Telephone: I connected with Dawn Wu for a follow up on 10/20/20 by telephone and verified that I am speaking with the correct person using two identifiers.   I discussed the limitations, risks, security and privacy concerns of performing an evaluation and management service by telephone and the availability of in person appointments. I also discussed with the patient that there may be a patient responsible charge related to this service. The patient expressed understanding and agreed to proceed.  Patient is at home.  Provider is at the office.  Visit start time: 11:16 AM Visit end time: 11:32 AM Insurance consent/check in by: [facility] Medical consent and medical assistant/nurse: Wynona Canes  History of Present Illness:  She is a 34 y.o. female, who is being followed for asthma as well as shellfish allergy and perennial allergic rhinitis. Her previous allergy office visit was in May 2021 with myself.  At that visit, we diagnosed her with sinusitis.  I do not want to give her prednisone since she was scheduled to get her second vaccine the following week.  We decided to start doxycycline, which has some anti-inflammatory effect.  However, a few days later, she ended up continuing to be ill so we recommended COVID-19 testing which was positive.  Thankfully, she did not need to go to the hospital.  Since the last visit, she has done well.  She has received both of her Pfizer vaccines.  She has not received her booster yet.  She is here for a sick visit.  She reports that she has had a cough since Tuesday of last week.  It became worse over the weekend.  She she was doing better, but she has actually gotten a little worse over the past day or 2.  She  reports that her chest is "drowning".  She has been using Mucinex as well as Robitussin for the cough, which is providing minimal relief.  She is drinking a lot of liquids and kids with me that she is also using tequila.  It seems that that last part is a joke.  She denies any sick contacts, but her oldest is in daycare and likes to bring home a lot viruses.  She has been using her rescue inhaler more often than not over the past week or so.  Aside from the recent rescue inhaler use, she has not been needing her rescue inhaler much at all.  She has not needed prednisone in over a year. She has not needed antibiotics in quite some time.  Otherwise, there have been no changes to her past medical history, surgical history, family history, or social history.  Assessment and Plan:  Jaelle is a 34 y.o. 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  Hopefully this will help clear her up more quickly.  She is going to continue with Mucinex as well as albuterol.  She is going to give [name] an update in 1 to 2 days.  I would like to see her in clinic at some point so that we can do a spirometry, but  we need to get her feeling better in the meantime.  Diagnostics: None.  Medication List:  Current Outpatient Medications  Medication Sig Dispense Refill  . ibuprofen (ADVIL) 600 MG tablet Take 1 tablet (600 mg total) by mouth every 6 (six) hours as needed. 30 tablet 0  . ipratropium (ATROVENT) 0.06 % nasal spray Place 2 sprays into both nostrils 4 (four) times daily. 15 mL 0  . metFORMIN (GLUCOPHAGE-XR) 750 MG 24 hr tablet Take 750 mg by mouth daily.    . Nitroglycerin (RECTIV) 0.4 % OINT Place rectally.    . norethindrone  (MICRONOR) 0.35 MG tablet Take 1 tablet by mouth daily.    . Prenatal Vit-Fe Fumarate-FA (PRENATAL VITAMINS PO) Take by mouth.    Marland Kitchen albuterol (VENTOLIN HFA) 108 (90 Base) MCG/ACT inhaler Inhale 1-2 puffs into the lungs every 6 (six) hours as needed for wheezing or shortness of breath. 18 g 1  . cefdinir (OMNICEF) 300 MG capsule Take 1 capsule (300 mg total) by mouth 2 (two) times daily for 10 days. 20 capsule 0  . predniSONE (DELTASONE) 10 MG tablet Take two tablets (20mg ) twice daily for three days, then one tablet (10mg ) twice daily for three days, then STOP. 18 tablet 0   No current facility-administered medications for this visit.   Allergies: Allergies  Allergen Reactions  . Shellfish Allergy Anaphylaxis  . Nuvaring [Etonogestrel-Ethinyl Estradiol] Hives   I reviewed her past medical history, social history, family history, and environmental history and no significant changes have been reported from previous visits.  Review of Systems  Constitutional: Negative.  Negative for fever.  HENT: Positive for postnasal drip, rhinorrhea and sinus pressure. Negative for congestion, ear discharge and ear pain.   Eyes: Negative for pain, discharge and redness.  Respiratory: Negative for cough, shortness of breath and wheezing.   Cardiovascular: Negative.  Negative for chest pain and palpitations.  Gastrointestinal: Negative for abdominal pain.  Skin: Negative.  Negative for rash.  Allergic/Immunologic: Negative for environmental allergies.  Neurological: Negative for dizziness and headaches.  Hematological: Does not bruise/bleed easily.    Objective:  Physical exam not obtained as encounter was done via telephone.   Previous notes and tests were reviewed.  I discussed the assessment and treatment plan with the patient. The patient was provided an opportunity to ask questions and all were answered. The patient agreed with the plan and demonstrated an understanding of the instructions.    The patient was advised to call back or seek an in-person evaluation if the symptoms worsen or if the condition fails to improve as anticipated.  I provided 16 minutes of non-face-to-face time during this encounter.  It was my pleasure to participate in Uvalda Dicaprio's care today. Please feel free to contact me with any questions or concerns.   Sincerely,  , MD

## 2021-01-23 ENCOUNTER — Other Ambulatory Visit: Payer: Self-pay

## 2021-01-23 ENCOUNTER — Ambulatory Visit (INDEPENDENT_AMBULATORY_CARE_PROVIDER_SITE_OTHER): Payer: Medicaid Other | Admitting: Allergy & Immunology

## 2021-01-23 ENCOUNTER — Encounter: Payer: Self-pay | Admitting: Allergy & Immunology

## 2021-01-23 DIAGNOSIS — T7800XD Anaphylactic reaction due to unspecified food, subsequent encounter: Secondary | ICD-10-CM | POA: Diagnosis not present

## 2021-01-23 DIAGNOSIS — O99511 Diseases of the respiratory system complicating pregnancy, first trimester: Secondary | ICD-10-CM

## 2021-01-23 DIAGNOSIS — J4540 Moderate persistent asthma, uncomplicated: Secondary | ICD-10-CM | POA: Diagnosis not present

## 2021-01-23 DIAGNOSIS — J3089 Other allergic rhinitis: Secondary | ICD-10-CM | POA: Diagnosis not present

## 2021-01-23 DIAGNOSIS — J302 Other seasonal allergic rhinitis: Secondary | ICD-10-CM

## 2021-01-23 DIAGNOSIS — J45909 Unspecified asthma, uncomplicated: Secondary | ICD-10-CM

## 2021-01-23 MED ORDER — BUDESONIDE 180 MCG/ACT IN AEPB: 1.0000 | INHALATION_SPRAY | Freq: Two times a day (BID) | RESPIRATORY_TRACT | 6 refills | Status: DC

## 2021-01-23 MED ORDER — FLUTICASONE PROPIONATE 50 MCG/ACT NA SUSP: 2.0000 | Freq: Every day | NASAL | 5 refills | Status: DC

## 2021-01-23 MED ORDER — EPINEPHRINE 0.3 MG/0.3ML IJ SOAJ: 0.3000 mg | INTRAMUSCULAR | 3 refills | Status: DC | PRN

## 2021-01-23 NOTE — Progress Notes (Signed)
RE: Dawn Wu MRN: [id number] DOB: 11/09/1985 Date of Telemedicine Visit: 01/23/2021  Referring provider: Murlean Hark* Primary care provider: Reynaldo Minium, [place]  Chief Complaint: No chief complaint on file.   Telemedicine Follow Up Visit via Telephone: I connected with Dawn Wu for a follow up on 01/23/21 by telephone and verified that I am speaking with the correct person using two identifiers.   I discussed the limitations, risks, security and privacy concerns of performing an evaluation and manage  [place] service by telephone and the availability of in person appointments. I also discussed with the patient that there may be a patient responsible charge related to this service. The patient expressed understanding and agreed to proceed.  Patient is at home.  Provider is at the office.  Visit start time: 10:45 AM Visit end time: 11:05 AM Insurance consent/check in by: [facility] consent and medical assistant/nurse: Dr. Reece Agar  History of Present Illness:  She is a 35 y.o. female, who is being followed for persistent asthma as well as allergic rhinitis and food allergies. Her previous allergy office visit was in December 2021 with myself.  At that time, she was experiencing a sinus infection.  We started her on prednisone and doxycycline.  We continued her on Mucinex as well as albuterol.  Since the last visit, she has mostly done well. She started having increased SOB and hacking since Saturday. One of her kids is improved and the other one is sick. She did not take a COVID test at all. Saturday was windy and then she woke up Sunday with throat pain. She has bene on Robitussin and Allegra.   She is now [redacted] weeks pregnant.  Her asthma tends to flare during pregnancy.  She also wanted to discuss this today.  She is open to coming in for a spirometry, but since she was so sick she was not sure that we would want her in the office.  However, she is  afebrile.  Asthma/Respiratory Symptom History: Prior to Saturday, her asthma was fairly well controlled. She did use her albuterol. She did have bad asthma with kiddo #2. She now feels that she is out of breath. She does have some antedates Symbicort from her last pregnancy.  After she gave birth, her symptoms largely resolved.  She has not been to the emergency room for her breathing.  She has not been on prednisone.   Allergic Rhinitis Symptom History: She is on a nasal spray. She does use an antihistamine every day.  She was on allergy shots at one point, but this was stopped years ago.  Food Allergy Symptom History: She is not eating shellfish. She does need a new EpiPen.  She has had no accidental exposures.  Otherwise, there have been no changes to her past medical history, surgical history, family history, or social history.  Assessment and Plan:  Dawn Wu is a 35 y.o. 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  While this might not be covered with her insurance plan, this is the only inhaled steroid that is possibly during pregnancy.  It is one of the older steroids and has the most data to back up its use.  I do not want to give her prednisone at  this point in time.  She is comfortable with that decision.  Obviously, if it gets worse, she is going to give [name] a call.  There is no indication for antibiotics.  She is going to get back on her nasal spray more religiously and use her antihistamine.  She also likely was up-to-date.  We did send in a new EpiPen for her history of shellfish allergy.  She is going to come by the [facility] office next week if not we can get a spirometry.  This is the closest office  to her home.    Diagnostics: None.  Medication List:  Current Outpatient Medications  Medication Sig Dispense Refill  . albuterol (VENTOLIN HFA) 108 (90 Base) MCG/ACT inhaler Inhale 1-2 puffs into the lungs every 6 (six) hours as needed for wheezing or shortness of breath. 18 g 1  . ibuprofen (ADVIL) 600 MG tablet Take 1 tablet (600 mg total) by mouth every 6 (six) hours as needed. 30 tablet 0  . ipratropium (ATROVENT) 0.06 % nasal spray Place 2 sprays into both nostrils 4 (four) times daily. 15 mL 0  . metFORMIN (GLUCOPHAGE-XR) 750 MG 24 hr tablet Take 750 mg by mouth daily.    . Nitroglycerin (RECTIV) 0.4 % OINT Place rectally.    . norethindrone (MICRONOR) 0.35 MG tablet Take 1 tablet by mouth daily.    . predniSONE (DELTASONE) 10 MG tablet Take two tablets (20mg ) twice daily for three days, then one tablet (10mg ) twice daily for three days, then STOP. 18 tablet 0  . Prenatal Vit-Fe Fumarate-FA (PRENATAL VITAMINS PO) Take by mouth.     No current facility-administered medications for this visit.   Allergies: Allergies  Allergen Reactions  . Shellfish Allergy Anaphylaxis  . Nuvaring [Etonogestrel-Ethinyl Estradiol] Hives   I reviewed her past medical history, social history, family history, and environmental history and no significant changes have been reported from previous visits.  Review of Systems  Constitutional: Negative for activity change and appetite change.  HENT: Negative for congestion, postnasal drip, rhinorrhea, sinus pressure and sore throat.   Eyes: Negative for pain, discharge, redness and itching.  Respiratory: Positive for cough and shortness of breath. Negative for wheezing and stridor.   Gastrointestinal: Negative for diarrhea, nausea and vomiting.  Endocrine: Negative for cold intolerance and heat intolerance.  Musculoskeletal: Negative for arthralgias, joint swelling and myalgias.  Skin: Negative for rash.  Allergic/Immunologic: Positive for  environmental allergies and food allergies.    Objective:  Physical exam not obtained as encounter was done via telephone.   Previous notes and tests were reviewed.  I discussed the assessment and treatment plan with the patient. The patient was provided an opportunity to ask questions and all were answered. The patient agreed with the plan and demonstrated an understanding of the instructions.   The patient was advised to call back or seek an in-person evaluation if the symptoms worsen or if the condition fails to improve as anticipated.  I provided 20 minutes of non-face-to-face time during this encounter.  It was my pleasure to participate in [place]'s care today. Please feel free to contact me with any questions or concerns.   Sincerely,  , MD

## 2021-01-26 ENCOUNTER — Telehealth: Payer: Self-pay | Admitting: *Deleted

## 2021-01-26 ENCOUNTER — Other Ambulatory Visit: Payer: Self-pay | Admitting: *Deleted

## 2021-01-26 ENCOUNTER — Encounter: Payer: Self-pay | Admitting: Allergy & Immunology

## 2021-01-26 MED ORDER — ALBUTEROL SULFATE HFA 108 (90 BASE) MCG/ACT IN AERS: 1.0000 | INHALATION_SPRAY | Freq: Four times a day (QID) | RESPIRATORY_TRACT | 1 refills | Status: DC | PRN

## 2021-01-26 NOTE — Telephone Encounter (Signed)
PA has been submitted through CoverMyMeds for Epipen, Albuterol, and Pulmicort and is currently pending approval/denial.

## 2021-01-26 NOTE — Telephone Encounter (Signed)
PA for Pulmicort and EpiPen have been approved and faxed to pharmacy. Albuterol PA was denied. Will need to wait for fax determination to see preferred alternatives. Patient has been made aware.

## 2021-01-29 ENCOUNTER — Other Ambulatory Visit: Payer: Self-pay | Admitting: *Deleted

## 2021-02-01 NOTE — Addendum Note (Signed)
Addended by: Rosana Fret L on: 02/01/2021 02:00 PM   Modules accepted: Orders

## 2021-02-01 NOTE — Progress Notes (Signed)
The patient came by the office today for a spirometry.  Her FEV1 was 2.56 (89%) and her FVC was 2.61 (77%) FEV1 to FVC ratio was 98%.  She was feeling better since starting on the Pulmicort 1 puff twice daily.  [name], MD [facility]

## 2021-02-02 ENCOUNTER — Telehealth: Payer: Self-pay | Admitting: Allergy & Immunology

## 2021-02-02 DIAGNOSIS — R0602 Shortness of breath: Secondary | ICD-10-CM

## 2021-02-02 MED ORDER — BUDESONIDE-FORMOTEROL FUMARATE 160-4.5 MCG/ACT IN AERO: 2.0000 | INHALATION_SPRAY | Freq: Two times a day (BID) | RESPIRATORY_TRACT | 5 refills | Status: DC

## 2021-02-02 MED ORDER — SPACER/AERO-HOLD CHAMBER BAGS MISC: 0 refills | Status: AC

## 2021-02-02 MED ORDER — PREDNISONE 10 MG PO TABS: ORAL_TABLET | ORAL | 0 refills | Status: DC

## 2021-02-02 NOTE — Telephone Encounter (Signed)
Patient called today to report that she is continuing to have SOB. She was SOB and her symptoms have gotten worse in the past week.  She is even endorsing some shortness of breath at rest.  She does have 2 children whom she chases around, but she thinks her symptoms are definitely out of proportion to the physical activity she is exuding.  She has no history of blood clots, but she is pregnant.  We are going to get a stat D-dimer.  This was ordered and I looked at the closest [facility] to her current location.  We are also starting a low-dose prednisone burst given the prolonged symptom course.  I also recommended stopping the Pulmicort and starting Symbicort 160 mcg 2 puffs twice daily.  We did send in a spacer.  She is aware of how to use the spacer.  [name], MD [facility]

## 2021-03-16 ENCOUNTER — Ambulatory Visit: Payer: Medicaid Other | Admitting: Allergy & Immunology

## 2021-03-19 ENCOUNTER — Ambulatory Visit: Payer: Self-pay | Admitting: Allergy & Immunology

## 2021-05-10 ENCOUNTER — Other Ambulatory Visit: Payer: Self-pay

## 2021-05-10 ENCOUNTER — Encounter: Payer: Self-pay | Admitting: Allergy & Immunology

## 2021-05-10 ENCOUNTER — Ambulatory Visit (INDEPENDENT_AMBULATORY_CARE_PROVIDER_SITE_OTHER): Payer: Medicaid Other | Admitting: Allergy & Immunology

## 2021-05-10 VITALS — BP 108/74 | HR 103 | Temp 97.5°F | Resp 20 | Ht 61.0 in | Wt 178.2 lb

## 2021-05-10 DIAGNOSIS — J302 Other seasonal allergic rhinitis: Secondary | ICD-10-CM

## 2021-05-10 DIAGNOSIS — O99512 Diseases of the respiratory system complicating pregnancy, second trimester: Secondary | ICD-10-CM

## 2021-05-10 DIAGNOSIS — J4540 Moderate persistent asthma, uncomplicated: Secondary | ICD-10-CM

## 2021-05-10 DIAGNOSIS — T7800XD Anaphylactic reaction due to unspecified food, subsequent encounter: Secondary | ICD-10-CM

## 2021-05-10 DIAGNOSIS — J45909 Unspecified asthma, uncomplicated: Secondary | ICD-10-CM

## 2021-05-10 DIAGNOSIS — J3089 Other allergic rhinitis: Secondary | ICD-10-CM

## 2021-05-10 NOTE — Progress Notes (Signed)
FOLLOW UP  Date of Service/Encounter:  05/10/21   Assessment:   Seasonal and perennial allergic rhinitis (grasses, weeds, trees, molds, dust mite, cat, cockroach)   Anaphylactic shock due to food (shellfish)   Moderate persistent asthma, uncomplicated   Asthma complicating pregnancy in the second trimester   Dawn Wu has done very well with the Pulmicort on board.  This seems to have been the trick for controlling her asthma symptoms during her pregnancy.  We are not can make any medication changes for now.  I think she would do well on the Symbicort, but she prefers the Pulmicort since it is class B during pregnancy.  I am completely comfortable with that and she knows when she needs to at the Symbicort.  We will see her in 3 months or later depending on when she ends up giving birth.  I did tell her we are available in the interim if needed for sick visits.  Plan/Recommendations:   1. Seasonal and perennial allergic rhinitis - Continue with Allegra daily.  - Continue with fluticasone one spray per nostril twice daily as needed.   2. Anaphylactic shock due to food (shellfish)  - Continue with avoid shellfish. Dawn Wu refilled today.   3. Moderate persistent asthma, uncomplicated - Lung testing looked excellent.  - Daily controller medication(s): Pulmicort two puffs twice daily - Prior to physical activity: albuterol 2 puffs 10-15 minutes before physical activity. - Rescue medications: albuterol 4 puffs every 4-6 hours as needed - Changes during respiratory infections or worsening symptoms: Add on Symbicort 160/4.69mcg to 2 puffs twice daily for TWO WEEKS. - Asthma control goals:  * Full participation in all desired activities (may need albuterol before activity) * Albuterol use two time or less a week on average (not counting use with activity) * Cough interfering with sleep two time or less a month * Oral steroids no more than once a year * No hospitalizations  3.  Follow up in three months or earlier if needed.   Subjective:   Dawn Wu is a 35 y.o. female presenting today for follow up of  Chief Complaint  Patient presents with   Follow-up   Asthma    Dawn Wu has a history of the following: Patient Active Problem List   Diagnosis Date Noted   Moderate persistent asthma, uncomplicated 01/23/2021   PCOS (polycystic ovarian syndrome) 08/15/2017   Hepatic steatosis 06/04/2017   Large liver 06/04/2017   Seasonal and perennial allergic rhinitis 11/25/2016   Anaphylactic shock due to adverse food reaction 11/25/2016   Urticaria, unspecified 04/07/2014   Obesity (BMI 30.0-34.9) 04/07/2014    History obtained from: chart review and patient.  Dawn Wu is a 35 y.o. female presenting for a follow up visit.  She was last seen in March 2022.  At that time, she followed up because she was having worsening asthma symptoms since becoming pregnant.  When she was not using any controller medications.  We started her on Pulmicort 2 puffs twice daily.  Since last visit, she has done very well.  She is now around [redacted] weeks gestation.  Asthma/Respiratory Symptom History: She remains on Pulmicort 2 puffs twice daily.  She had the Symbicort when she has flares.  She has not needed it since we last spoke.  She has not been using her rescue inhaler.  Overall, her asthma has been under better control with the Pulmicort on board.  She feels more comfortable using the Pulmicort since it is approved for use  during pregnancy.  Allergic Rhinitis Symptom History: Her allergic rhinitis has been well controlled with Rhinocort as needed.  She also uses an antihistamine on a as needed basis.  She has not had any problems with sinus infections.  Food Allergy Symptom History: She continues to avoid shellfish.  EpiPen is up-to-date.  Pregnancy is going well.  She is going to be having a girl.  Otherwise, there have been no changes to her past medical history,  surgical history, family history, or social history.    Review of Systems  Constitutional: Negative.  Negative for chills, fever, malaise/fatigue and weight loss.  HENT: Negative.  Negative for congestion, ear discharge and ear pain.   Eyes:  Negative for pain, discharge and redness.  Respiratory:  Negative for cough, sputum production, shortness of breath and wheezing.   Cardiovascular: Negative.  Negative for chest pain and palpitations.  Gastrointestinal:  Positive for nausea and vomiting. Negative for abdominal pain, constipation, diarrhea and heartburn.  Skin: Negative.  Negative for itching and rash.  Neurological:  Negative for dizziness and headaches.  Endo/Heme/Allergies:  Negative for environmental allergies. Does not bruise/bleed easily.      Objective:   Blood pressure 108/74, pulse (!) 103, temperature (!) 97.5 F (36.4 C), temperature source Temporal, resp. rate 20, height 5\' 1"  (1.549 m), weight 178 lb 3.2 oz (80.8 kg), SpO2 99 %, currently breastfeeding. Body mass index is 33.67 kg/m.   Physical Exam:  Physical Exam Constitutional:      Appearance: She is well-developed.  HENT:     Head: Normocephalic and atraumatic.     Right Ear: Tympanic membrane, ear canal and external ear normal.     Left Ear: Tympanic membrane, ear canal and external ear normal.     Nose: No nasal deformity, septal deviation, mucosal edema or rhinorrhea.     Right Turbinates: Enlarged and swollen.     Left Turbinates: Enlarged and swollen.     Right Sinus: No maxillary sinus tenderness or frontal sinus tenderness.     Left Sinus: No maxillary sinus tenderness or frontal sinus tenderness.     Mouth/Throat:     Mouth: Mucous membranes are not pale and not dry.     Pharynx: Uvula midline.  Eyes:     General: Lids are normal. No allergic shiner.       Right eye: No discharge.        Left eye: No discharge.     Conjunctiva/sclera: Conjunctivae normal.     Right eye: Right conjunctiva is  not injected. No chemosis.    Left eye: Left conjunctiva is not injected. No chemosis.    Pupils: Pupils are equal, round, and reactive to light.  Cardiovascular:     Rate and Rhythm: Normal rate and regular rhythm.     Heart sounds: Normal heart sounds.  Pulmonary:     Effort: Pulmonary effort is normal. No tachypnea, accessory muscle usage or respiratory distress.     Breath sounds: Normal breath sounds. No wheezing, rhonchi or rales.     Comments: Moving air well in all lung fields.  No increased work of breathing. Chest:     Chest wall: No tenderness.  Lymphadenopathy:     Cervical: No cervical adenopathy.  Skin:    Coloration: Skin is not pale.     Findings: No abrasion, erythema, petechiae or rash. Rash is not papular, urticarial or vesicular.  Neurological:     Mental Status: She is alert.  Psychiatric:  Behavior: Behavior is cooperative.     Diagnostic studies:    Spirometry: results normal (FEV1: 2.15/81%, FVC: 2.46/79%, FEV1/FVC: 87%).    Spirometry consistent with normal pattern.         [name], MD  [facility]

## 2021-05-13 ENCOUNTER — Encounter: Payer: Self-pay | Admitting: Allergy & Immunology

## 2021-05-13 NOTE — Patient Instructions (Signed)
1. Seasonal and perennial allergic rhinitis - Continue with Allegra daily.  - Continue with fluticasone one spray per nostril twice daily as needed.   2. Anaphylactic shock due to food (shellfish)  - Continue with avoid shellfish. Dawn Wu refilled today.   3. Moderate persistent asthma, uncomplicated - Lung testing looked excellent.  - Daily controller medication(s): Pulmicort two puffs twice daily - Prior to physical activity: albuterol 2 puffs 10-15 minutes before physical activity. - Rescue medications: albuterol 4 puffs every 4-6 hours as needed - Changes during respiratory infections or worsening symptoms: Add on Symbicort 160/4.33mcg to 2 puffs twice daily for TWO WEEKS. - Asthma control goals:  * Full participation in all desired activities (may need albuterol before activity) * Albuterol use two time or less a week on average (not counting use with activity) * Cough interfering with sleep two time or less a month * Oral steroids no more than once a year * No hospitalizations  3. Follow up in three months or earlier if needed.   Please inform us of any Emergency Department visits, hospitalizations, or changes in symptoms. Call us before going to the ED for breathing or allergy symptoms since we might be able to fit you in for a sick visit. Feel free to contact us anytime with any questions, problems, or concerns.  It was a pleasure to see you and your family again today!  Websites that have reliable patient information: 1. American Academy of Asthma, Allergy, and Immunology: www.aaaai.org 2. Food Allergy Research and Education (FARE): foodallergy.org 3. Mothers of Asthmatics: http://www.asthmacommunitynetwork.org 4. American College of Allergy, Asthma, and Immunology: www.acaai.org  "Like" [name] on Facebook and Instagram for our latest updates!        Make sure you are registered to vote! If you have moved or changed any of your contact information, you will need to  get this updated before voting!  In some cases, you MAY be able to register to vote online: [email or web address]

## 2021-05-15 ENCOUNTER — Ambulatory Visit: Payer: Medicaid Other | Admitting: Allergy & Immunology

## 2021-05-18 ENCOUNTER — Other Ambulatory Visit: Payer: Self-pay | Admitting: *Deleted

## 2021-05-18 MED ORDER — PROAIR HFA 108 (90 BASE) MCG/ACT IN AERS: 2.0000 | INHALATION_SPRAY | Freq: Four times a day (QID) | RESPIRATORY_TRACT | 1 refills | Status: DC | PRN

## 2021-08-08 ENCOUNTER — Other Ambulatory Visit: Payer: Self-pay

## 2021-08-08 MED ORDER — FLUTICASONE PROPIONATE 50 MCG/ACT NA SUSP: 2.0000 | Freq: Every day | NASAL | 5 refills | Status: DC

## 2022-03-26 ENCOUNTER — Ambulatory Visit: Payer: Medicaid Other | Admitting: Allergy & Immunology

## 2022-04-04 ENCOUNTER — Encounter: Payer: Self-pay | Admitting: Allergy & Immunology

## 2022-04-04 ENCOUNTER — Ambulatory Visit (INDEPENDENT_AMBULATORY_CARE_PROVIDER_SITE_OTHER): Payer: Medicaid Other | Admitting: Allergy & Immunology

## 2022-04-04 VITALS — BP 130/78 | HR 83 | Temp 96.8°F | Resp 16 | Ht 61.0 in | Wt 175.0 lb

## 2022-04-04 DIAGNOSIS — T7800XD Anaphylactic reaction due to unspecified food, subsequent encounter: Secondary | ICD-10-CM | POA: Diagnosis not present

## 2022-04-04 DIAGNOSIS — J302 Other seasonal allergic rhinitis: Secondary | ICD-10-CM

## 2022-04-04 DIAGNOSIS — J3089 Other allergic rhinitis: Secondary | ICD-10-CM | POA: Diagnosis not present

## 2022-04-04 DIAGNOSIS — J4540 Moderate persistent asthma, uncomplicated: Secondary | ICD-10-CM

## 2022-04-04 MED ORDER — IPRATROPIUM BROMIDE 0.06 % NA SOLN: 2.0000 | Freq: Four times a day (QID) | NASAL | 3 refills | Status: AC

## 2022-04-04 MED ORDER — CROMOLYN SODIUM 4 % OP SOLN: 2.0000 [drp] | Freq: Four times a day (QID) | OPHTHALMIC | 12 refills | Status: AC

## 2022-04-04 MED ORDER — FLUTICASONE PROPIONATE 50 MCG/ACT NA SUSP: 2.0000 | Freq: Every day | NASAL | 3 refills | Status: AC

## 2022-04-04 MED ORDER — EPINEPHRINE 0.3 MG/0.3ML IJ SOAJ: 0.3000 mg | INTRAMUSCULAR | 3 refills | Status: AC | PRN

## 2022-04-04 MED ORDER — OMEPRAZOLE 20 MG PO CPDR
20.0000 mg | DELAYED_RELEASE_CAPSULE | Freq: Every day | ORAL | 3 refills | Status: AC
Start: 2022-04-04 — End: 2022-07-03

## 2022-04-04 NOTE — Progress Notes (Signed)
   FOLLOW UP  Date of Service/Encounter:  04/04/22   Assessment:   Seasonal and perennial allergic rhinitis (grasses, weeds, trees, molds, dust mite, cat, cockroach)   Anaphylactic shock due to food (shellfish)   Moderate persistent asthma, uncomplicated   Asthma complicating pregnancy in the second trimester  Plan/Recommendations:    There are no Patient Instructions on file for this visit.   Subjective:   Dawn Wu is a 36 y.o. female presenting today for follow up of No chief complaint on file.   Dawn Wu has a history of the following: Patient Active Problem List   Diagnosis Date Noted  . Moderate persistent asthma, uncomplicated 123456  . PCOS (polycystic ovarian syndrome) 08/15/2017  . Hepatic steatosis 06/04/2017  . Large liver 06/04/2017  . Seasonal and perennial allergic rhinitis 11/25/2016  . Anaphylactic shock due to adverse food reaction 11/25/2016  . Urticaria, unspecified 04/07/2014  . Obesity (BMI 30.0-34.9) 04/07/2014    History obtained from: chart review and {Persons; PED relatives w/patient:19415::"patient"}.  Dawn Wu is a 36 y.o. female presenting for {Blank single:19197::"a food challenge","a drug challenge","skin testing","a sick visit","an evaluation of ***","a follow up visit"}.  {Blank single:19197::"Asthma/Respiratory Symptom History: ***"," "}  Allergic Rhinitis Symptom History: She remains on Allegra once daily.   {Blank single:19197::"Food Allergy Symptom History: ***"," "}  {Blank single:19197::"Skin Symptom History: ***"," "}  GERD Symptom History: She remains on omeprazole 20mg  daily.   Otherwise, there have been no changes to her past medical history, surgical history, family history, or social history.    ROS     Objective:   currently breastfeeding. There is no height or weight on file to calculate BMI.    Physical Exam   Diagnostic studies: {Blank single:19197::"none","deferred due to recent  antihistamine use","labs sent instead"," "}  Spirometry: {Blank single:19197::"results normal (FEV1: ***%, FVC: ***%, FEV1/FVC: ***%)","results abnormal (FEV1: ***%, FVC: ***%, FEV1/FVC: ***%)"}.    {Blank single:19197::"Spirometry consistent with mild obstructive disease","Spirometry consistent with moderate obstructive disease","Spirometry consistent with severe obstructive disease","Spirometry consistent with possible restrictive disease","Spirometry consistent with mixed obstructive and restrictive disease","Spirometry uninterpretable due to technique","Spirometry consistent with normal pattern"}. {Blank single:19197::"Albuterol/Atrovent nebulizer","Xopenex/Atrovent nebulizer","Albuterol nebulizer","Albuterol four puffs via MDI","Xopenex four puffs via MDI"} treatment given in clinic with {Blank single:19197::"significant improvement in FEV1 per ATS criteria","significant improvement in FVC per ATS criteria","significant improvement in FEV1 and FVC per ATS criteria","improvement in FEV1, but not significant per ATS criteria","improvement in FVC, but not significant per ATS criteria","improvement in FEV1 and FVC, but not significant per ATS criteria","no improvement"}.  Allergy Studies: {Blank single:19197::"none","labs sent instead"," "}    {Blank single:19197::"Allergy testing results were read and interpreted by myself, documented by clinical staff."," "}      Salvatore Marvel, MD  [facility]

## 2022-04-04 NOTE — Patient Instructions (Addendum)
1. Seasonal and perennial allergic rhinitis - Continue with Allegra daily.  - Continue with fluticasone one spray per nostril twice daily as needed.  - Continue with ipratropium one spray per nostril every 6 hours as needed.   2. Anaphylactic shock due to food (shellfish)  - Continue with avoid shellfish. - EpiPen refilled today. - We are retesting the shellfish IgE levels.   3. Moderate persistent asthma, uncomplicated - Lung testing looked excellent.  - Daily controller medication(s): Pulmicort two puffs twice daily - Prior to physical activity: albuterol 2 puffs 10-15 minutes before physical activity. - Rescue medications: albuterol 4 puffs every 4-6 hours as needed - Changes during respiratory infections or worsening symptoms: Add on Symbicort 160/4.20mcg to 2 puffs twice daily for TWO WEEKS. - Asthma control goals:  * Full participation in all desired activities (may need albuterol before activity) * Albuterol use two time or less a week on average (not counting use with activity) * Cough interfering with sleep two time or less a month * Oral steroids no more than once a year * No hospitalizations  4. Return in about 1 year (around 04/05/2023).    Please inform us of any Emergency Department visits, hospitalizations, or changes in symptoms. Call us before going to the ED for breathing or allergy symptoms since we might be able to fit you in for a sick visit. Feel free to contact us anytime with any questions, problems, or concerns.  It was a pleasure to see you and your family again today!  Websites that have reliable patient information: 1. American Academy of Asthma, Allergy, and Immunology: www.aaaai.org 2. Food Allergy Research and Education (FARE): foodallergy.org 3. Mothers of Asthmatics: http://www.asthmacommunitynetwork.org 4. American College of Allergy, Asthma, and Immunology: www.acaai.org   COVID-19 Vaccine Information can be found at:  [email or web address] For questions related to vaccine distribution or appointments, please email [email or web address] or call [phone].   We realize that you might be concerned about having an allergic reaction to the COVID19 vaccines. To help with that concern, WE ARE OFFERING THE COVID19 VACCINES IN OUR OFFICE! Ask the front desk for dates!     "Like" [name] on Facebook and Instagram for our latest updates!      A healthy democracy works best when [employer] participate! Make sure you are registered to vote! If you have moved or changed any of your contact information, you will need to get this updated before voting!  In some cases, you MAY be able to register to vote online: [email or web address]

## 2022-04-05 ENCOUNTER — Encounter: Payer: Self-pay | Admitting: Allergy & Immunology

## 2022-04-05 MED ORDER — ALBUTEROL SULFATE HFA 108 (90 BASE) MCG/ACT IN AERS: 2.0000 | INHALATION_SPRAY | Freq: Four times a day (QID) | RESPIRATORY_TRACT | 1 refills | Status: DC | PRN

## 2022-04-07 LAB — ALLERGEN PROFILE, SHELLFISH
Clam IgE: 1.13 kU/L — AB
F023-IgE Crab: 2.57 kU/L — AB
F080-IgE Lobster: 2.97 kU/L — AB
F290-IgE Oyster: 1.02 kU/L — AB
Scallop IgE: 1.72 kU/L — AB
Shrimp IgE: 3.28 kU/L — AB

## 2022-09-23 ENCOUNTER — Other Ambulatory Visit: Payer: Self-pay

## 2022-09-23 MED ORDER — ALBUTEROL SULFATE HFA 108 (90 BASE) MCG/ACT IN AERS
2.0000 | INHALATION_SPRAY | Freq: Four times a day (QID) | RESPIRATORY_TRACT | 1 refills | Status: DC | PRN
Start: 2022-09-23 — End: 2023-01-30

## 2023-01-30 ENCOUNTER — Other Ambulatory Visit: Payer: Self-pay | Admitting: Allergy and Immunology

## 2023-05-29 ENCOUNTER — Ambulatory Visit: Payer: Managed Care, Other (non HMO) | Admitting: Allergy & Immunology
